# Patient Record
Sex: Female | Born: 1935 | Race: White | Hispanic: No | Marital: Married | Smoking: Never smoker
Health system: Southern US, Community
[De-identification: ages and names within clinical notes are randomized; demographics above are authoritative.]

## PROBLEM LIST (undated history)

## (undated) DIAGNOSIS — I10 Essential (primary) hypertension: Secondary | ICD-10-CM

## (undated) DIAGNOSIS — R200 Anesthesia of skin: Secondary | ICD-10-CM

## (undated) DIAGNOSIS — M2550 Pain in unspecified joint: Secondary | ICD-10-CM

## (undated) DIAGNOSIS — E785 Hyperlipidemia, unspecified: Secondary | ICD-10-CM

## (undated) DIAGNOSIS — K5790 Diverticulosis of intestine, part unspecified, without perforation or abscess without bleeding: Secondary | ICD-10-CM

## (undated) DIAGNOSIS — D649 Anemia, unspecified: Secondary | ICD-10-CM

## (undated) DIAGNOSIS — M8580 Other specified disorders of bone density and structure, unspecified site: Secondary | ICD-10-CM

## (undated) DIAGNOSIS — Z9889 Other specified postprocedural states: Secondary | ICD-10-CM

## (undated) DIAGNOSIS — H3530 Unspecified macular degeneration: Secondary | ICD-10-CM

## (undated) DIAGNOSIS — R0989 Other specified symptoms and signs involving the circulatory and respiratory systems: Secondary | ICD-10-CM

## (undated) DIAGNOSIS — K5900 Constipation, unspecified: Secondary | ICD-10-CM

## (undated) DIAGNOSIS — K219 Gastro-esophageal reflux disease without esophagitis: Secondary | ICD-10-CM

## (undated) DIAGNOSIS — K648 Other hemorrhoids: Secondary | ICD-10-CM

## (undated) DIAGNOSIS — Z8669 Personal history of other diseases of the nervous system and sense organs: Secondary | ICD-10-CM

## (undated) DIAGNOSIS — R351 Nocturia: Secondary | ICD-10-CM

## (undated) DIAGNOSIS — R3915 Urgency of urination: Secondary | ICD-10-CM

## (undated) DIAGNOSIS — M2540 Effusion, unspecified joint: Secondary | ICD-10-CM

## (undated) DIAGNOSIS — I809 Phlebitis and thrombophlebitis of unspecified site: Secondary | ICD-10-CM

## (undated) DIAGNOSIS — R350 Frequency of micturition: Secondary | ICD-10-CM

## (undated) DIAGNOSIS — E039 Hypothyroidism, unspecified: Secondary | ICD-10-CM

## (undated) DIAGNOSIS — M1990 Unspecified osteoarthritis, unspecified site: Secondary | ICD-10-CM

## (undated) DIAGNOSIS — M549 Dorsalgia, unspecified: Secondary | ICD-10-CM

## (undated) DIAGNOSIS — R112 Nausea with vomiting, unspecified: Secondary | ICD-10-CM

## (undated) HISTORY — PX: DILATION AND CURETTAGE OF UTERUS: SHX78

## (undated) HISTORY — PX: OOPHORECTOMY: SHX86

## (undated) HISTORY — PX: OTHER SURGICAL HISTORY: SHX169

## (undated) HISTORY — PX: COLONOSCOPY: SHX174

---
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
## 1994-05-21 HISTORY — PX: OTHER SURGICAL HISTORY: SHX169

## 1996-05-21 HISTORY — PX: OTHER SURGICAL HISTORY: SHX169

## 1997-12-19 HISTORY — PX: BREAST BIOPSY: SHX20

## 2004-05-08 ENCOUNTER — Ambulatory Visit: Payer: Self-pay | Admitting: Family Medicine

## 2004-05-30 ENCOUNTER — Ambulatory Visit: Payer: Self-pay | Admitting: Unknown Physician Specialty

## 2005-03-06 ENCOUNTER — Ambulatory Visit: Payer: Self-pay | Admitting: Family Medicine

## 2005-03-06 IMAGING — US US CAROTID DUPLEX BILAT
1 series · 17 of 24 positions shown · non-contrast
Comparison: none

REASON FOR EXAM: Bruit, follow-up carotid occlusion
COMMENTS:

PROCEDURE:     US  - US CAROTID DOPPLER BILATERAL  - [DATE] [DATE]
RESULT:   Doppler interrogation of the carotid arteries is performed in the
standard fashion.

[Series 1: us carotid duplex bilat · 17 of 92 slices shown]
[im 1/92]
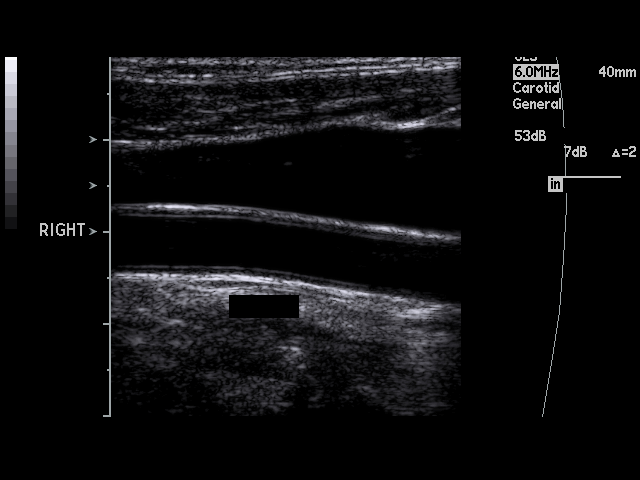
[im 8/92]
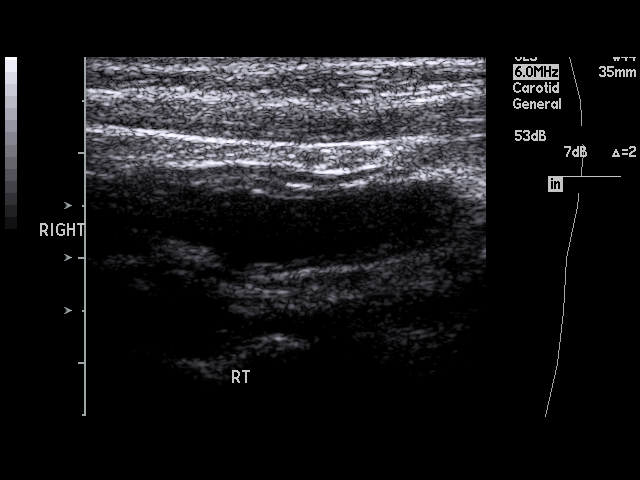
[im 12/92]
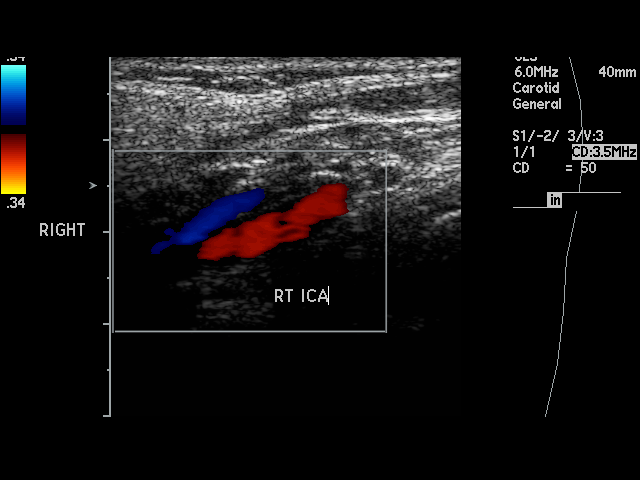
[im 16/92]
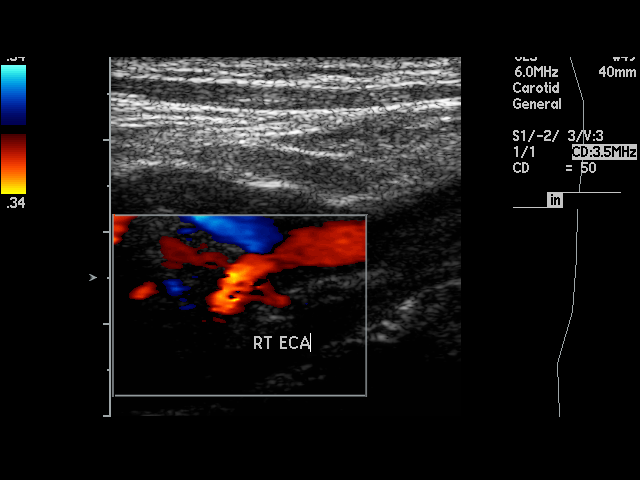
[im 24/92]
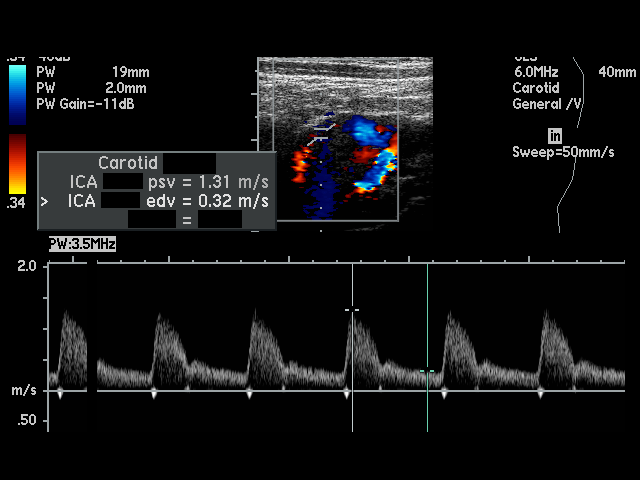
[im 28/92]
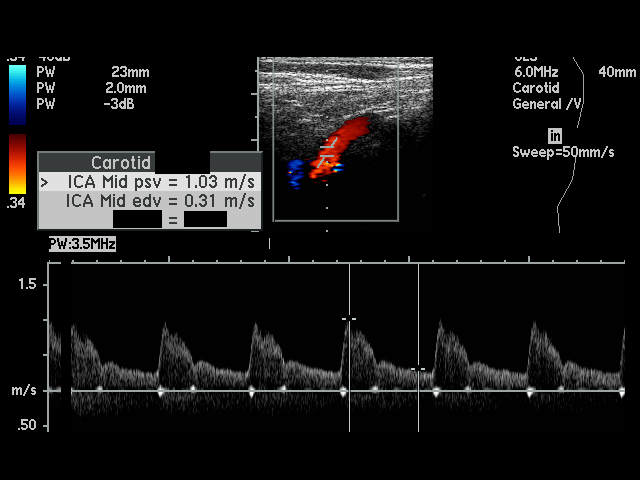
[im 36/92]
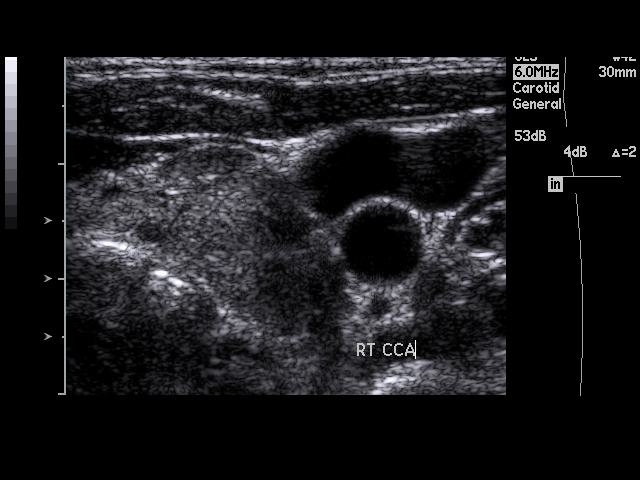
[im 40/92]
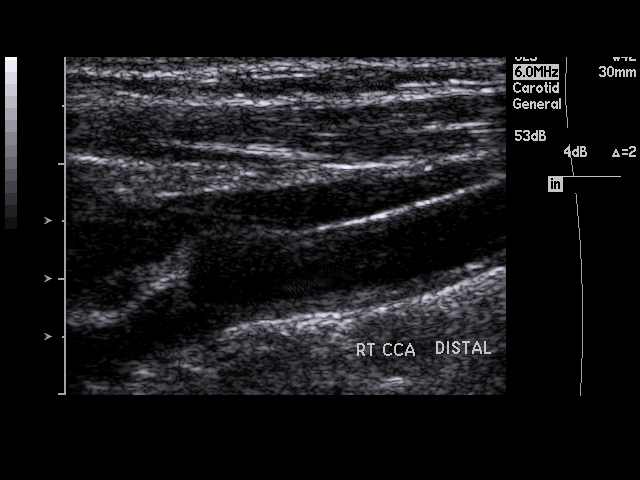
[im 48/92]
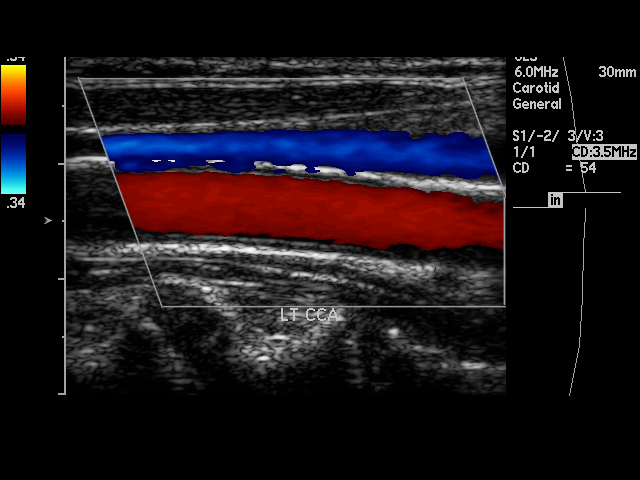
[im 52/92]
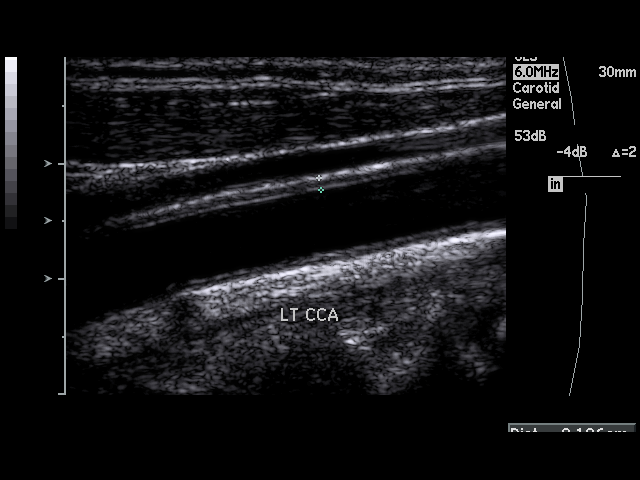
[im 56/92]
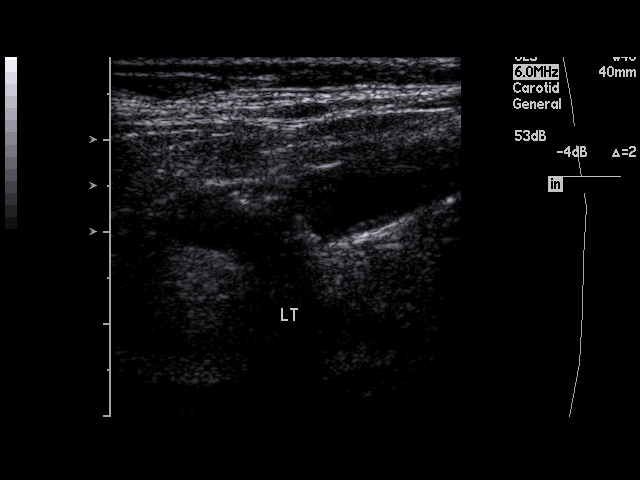
[im 64/92]
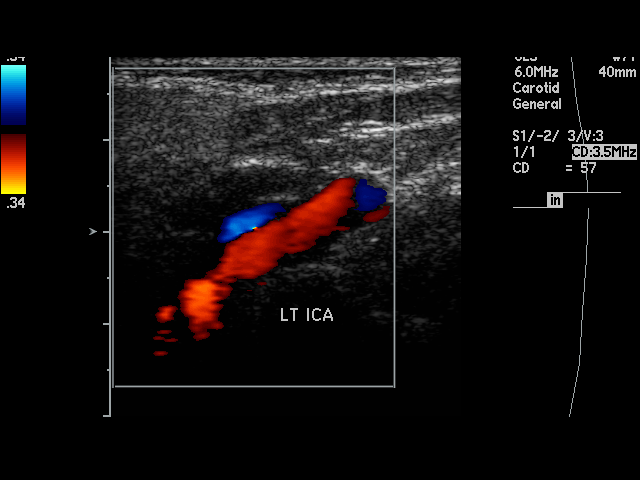
[im 68/92]
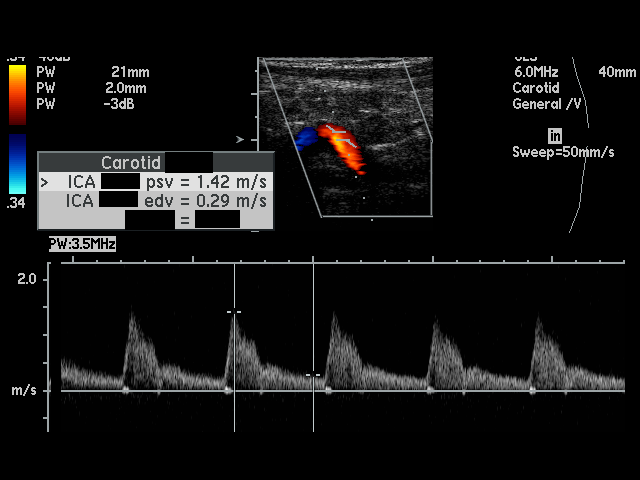
[im 76/92]
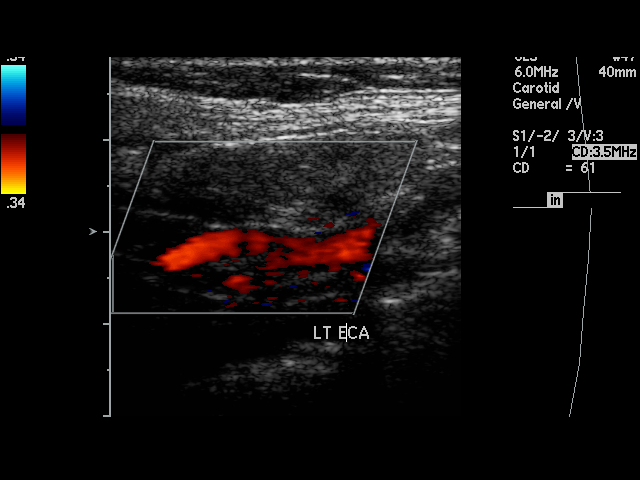
[im 80/92]
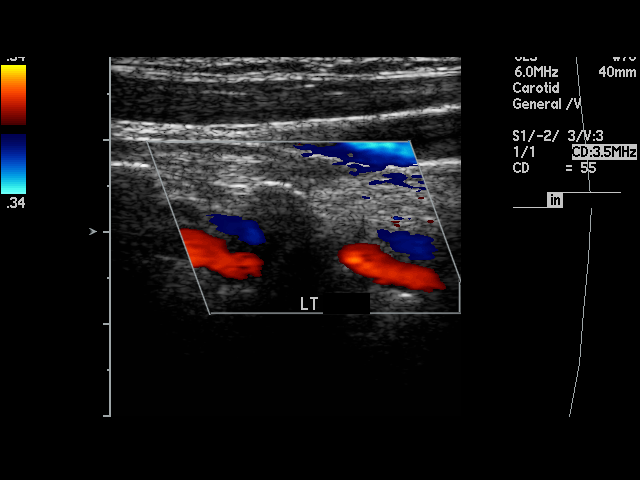
[im 84/92]
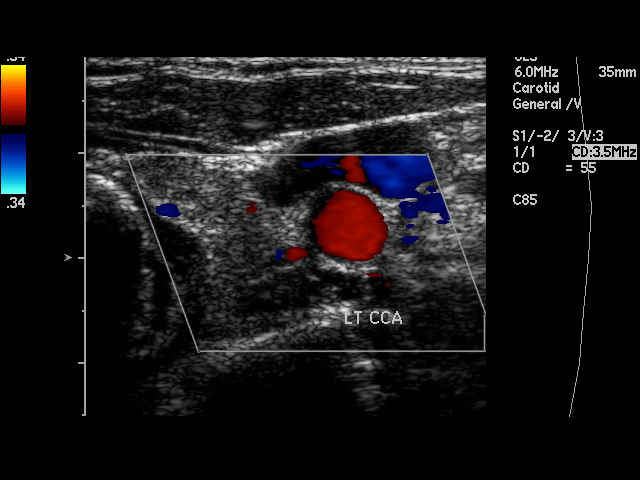
[im 92/92]
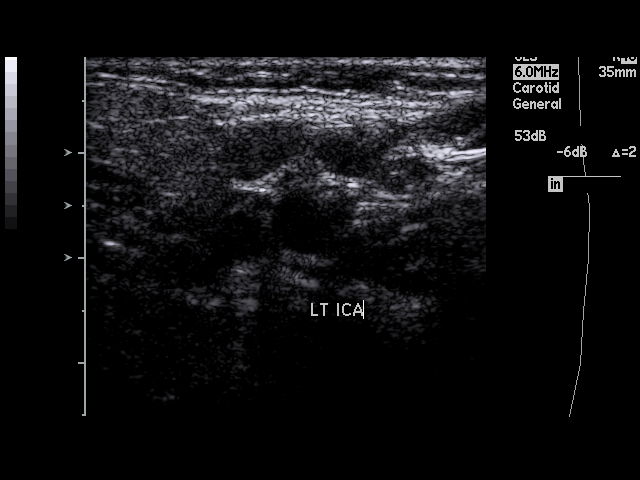

[17 of 24 positions shown; findings below may reference images not displayed]

FINDINGS: The images demonstrate areas of calcified plaque especially in the
LEFT carotid bifurcation and internal carotid artery. Some soft,
noncalcified plaque is seen in the RIGHT carotid bulb. Visually these
changes do not appear to cause significant stenosis. Antegrade flow is seen
in both vertebral arteries. The peak systolic velocity ratios are 1.7 on the
RIGHT and 1.02 on the LEFT.
IMPRESSION: Some atherosclerotic calcification, worse on the LEFT than
the RIGHT, and some soft plaque on the RIGHT without definite
hemodynamically significant stenosis.

## 2005-05-17 ENCOUNTER — Ambulatory Visit: Payer: Self-pay | Admitting: Family Medicine

## 2005-10-19 ENCOUNTER — Ambulatory Visit: Payer: Self-pay | Admitting: Family Medicine

## 2005-10-19 IMAGING — CR DG LUMBAR SPINE AP/LAT/OBLIQUES W/ FLEX AND EXT
1 series · 6 of 6 positions shown · non-contrast
Comparison: none

REASON FOR EXAM: Pain
COMMENTS:

[Series 1: view not recorded · 0.17mm/px · 6 of 6 slices shown]
[im 1/6]
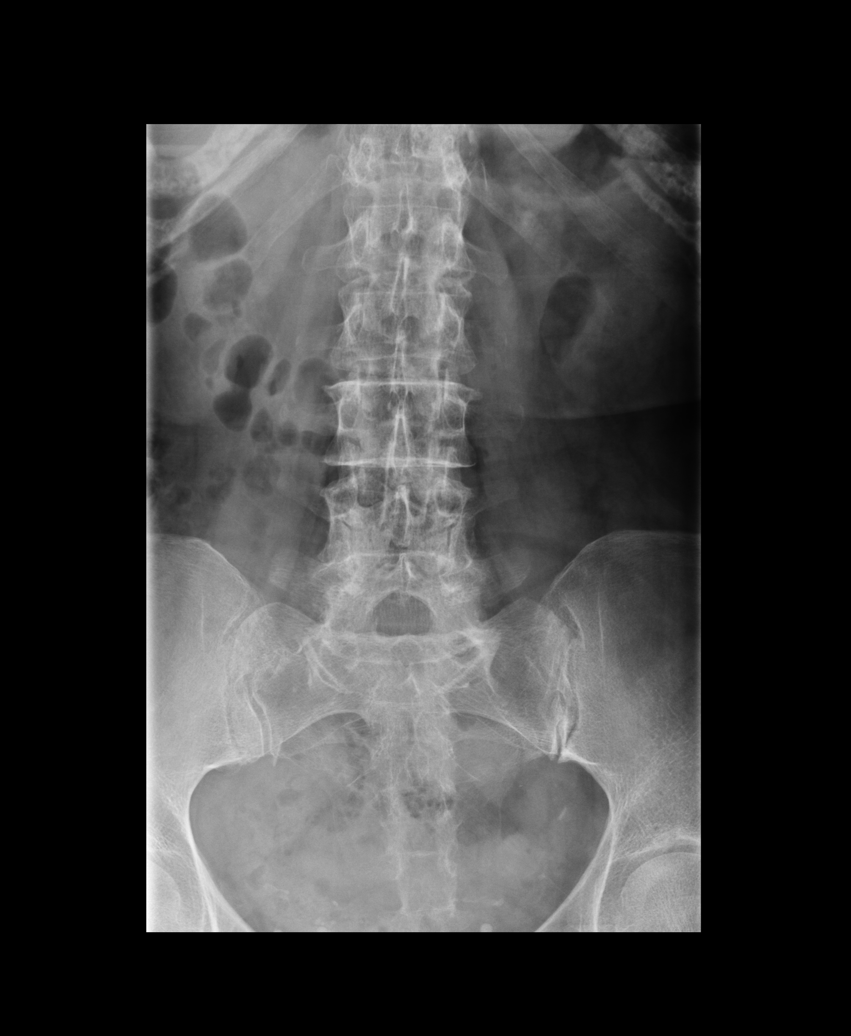
[im 2/6]
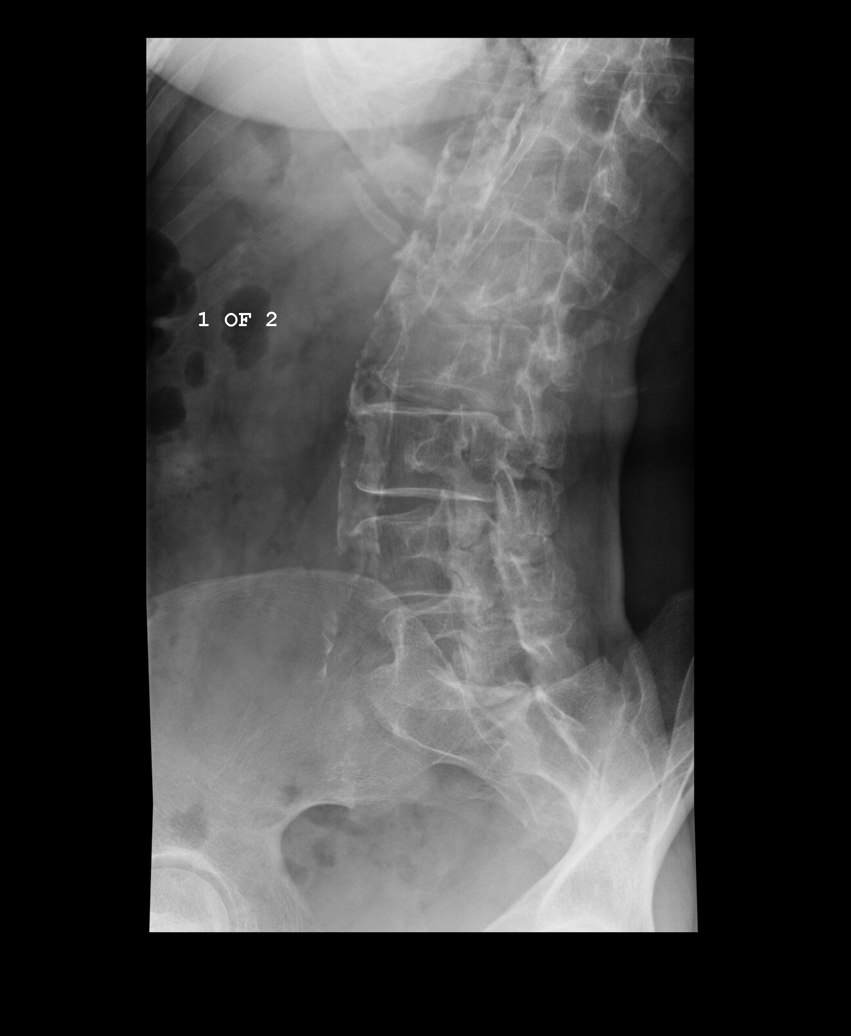
[im 3/6]
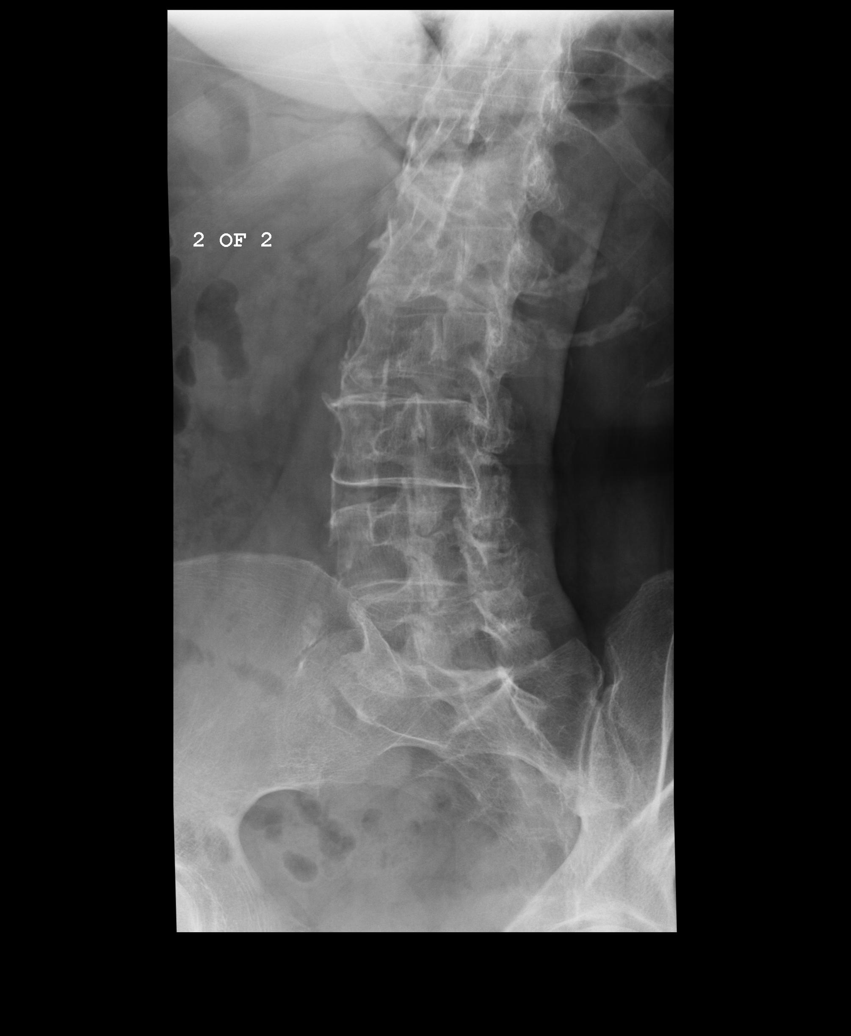
[im 4/6]
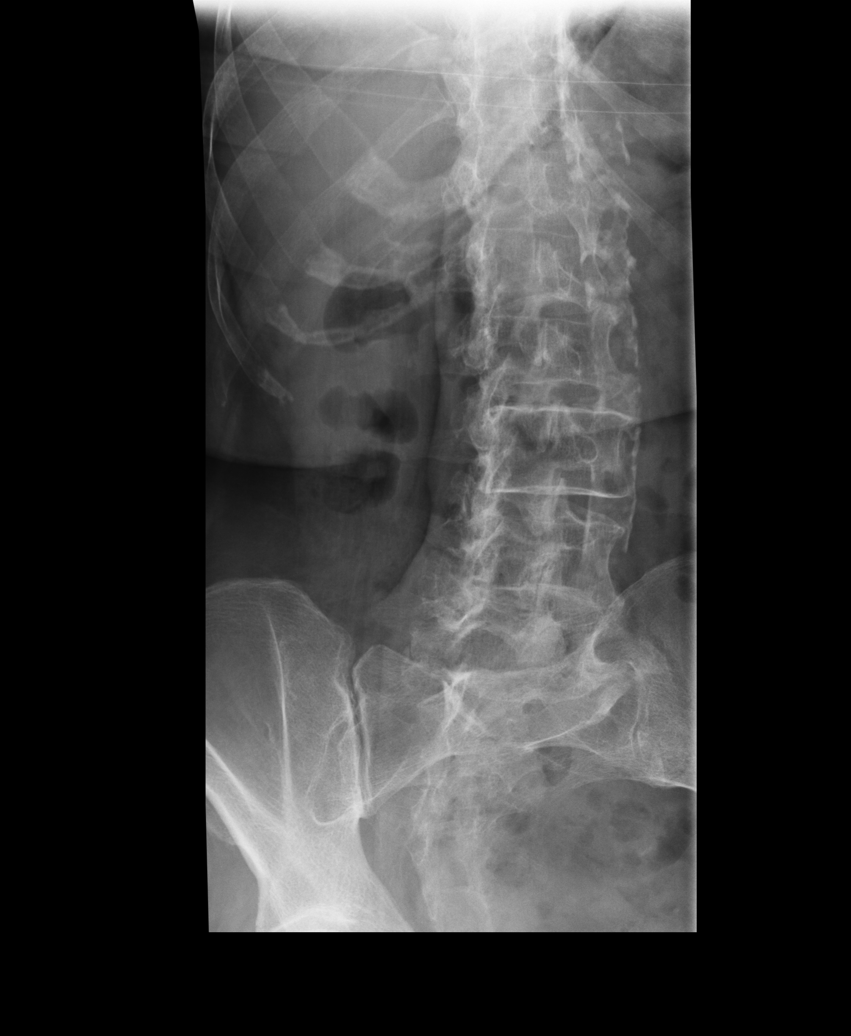
[im 5/6]
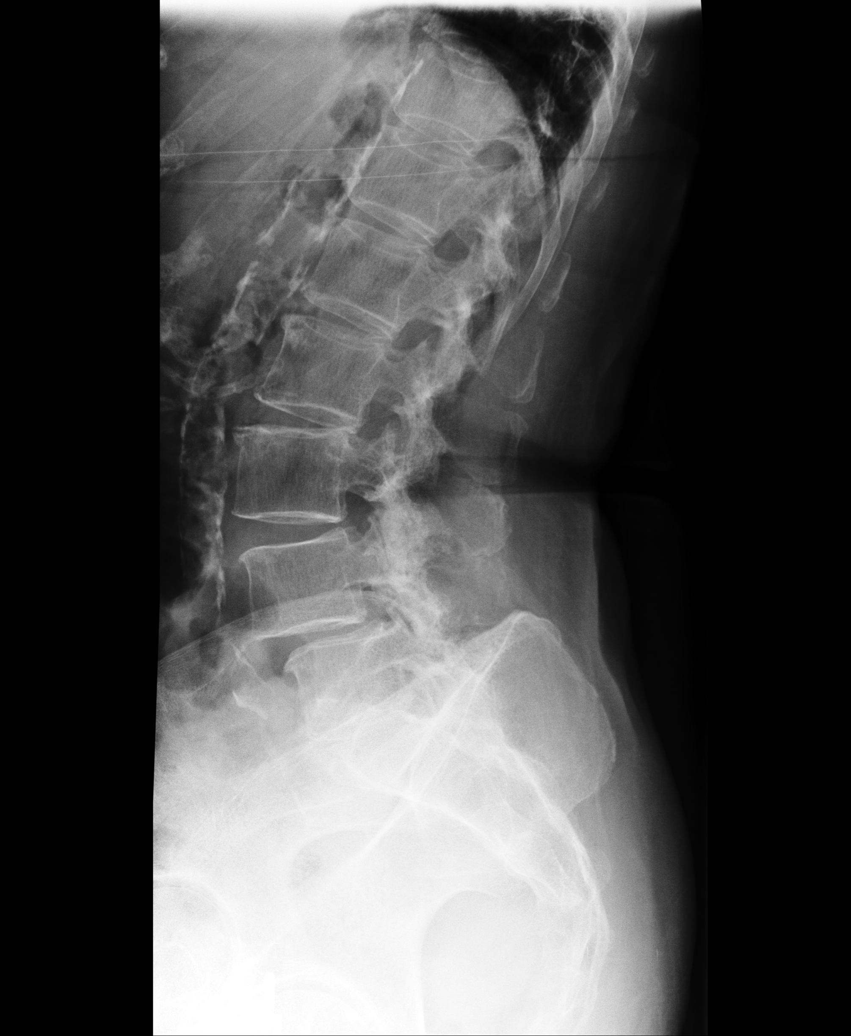
[im 6/6]
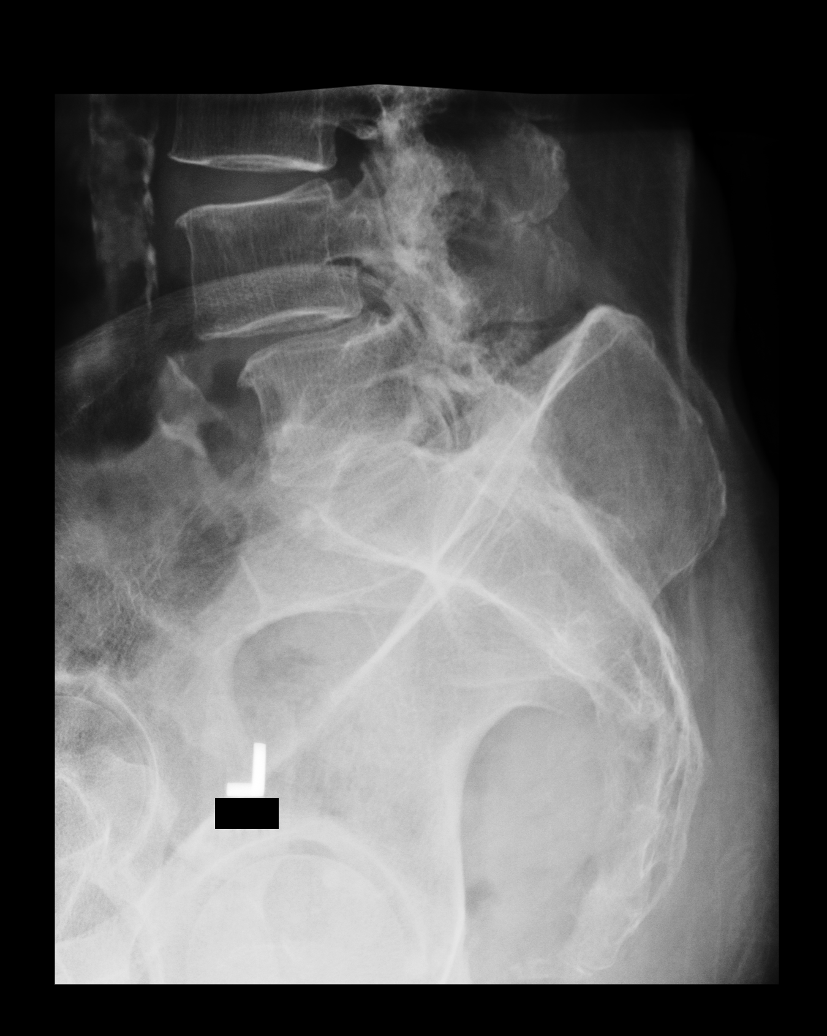

[6 of 6 positions shown; findings below may reference images not displayed]

PROCEDURE:     DXR - DXR LUMBAR SPINE WITH OBLIQUES  - [DATE]  [DATE]

RESULT:          AP, lateral and oblique views of the lumbar spine show the
vertebral body heights to be well maintained.  There is approximately 1.2 cm
anterolisthesis of L4 on L5.  No associated fracture is seen, which suggests
this finding is on a degenerative basis.  There is noted narrowing of the
L4-L5 disc space compatible with disc disease.  Also noted is narrowing of
the L5-S1 disc space consistent with disc disease.  There is sclerosis of
the articular facets at the L4-L5-S1 level compatible with arthritic change.
 The pedicles are bilaterally intact.
IMPRESSION: 1.     No fracture is seen.
2.     There is spondylolisthesis without spondylolysis at L4-L5 as noted
above.
3.     There is narrowing of the intervertebral disc spaces at L4-L5 and
L5-S1 consistent with disc disease.
4.     There is sclerosis of the articular facets at L4-L5 and L5-S1
consistent with arthritic change.
5.     Incidental note is made of atherosclerotic calcification in the
abdominal aorta.

## 2006-01-29 ENCOUNTER — Ambulatory Visit: Payer: Self-pay | Admitting: Family Medicine

## 2006-01-29 IMAGING — US ABDOMEN ULTRASOUND
1 series · 17 of 25 positions shown · non-contrast
Comparison: none

REASON FOR EXAM: palpable liver edge eval pancreas liver spleen
COMMENTS:

[Series 1: abdomen ultrasound · 17 of 62 slices shown]
[im 1/62]
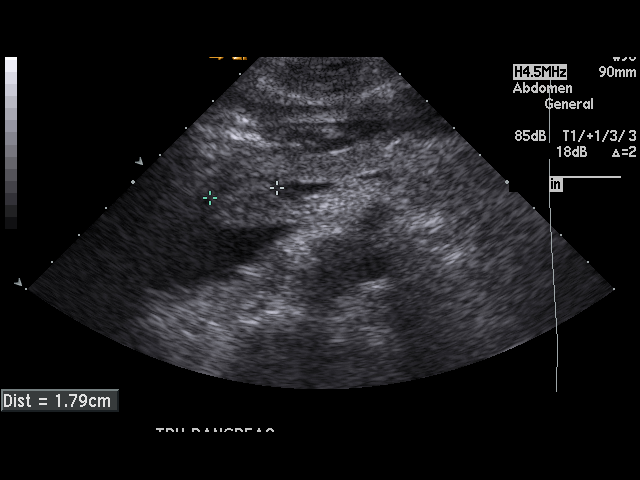
[im 6/62]
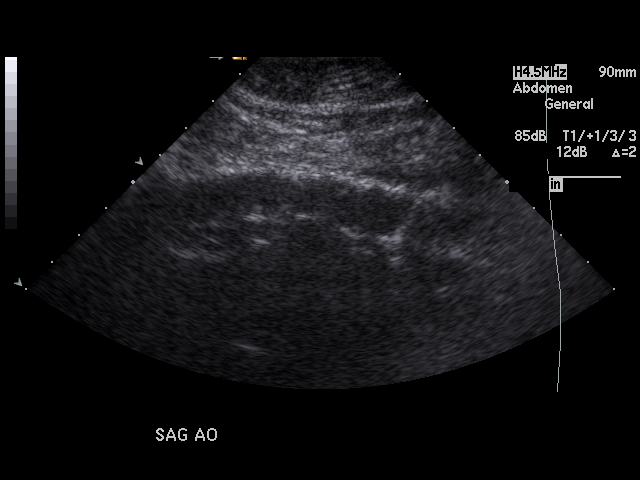
[im 8/62]
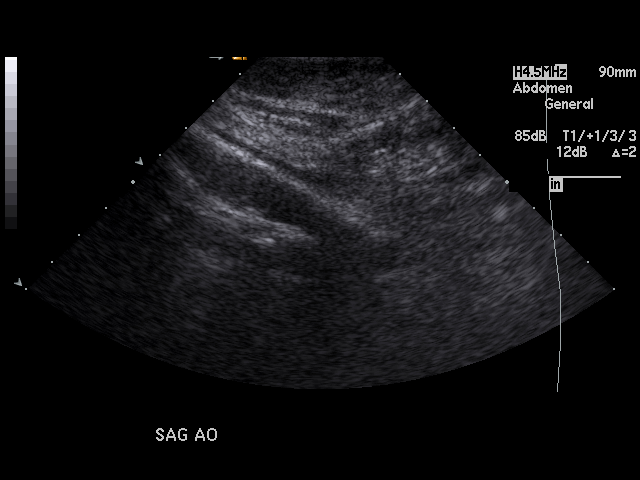
[im 13/62]
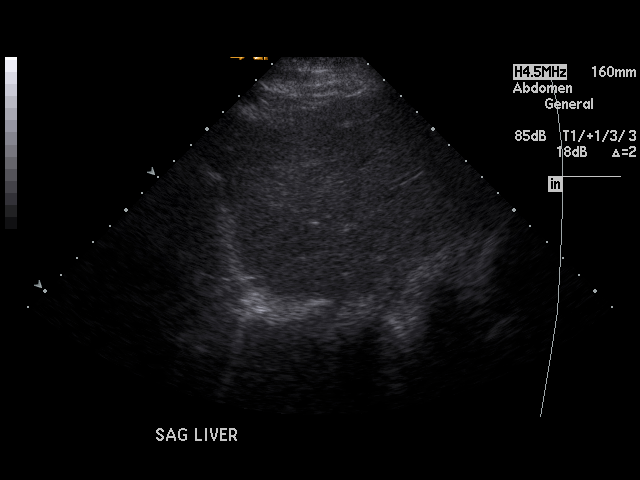
[im 16/62]
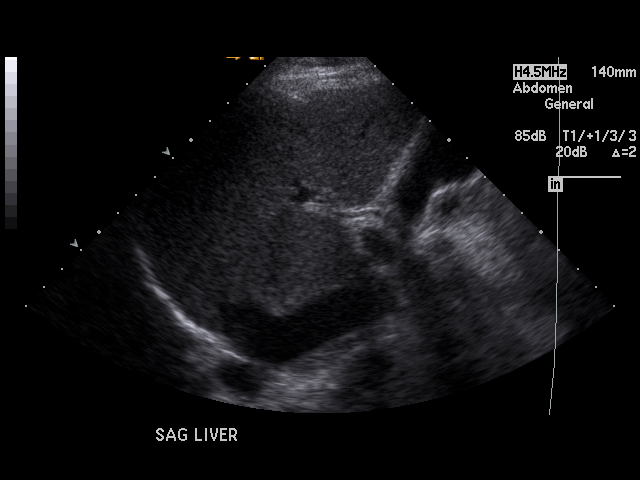
[im 21/62]
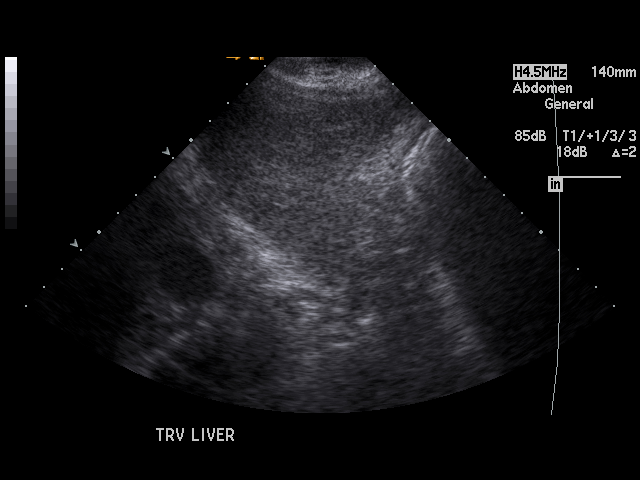
[im 23/62]
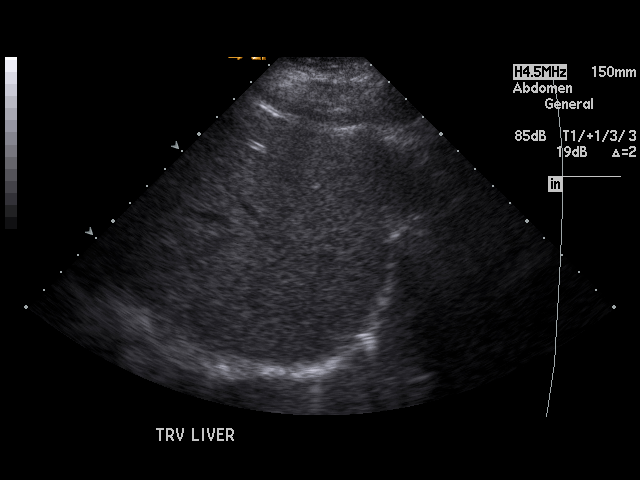
[im 28/62]
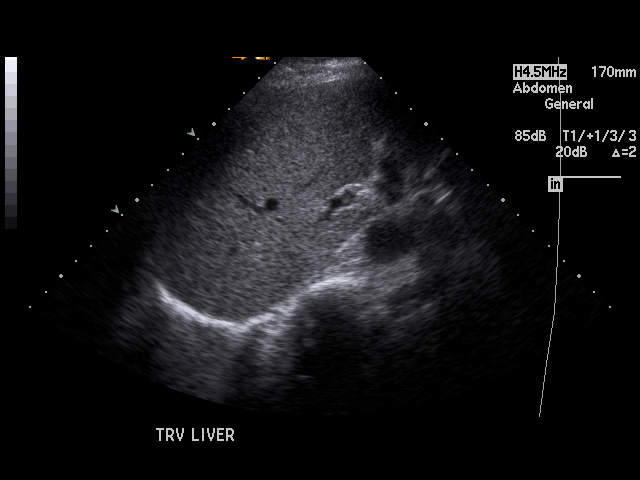
[im 31/62]
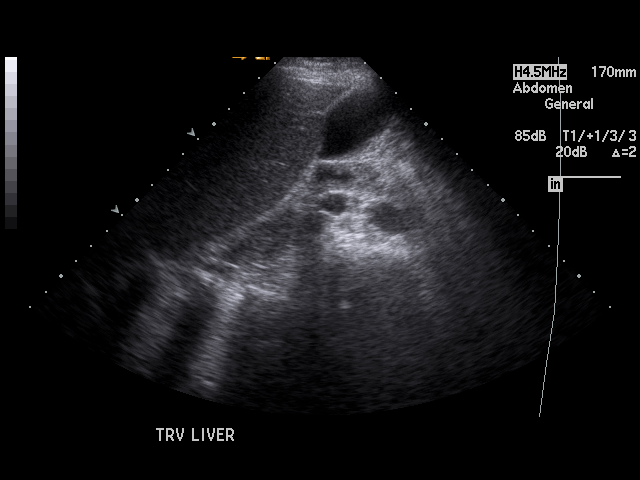
[im 34/62]
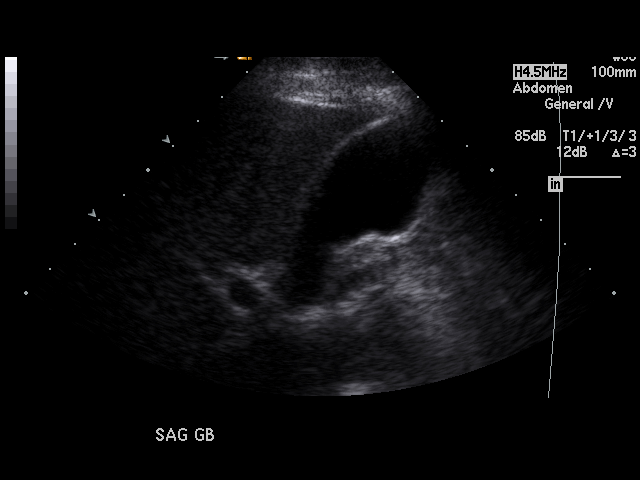
[im 39/62]
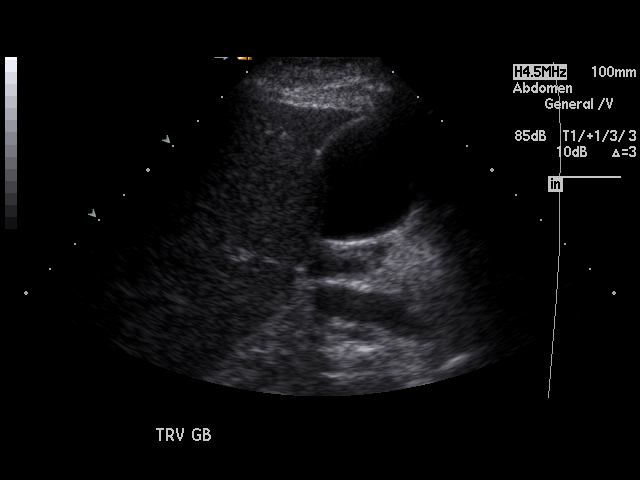
[im 41/62]
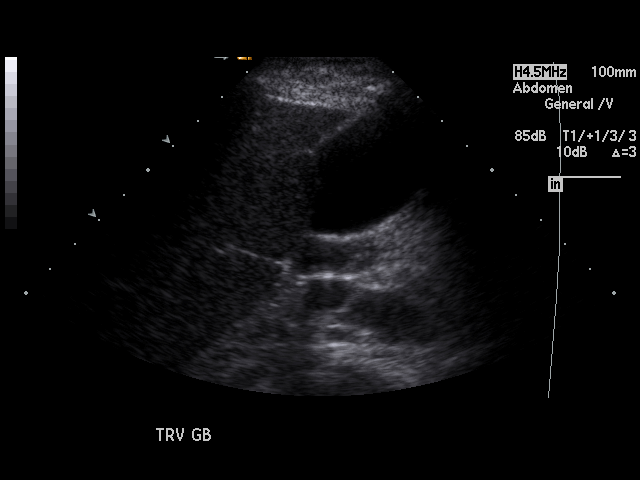
[im 46/62]
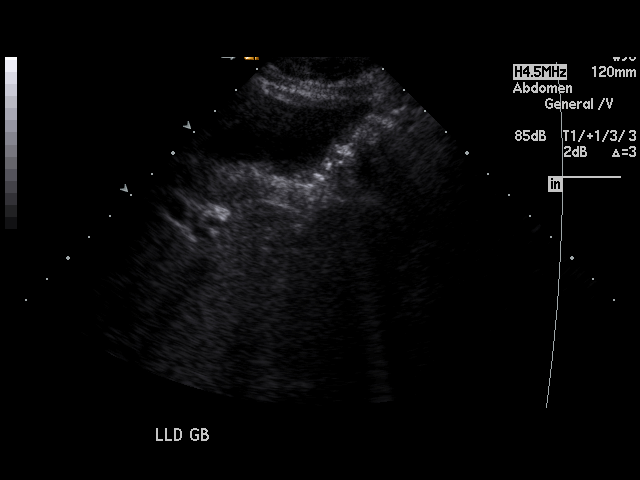
[im 49/62]
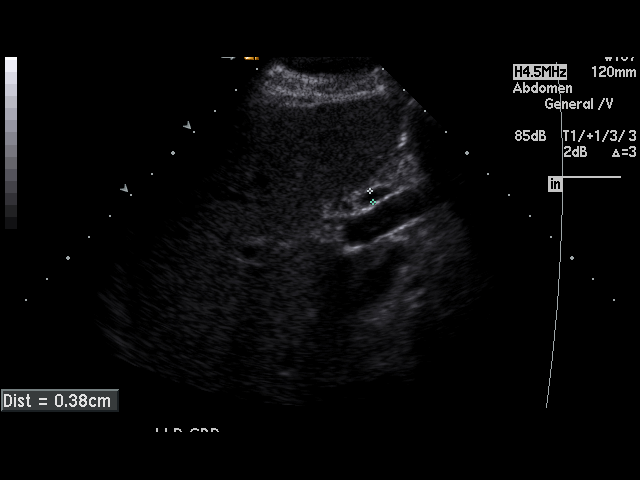
[im 54/62]
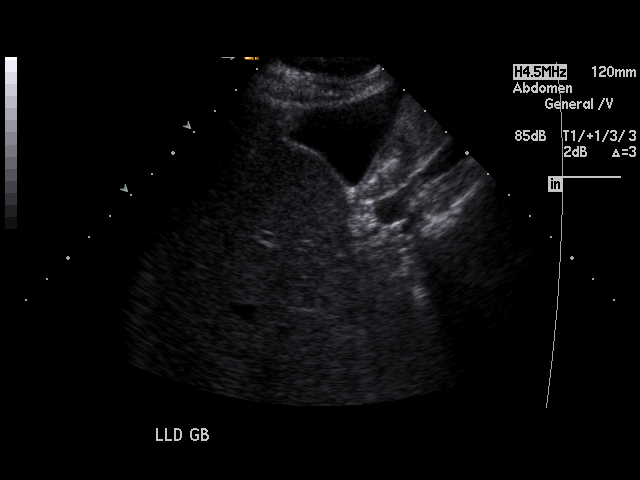
[im 56/62]
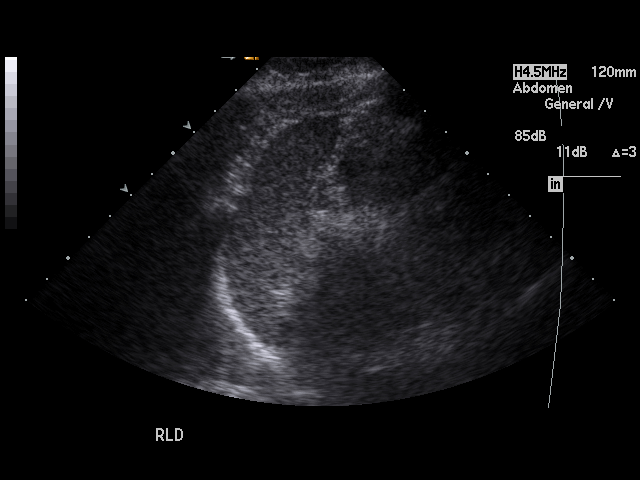
[im 62/62]
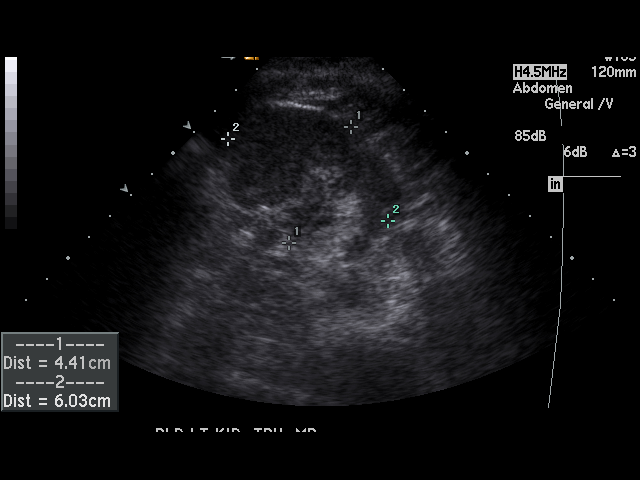

[17 of 25 positions shown; findings below may reference images not displayed]

PROCEDURE:     US  - US ABDOMEN GENERAL SURVEY  - [DATE]  [DATE]

RESULT:     The liver, spleen, and pancreas are normal in appearance. No
gallstones are seen. There is no thickening of the gallbladder wall. The
common bile duct measures 3.8 mm in diameter, which is within normal limits.
The kidneys show no hydronephrosis.  There is a 2.52 cm parapelvic cyst on
the LEFT.
IMPRESSION: 1)No significant sonographic abnormalities of the liver are identified.
Specifically, no hepatic mass is identified and the hepatic echo pattern is
homogenous.

2)No gallstones are seen.

3)There is no ascites.

4)Incidental note is made of a parapelvic cyst of the LEFT kidney.

## 2006-05-20 ENCOUNTER — Ambulatory Visit: Payer: Self-pay | Admitting: Family Medicine

## 2007-02-18 ENCOUNTER — Ambulatory Visit: Payer: Self-pay | Admitting: Family Medicine

## 2007-02-18 IMAGING — RF DG UGI W/ KUB
1 series · 15 of 24 positions shown · non-contrast
Comparison: none

REASON FOR EXAM: Dysphagia
COMMENTS:

[Series 1: run · 15 of 26 slices shown]
[im 1/26]
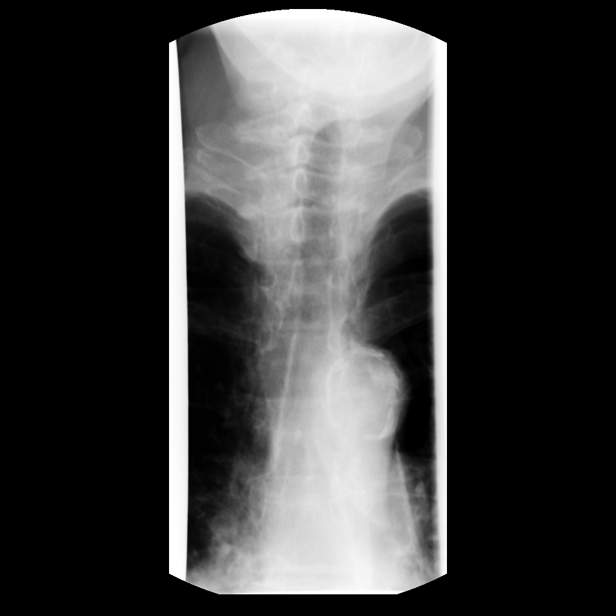
[im 3/26]
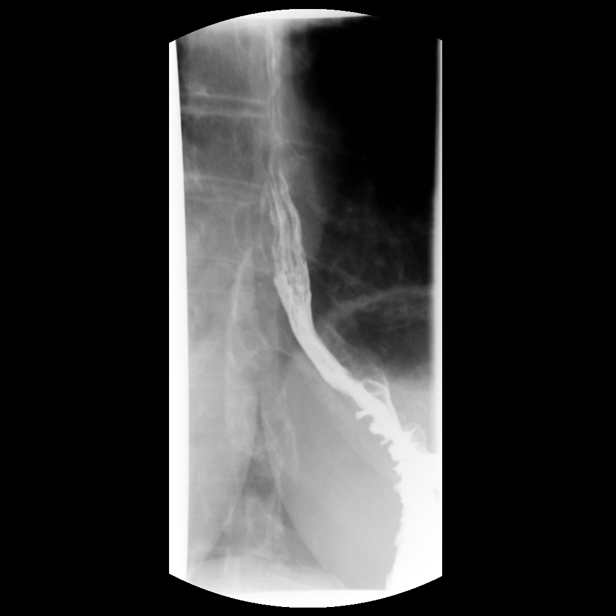
[im 5/26]
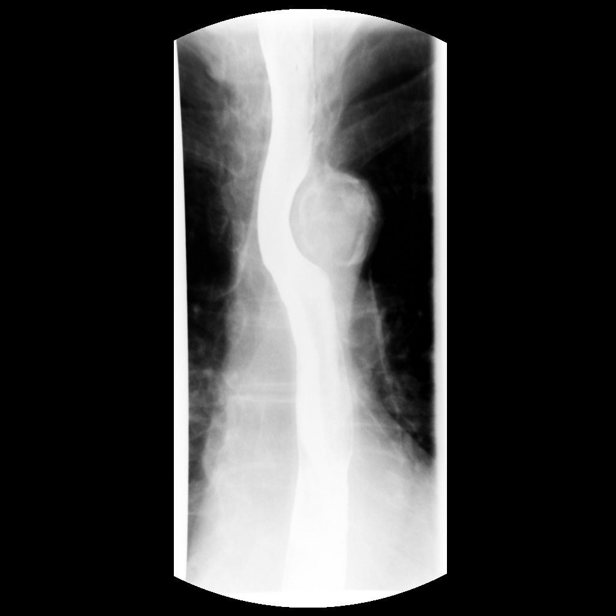
[im 6/26]
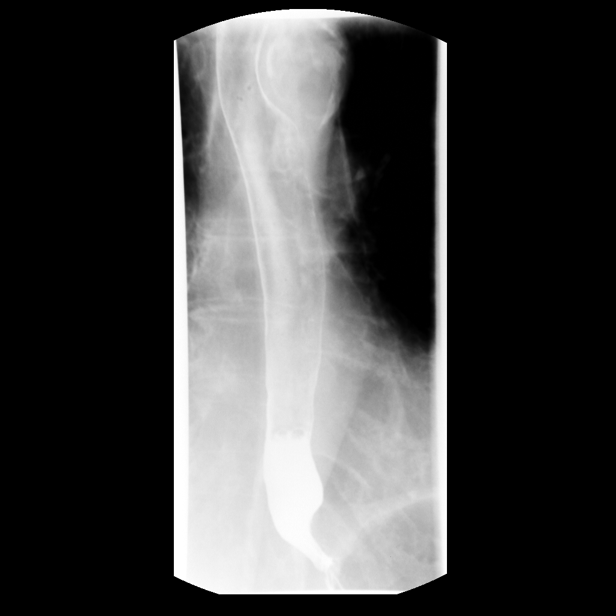
[im 8/26]
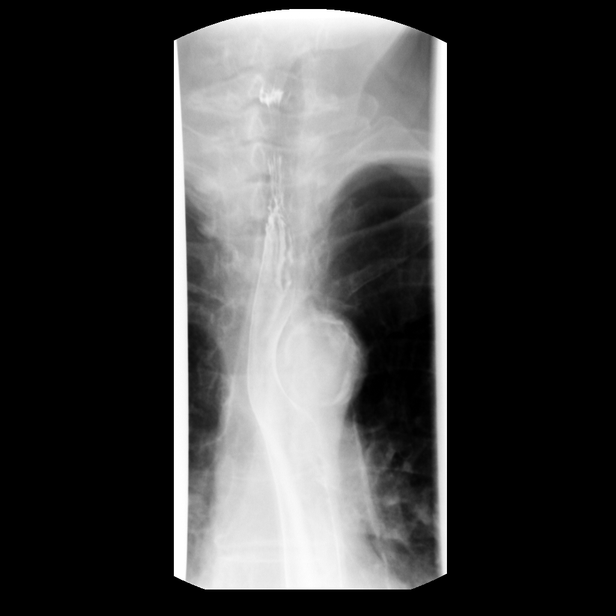
[im 9/26]
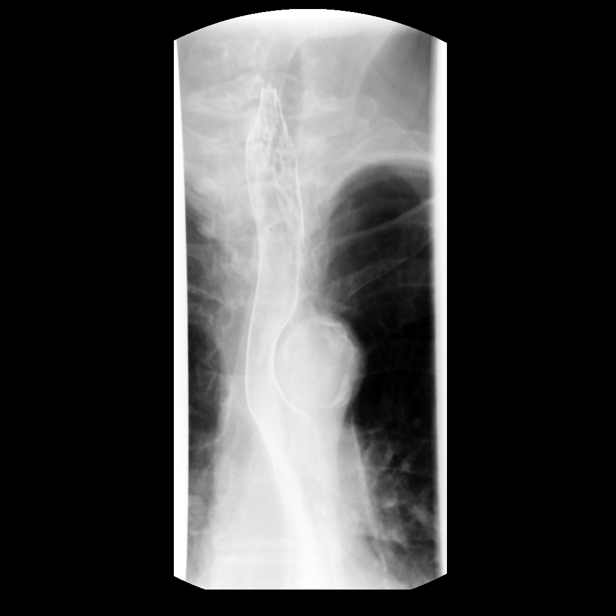
[im 11/26]
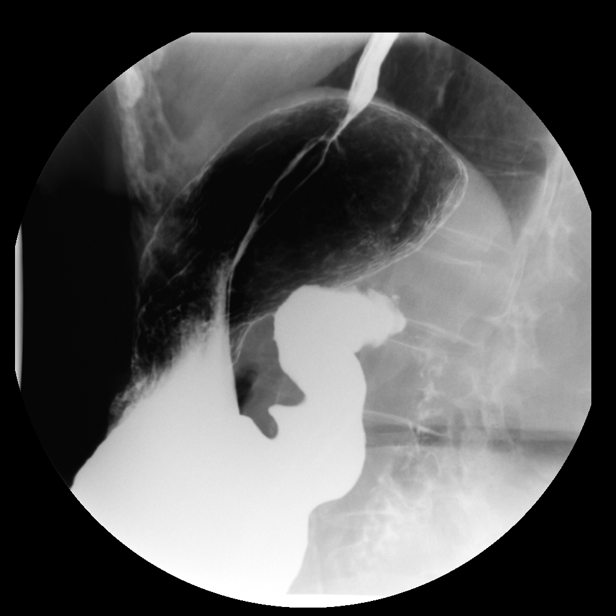
[im 14/26]
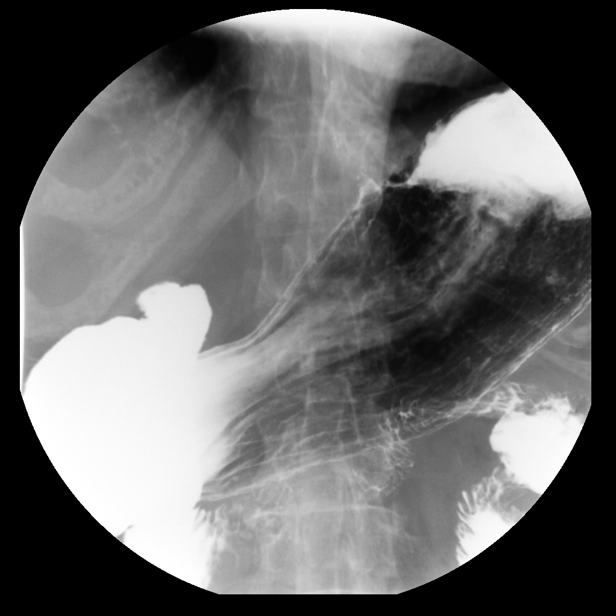
[im 15/26]
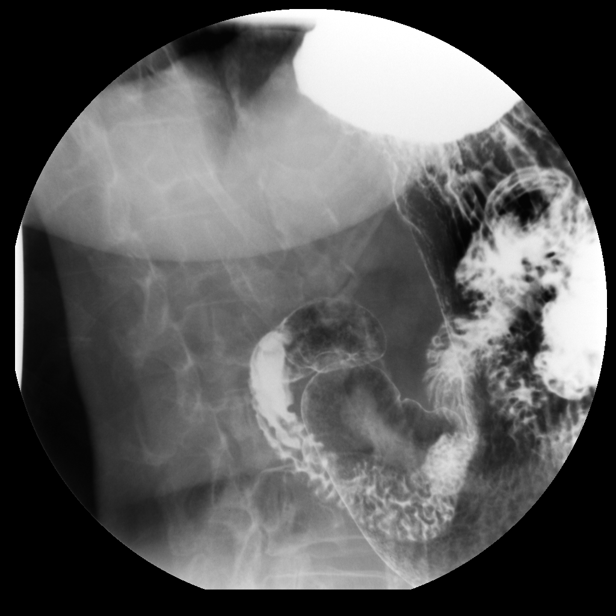
[im 17/26]
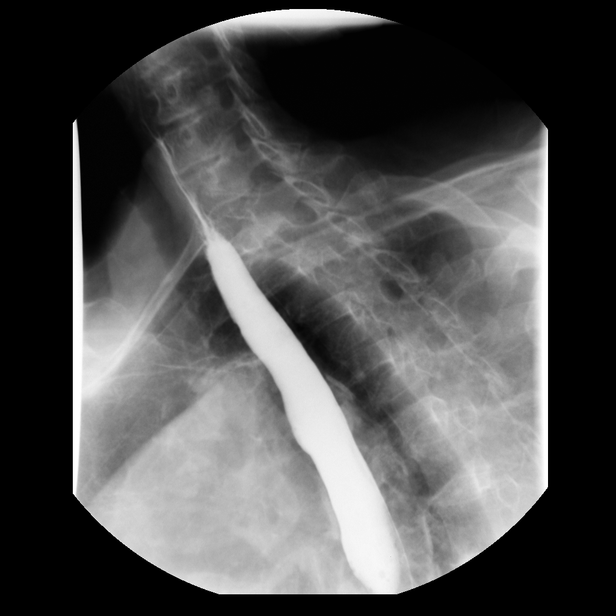
[im 18/26]
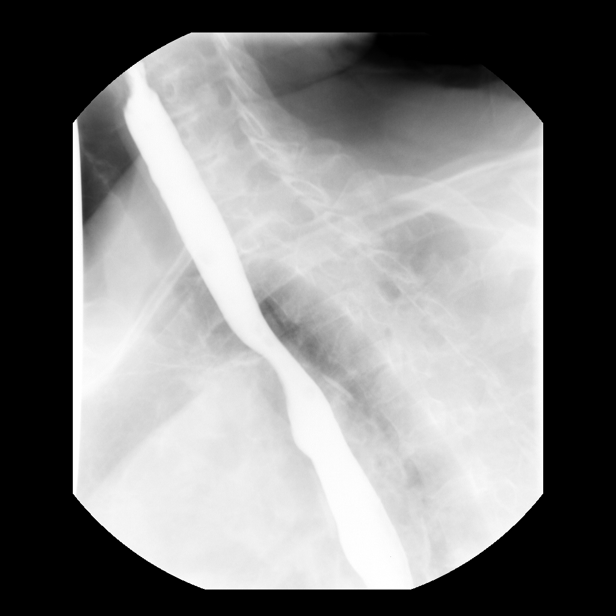
[im 20/26]
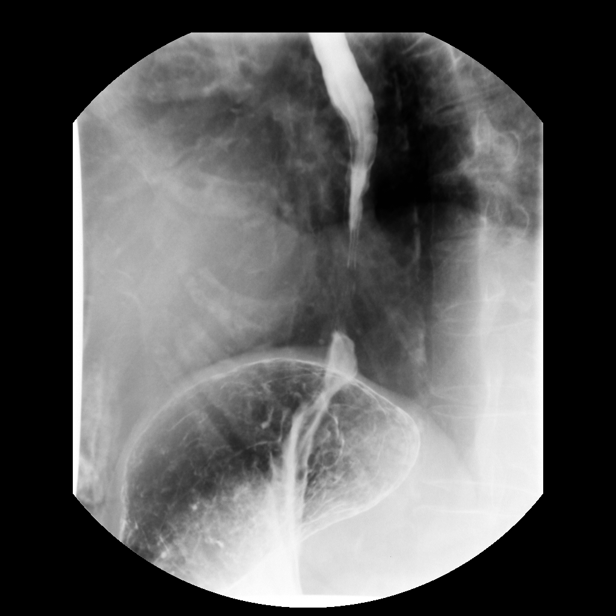
[im 22/26]
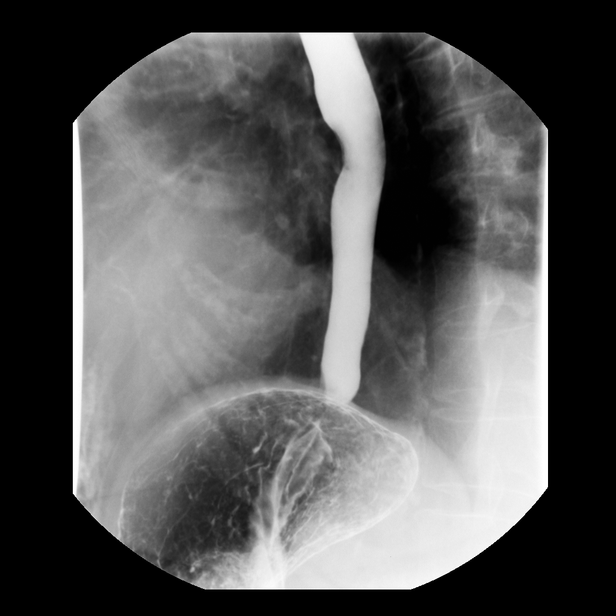
[im 23/26]
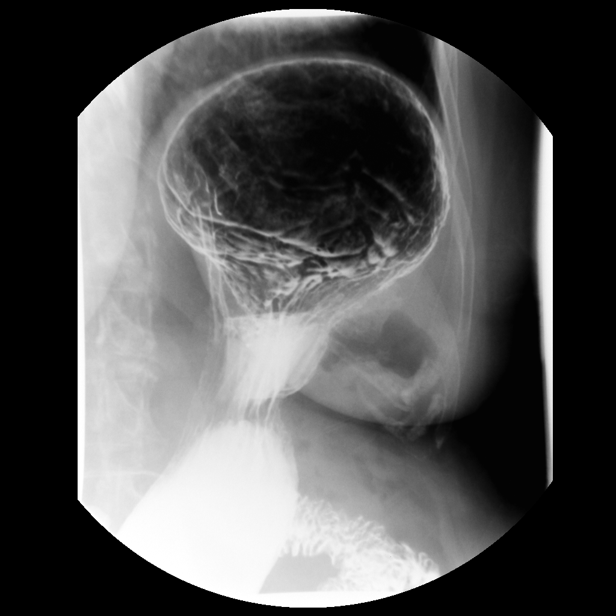
[im 26/26]
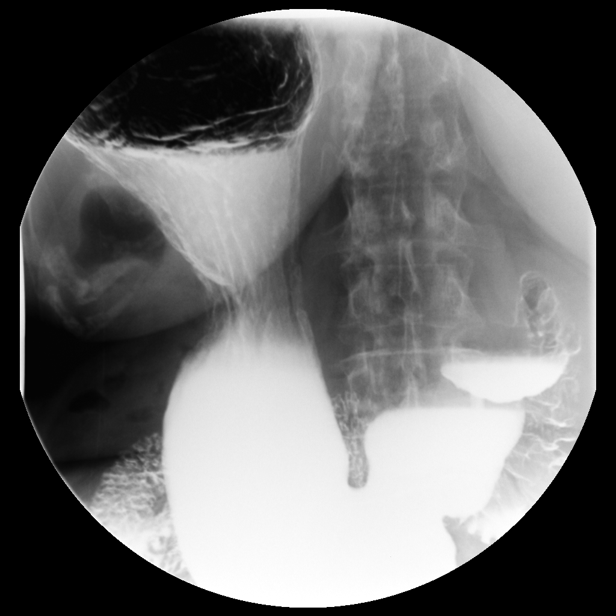

[15 of 24 positions shown; findings below may reference images not displayed]

PROCEDURE:     FL  - FL UPPER GI W/ BARIUM SWALLOW  - [DATE]  [DATE]

RESULT:     Upper GI with barium swallow was performed.

The patient easily ingested the liquid barium. The stomach distended fully.
The gastric and esophageal mucosa appears to be normal. There was no
evidence of a hiatal hernia or gastroesophageal reflux despite utilizing
water siphon test and reflux maneuvers. The 12.5 mm barium impregnated
tablet passed easily through the esophagus into the stomach.
IMPRESSION: Unremarkable barium swallow and upper GI examination. No
esophageal stenosis or mucosal abnormality. Unremarkable appearance of the
stomach and proximal small bowel. Atherosclerotic calcification is noted in
the aorta.

## 2007-02-18 IMAGING — CR DG UGI W/ KUB
1 series · 1 of 1 positions shown · non-contrast
Comparison: none

REASON FOR EXAM: Dysphagia
COMMENTS:

[view not recorded]
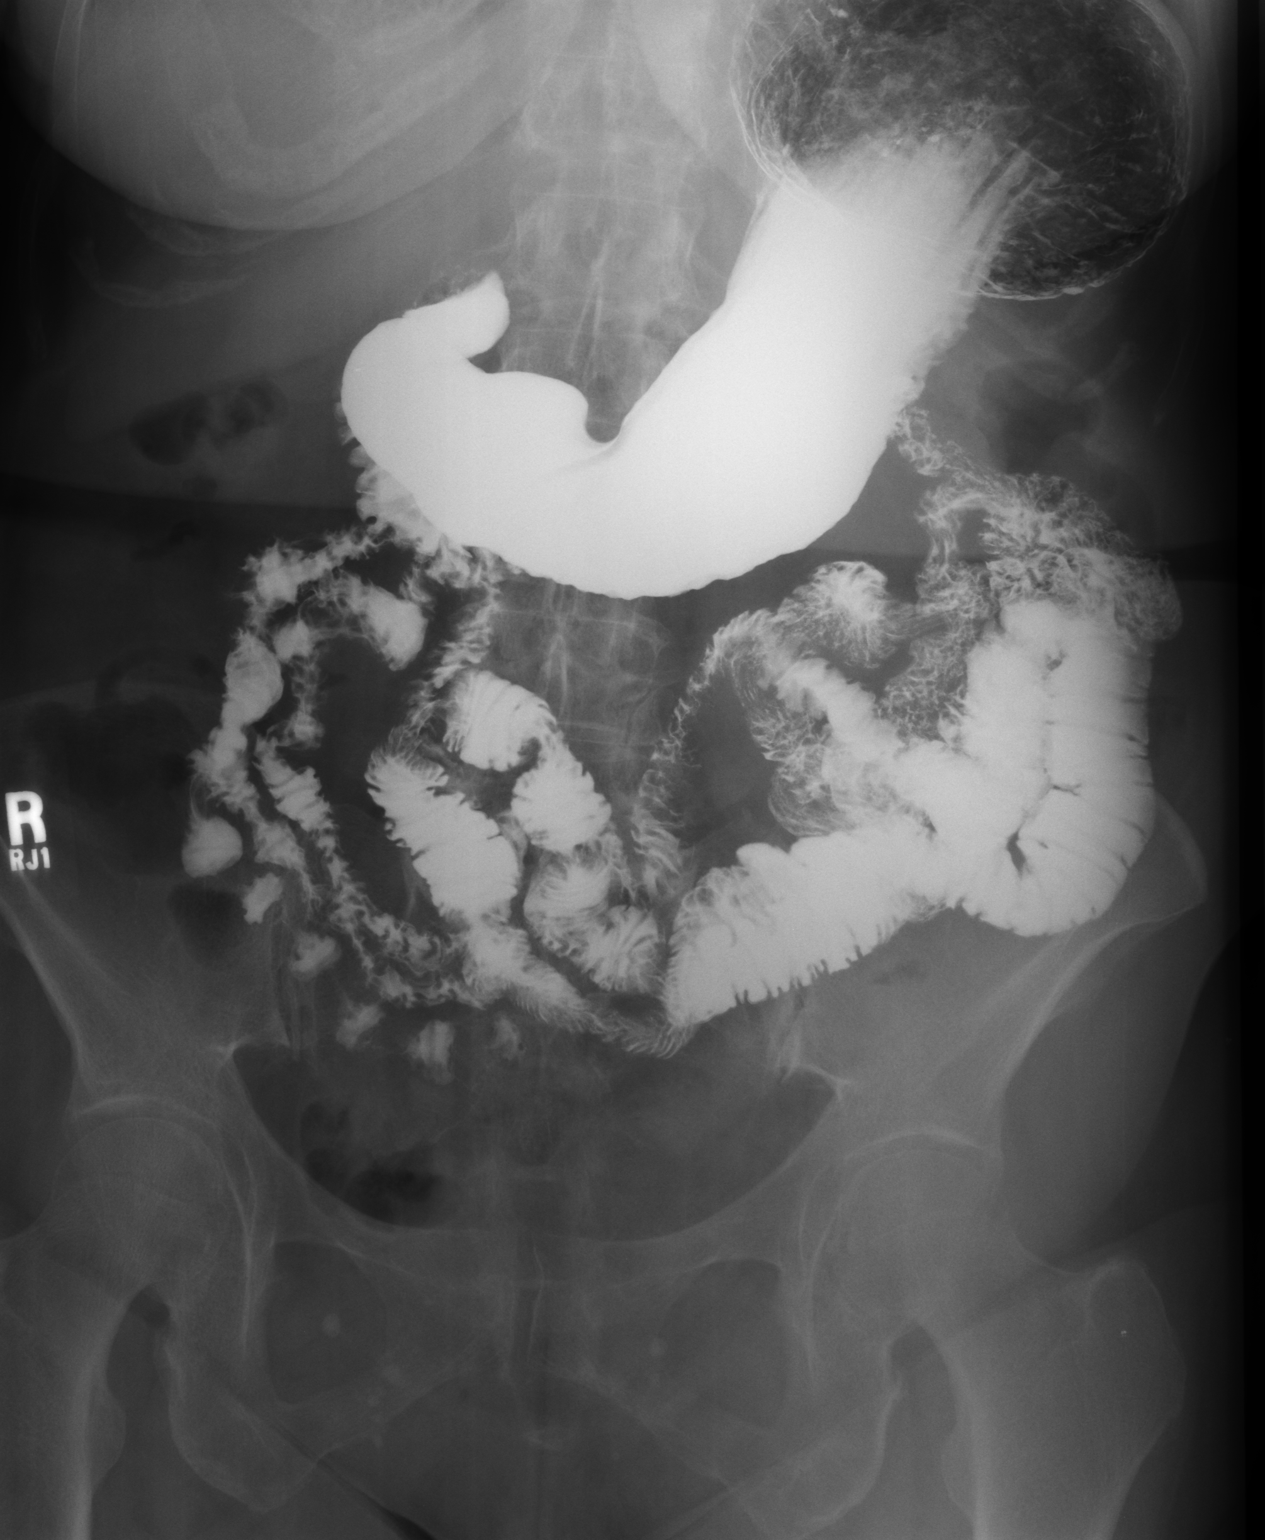

[1 of 1 positions shown; findings below may reference images not displayed]

PROCEDURE:     FL  - FL UPPER GI W/ BARIUM SWALLOW  - [DATE]  [DATE]

RESULT:     Upper GI with barium swallow was performed.

The patient easily ingested the liquid barium. The stomach distended fully.
The gastric and esophageal mucosa appears to be normal. There was no
evidence of a hiatal hernia or gastroesophageal reflux despite utilizing
water siphon test and reflux maneuvers. The 12.5 mm barium impregnated
tablet passed easily through the esophagus into the stomach.
IMPRESSION: Unremarkable barium swallow and upper GI examination. No
esophageal stenosis or mucosal abnormality. Unremarkable appearance of the
stomach and proximal small bowel. Atherosclerotic calcification is noted in
the aorta.

## 2007-07-03 ENCOUNTER — Ambulatory Visit: Payer: Self-pay | Admitting: Obstetrics and Gynecology

## 2008-06-29 ENCOUNTER — Ambulatory Visit: Payer: Self-pay | Admitting: Obstetrics and Gynecology

## 2008-06-29 IMAGING — US US PELV - US TRANSVAGINAL
1 series · 17 of 25 positions shown · non-contrast
Comparison: none

REASON FOR EXAM: HX ovarian CA     F/U cyst
COMMENTS:

[Series 1: us pelv - us transvaginal · 17 of 27 slices shown]
[im 1/27]
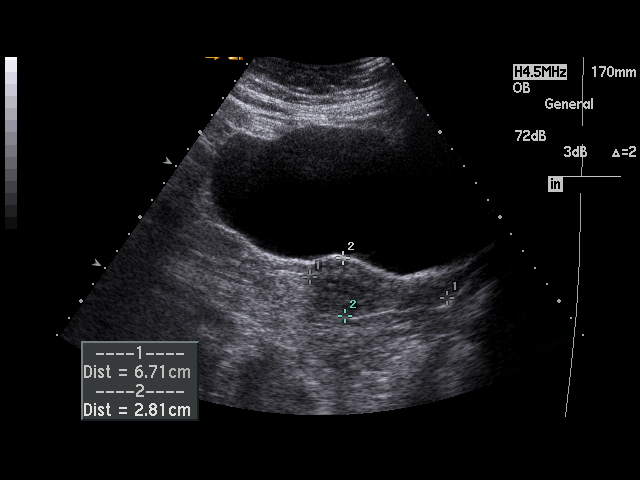
[im 3/27]
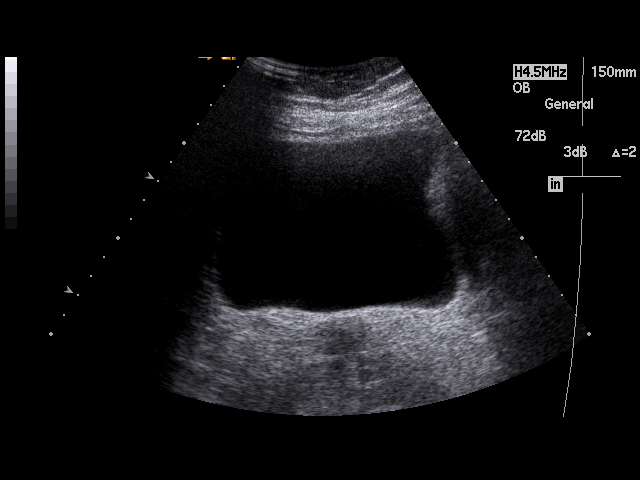
[im 4/27]
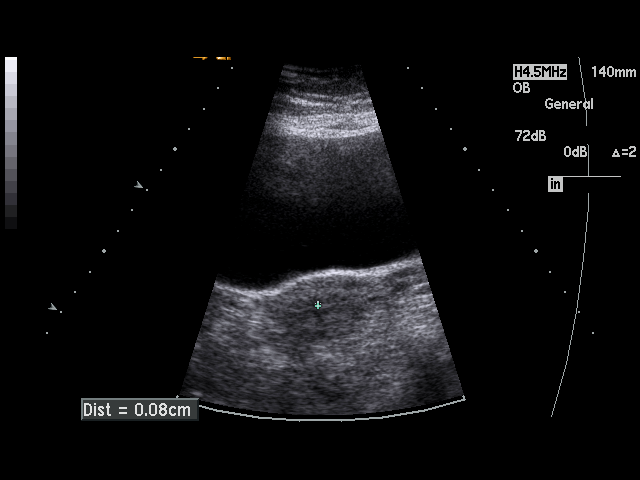
[im 6/27]
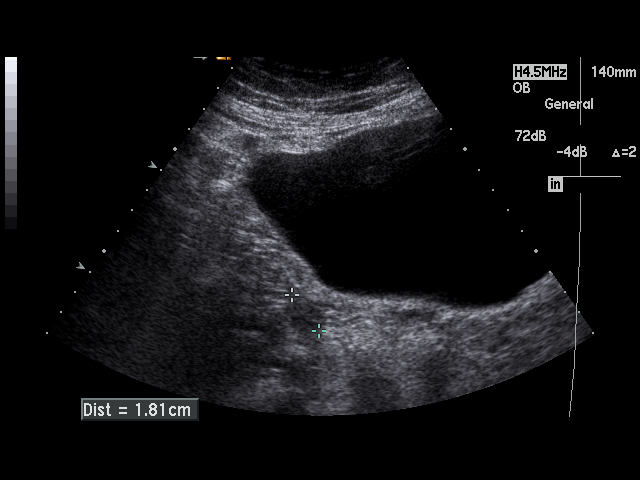
[im 7/27]
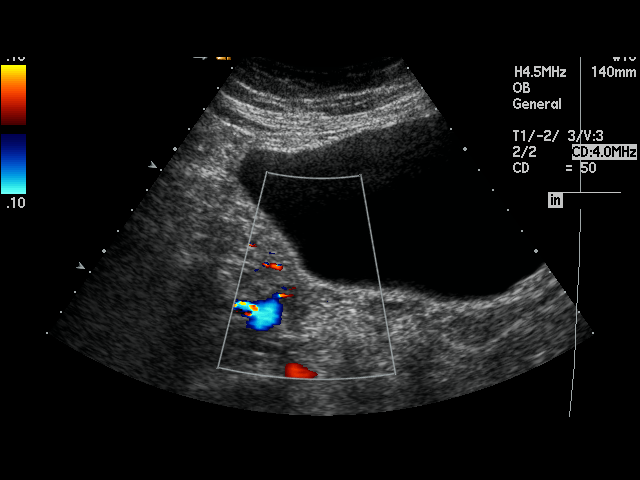
[im 9/27]
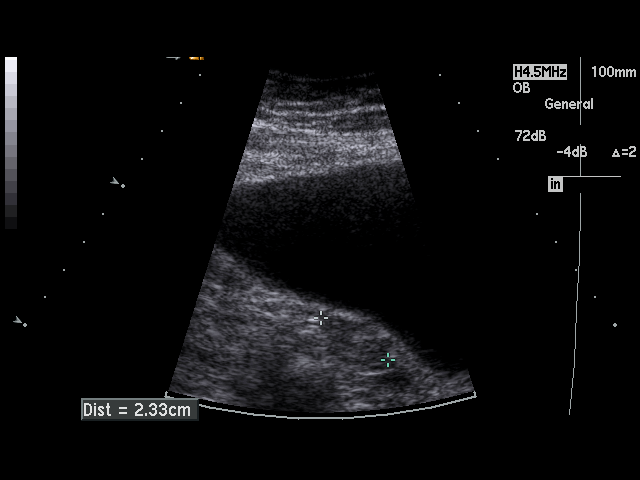
[im 10/27]
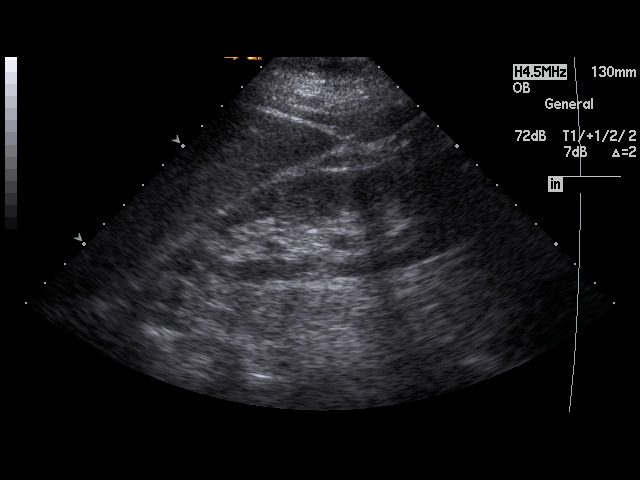
[im 12/27]
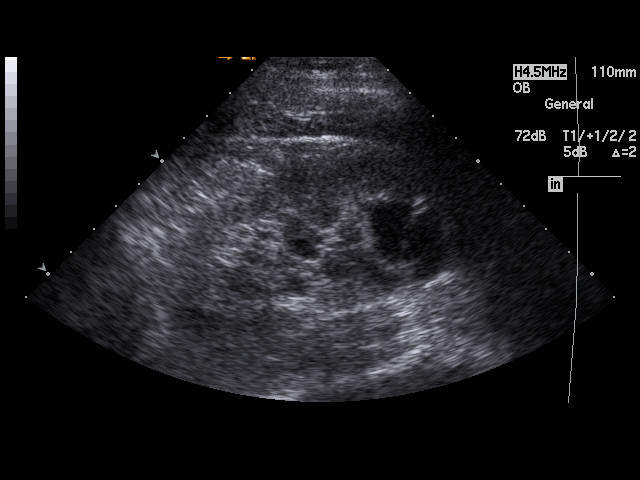
[im 14/27]
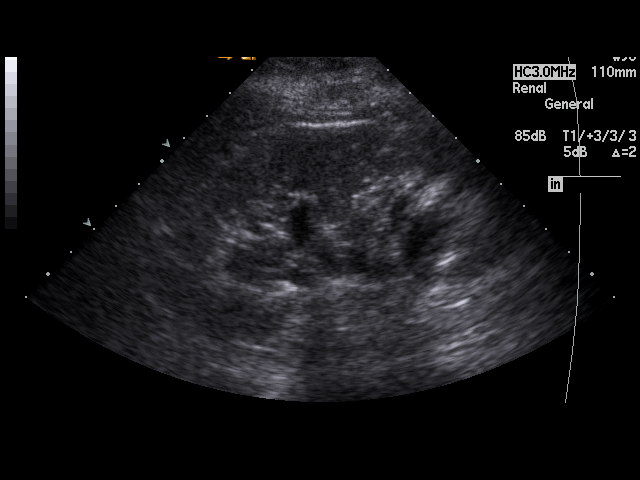
[im 15/27]
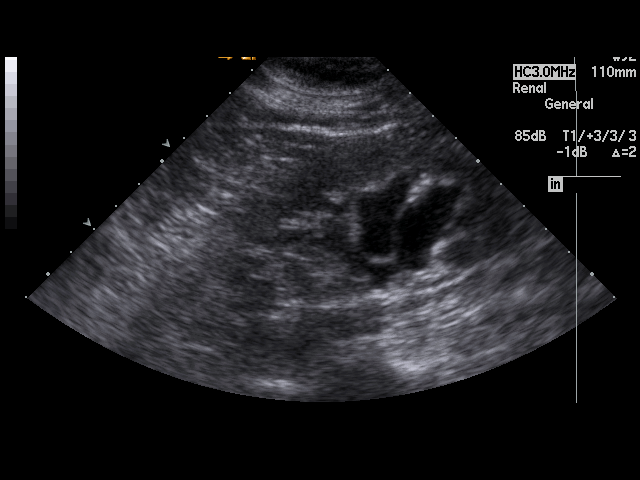
[im 17/27]
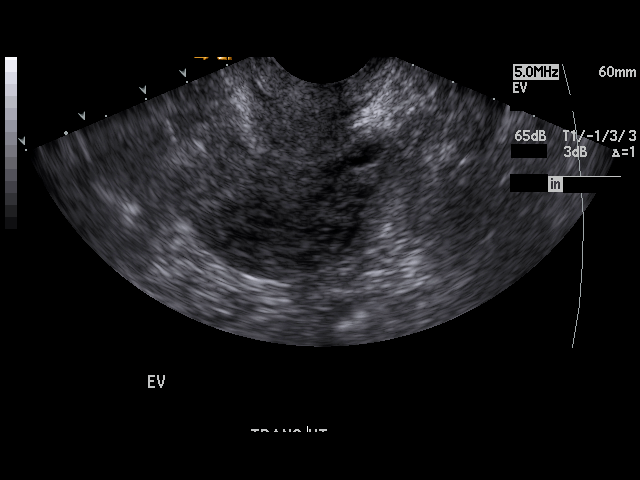
[im 18/27]
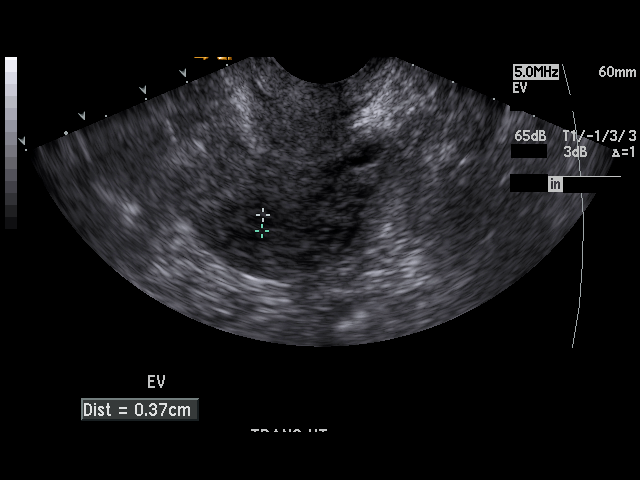
[im 20/27]
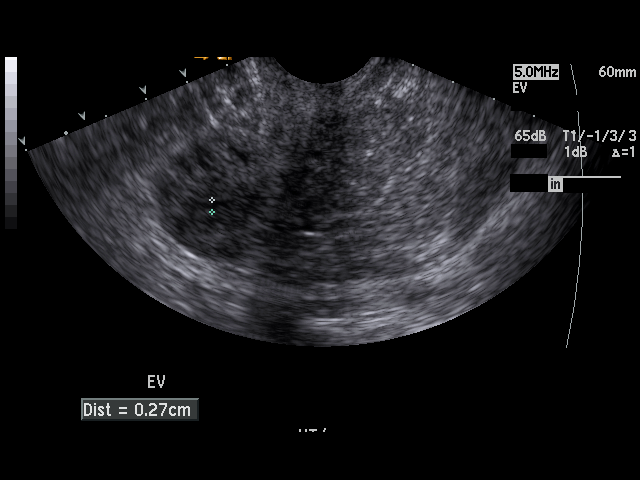
[im 21/27]
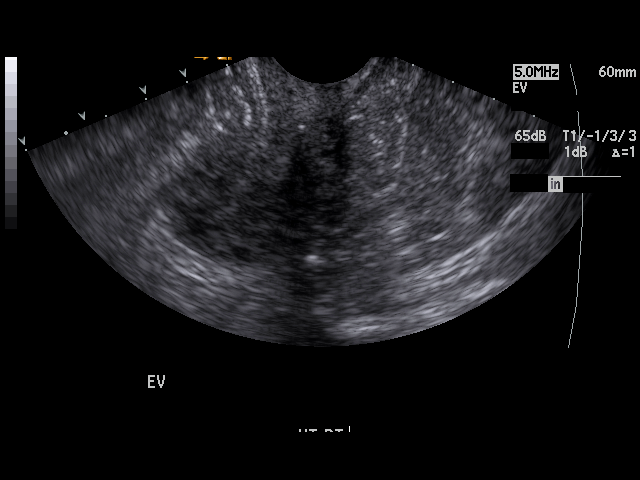
[im 23/27]
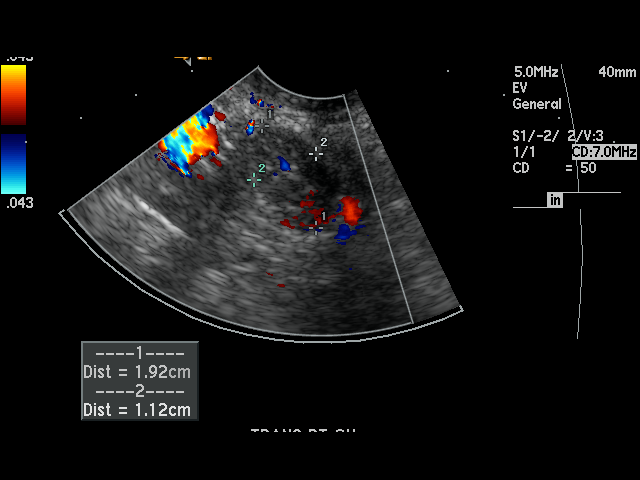
[im 24/27]
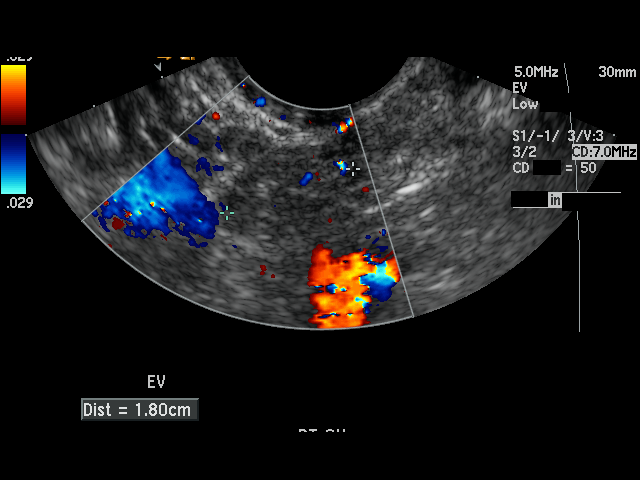
[im 27/27]
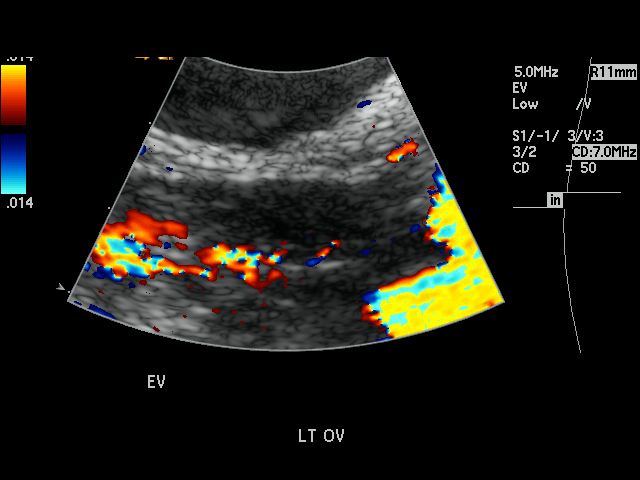

[17 of 25 positions shown; findings below may reference images not displayed]

PROCEDURE:     US  - US PELVIS MASS EXAM W/TRANSVAGI  - [DATE]  [DATE]

RESULT:     Ultrasound of the pelvis is performed utilizing transabdominal
and endovaginal scanning. The uterus measures 6.7 x 2.8 x 4.4 cm. The
endometrial stripe thickness is 3.7 mm. The LEFT ovary measures 1.5 x 0.9 x
1.1 cm. The RIGHT ovary measures 1.9 x 1.1 x 1.8 cm. There appears to be
minimal hydronephrosis of the LEFT kidney. The etiology is uncertain. The
RIGHT kidney appears to be grossly normal. There is urine in the bladder.
There appears to be some fluid in the endometrial canal. Gynecologic
followup is recommended.
IMPRESSION: 1. Fluid in the endometrial canal.
2. Findings suggestive of minimal to mild LEFT hydronephrosis. Further
investigation is recommended. No ovarian lesions are evident on today's exam.

## 2008-06-29 IMAGING — US US PELV - US TRANSVAGINAL
1 series · 17 of 25 positions shown · non-contrast
Comparison: none

REASON FOR EXAM: HX ovarian CA     F/U cyst
COMMENTS:

[Series 1: us pelv - us transvaginal · 17 of 27 slices shown]
[im 1/27]
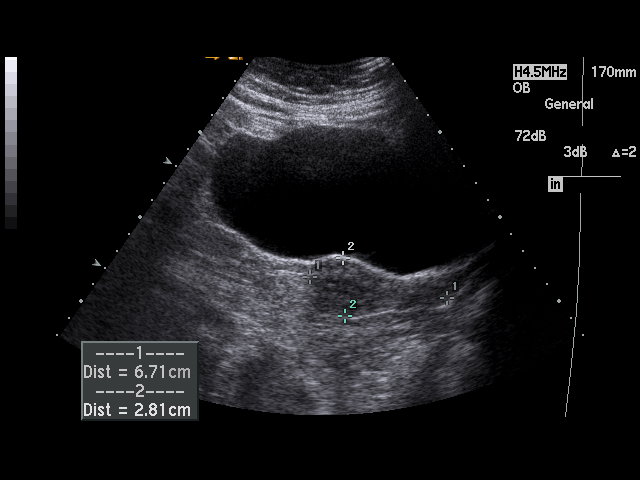
[im 3/27]
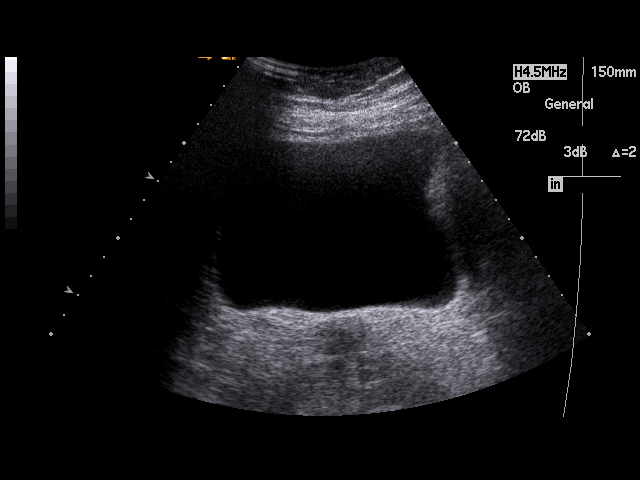
[im 4/27]
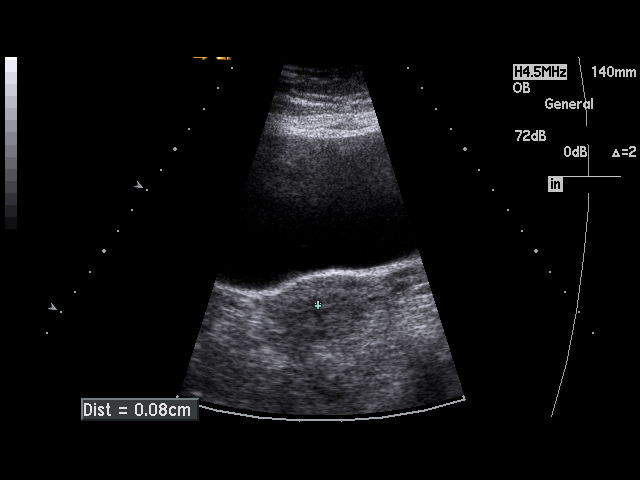
[im 6/27]
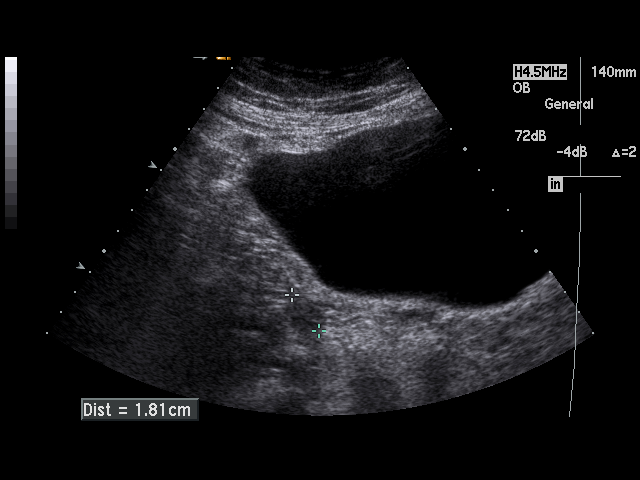
[im 7/27]
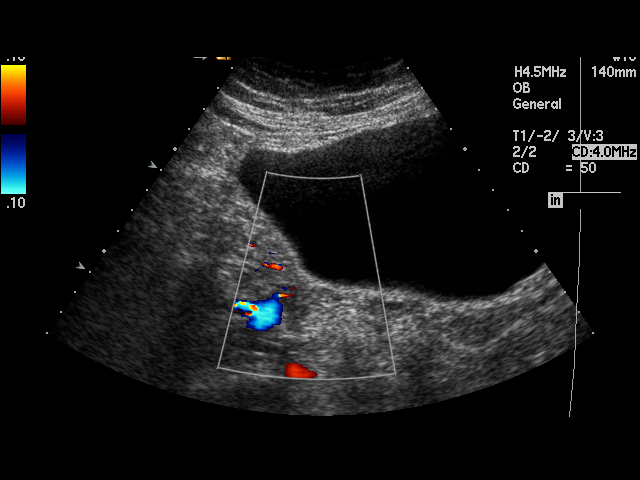
[im 9/27]
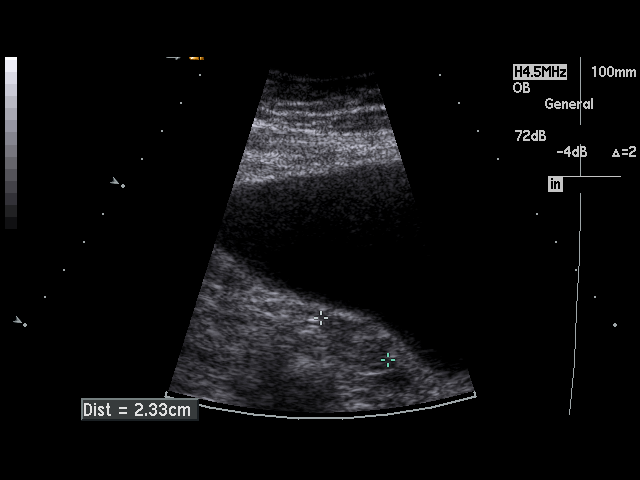
[im 10/27]
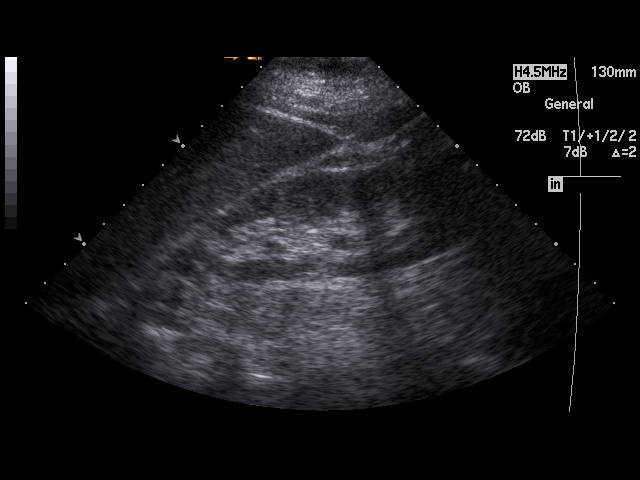
[im 12/27]
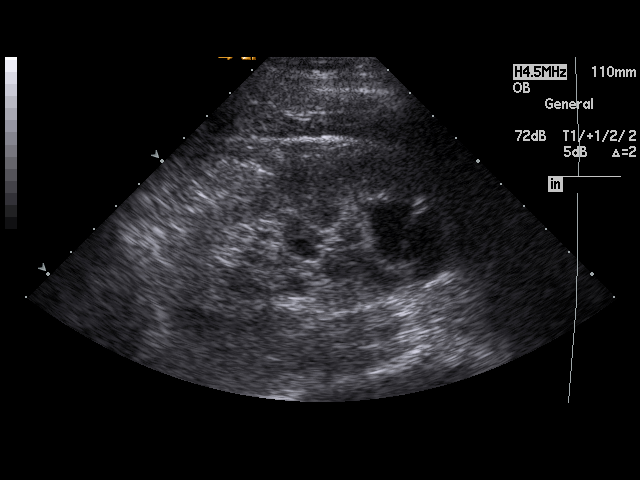
[im 14/27]
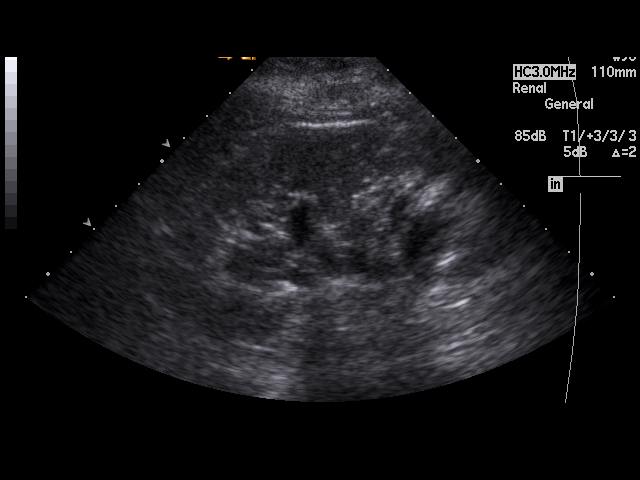
[im 15/27]
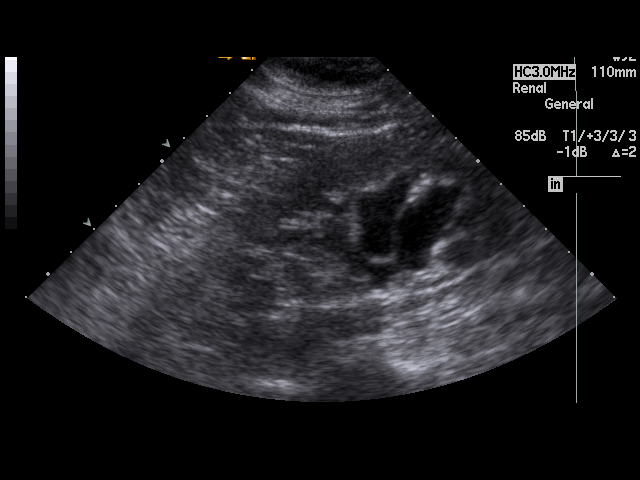
[im 17/27]
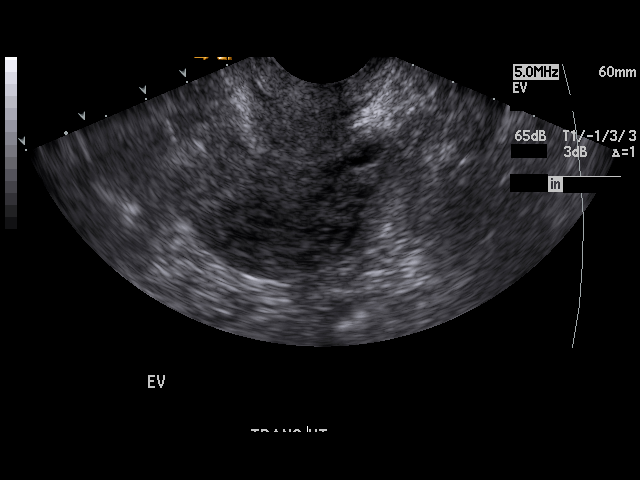
[im 18/27]
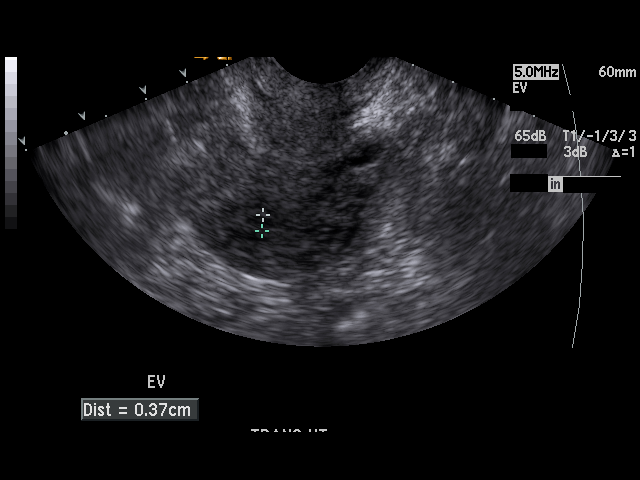
[im 20/27]
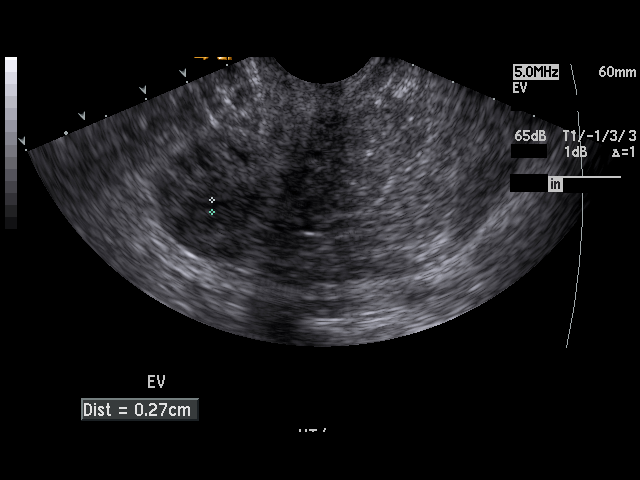
[im 21/27]
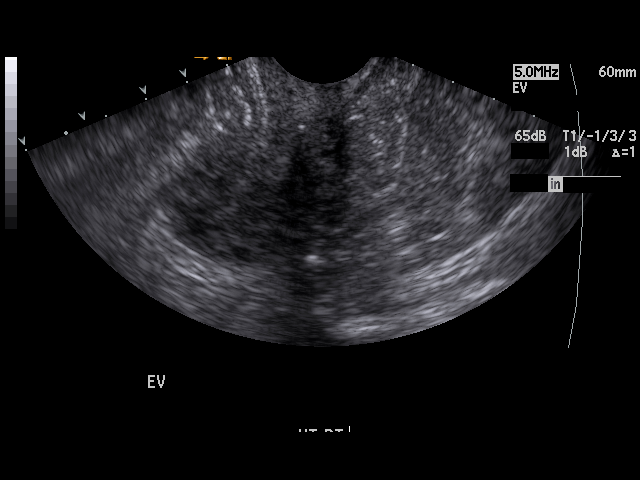
[im 23/27]
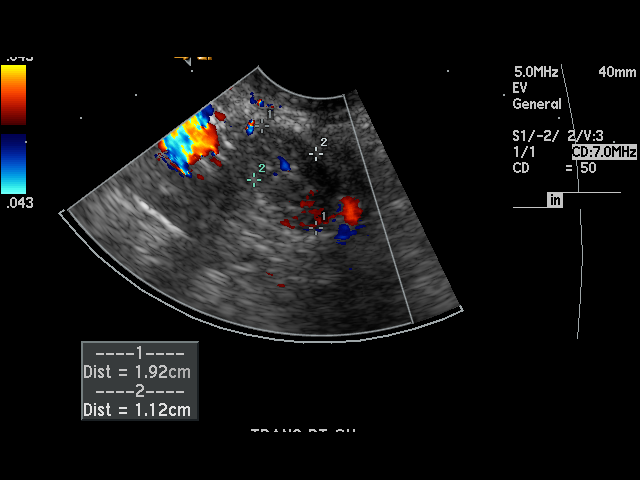
[im 24/27]
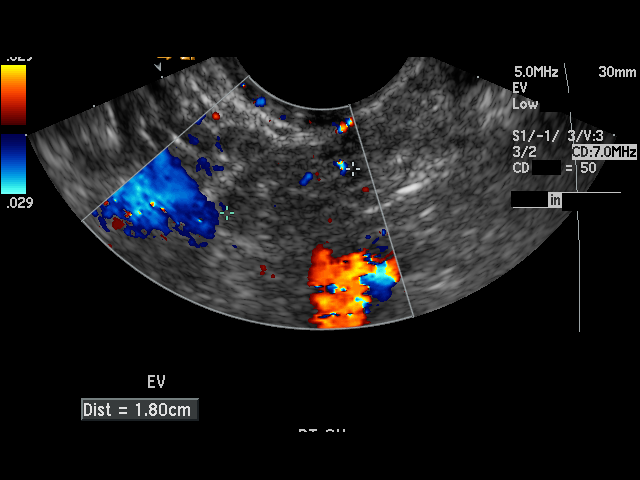
[im 27/27]
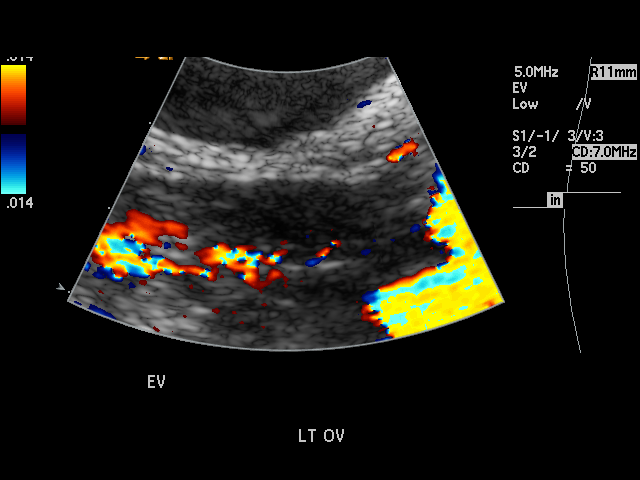

[17 of 25 positions shown; findings below may reference images not displayed]

PROCEDURE:     US  - US PELVIS MASS EXAM W/TRANSVAGI  - [DATE]  [DATE]

RESULT:     Ultrasound of the pelvis is performed utilizing transabdominal
and endovaginal scanning. The uterus measures 6.7 x 2.8 x 4.4 cm. The
endometrial stripe thickness is 3.7 mm. The LEFT ovary measures 1.5 x 0.9 x
1.1 cm. The RIGHT ovary measures 1.9 x 1.1 x 1.8 cm. There appears to be
minimal hydronephrosis of the LEFT kidney. The etiology is uncertain. The
RIGHT kidney appears to be grossly normal. There is urine in the bladder.
There appears to be some fluid in the endometrial canal. Gynecologic
followup is recommended.
IMPRESSION: 1. Fluid in the endometrial canal.
2. Findings suggestive of minimal to mild LEFT hydronephrosis. Further
investigation is recommended. No ovarian lesions are evident on today's exam.

## 2008-07-05 ENCOUNTER — Ambulatory Visit: Payer: Self-pay | Admitting: Obstetrics and Gynecology

## 2008-07-23 ENCOUNTER — Ambulatory Visit: Payer: Self-pay | Admitting: Urology

## 2008-07-23 IMAGING — NM NM RENOGRAM W/ LASIX
4 series · 24 of 24 positions shown · non-contrast
Comparison: none

REASON FOR EXAM: Left Hyrdonephrosis
COMMENTS:

[Series 1000: renal (results) · 7.79mm/px · 6 of 99 frames shown]
[frame 9/99]
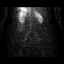
[frame 25/99]
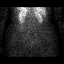
[frame 42/99]
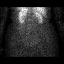
[frame 58/99]
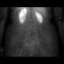
[frame 75/99]
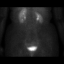
[frame 91/99]
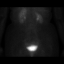

[Series 1000: lasix · 4.80mm/px · 6 of 40 frames shown]
[frame 4/40]
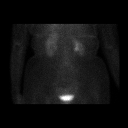
[frame 10/40]
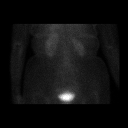
[frame 17/40]
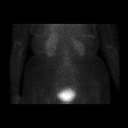
[frame 24/40]
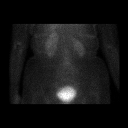
[frame 30/40]
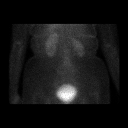
[frame 37/40]
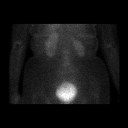

[Series 1000: renal · 7.79mm/px · 6 of 99 frames shown]
[frame 9/99]
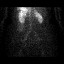
[frame 25/99]
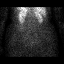
[frame 42/99]
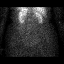
[frame 58/99]
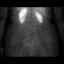
[frame 75/99]
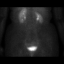
[frame 91/99]
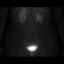

[Series 1000: lasix (results) · 4.80mm/px · 6 of 40 frames shown]
[frame 4/40]
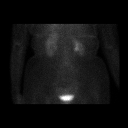
[frame 10/40]
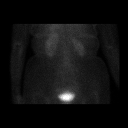
[frame 17/40]
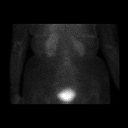
[frame 24/40]
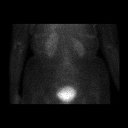
[frame 30/40]
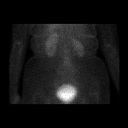
[frame 37/40]
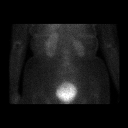

[24 of 24 positions shown; findings below may reference images not displayed]

PROCEDURE:     NM  - NM  RENAL LASIX  2 0F [DATE] [DATE]

RESULT:     Following intravenous administration a 14.7 mCi technetium 99m
DTPA, bilateral renal scan was performed. There is prompt visualization of
tracer activity in both kidneys. There is approximately 60.8% function on
the LEFT and 39.2% function on the RIGHT. Pre-Lasix scan shows somewhat slow
washout bilaterally but no abnormal accumulation of tracer on either side
suspicious for hydronephrosis is seen.

The post Lasix scan shows symmetrical tracer activity bilaterally with no
delay in washout seen on either side.
IMPRESSION: No findings suspicious for renal outflow obstruction are identified on
either side.

## 2008-08-19 ENCOUNTER — Ambulatory Visit: Payer: Self-pay | Admitting: Gynecologic Oncology

## 2008-09-03 ENCOUNTER — Ambulatory Visit: Payer: Self-pay | Admitting: Gynecologic Oncology

## 2008-09-18 ENCOUNTER — Ambulatory Visit: Payer: Self-pay | Admitting: Gynecologic Oncology

## 2008-09-29 ENCOUNTER — Ambulatory Visit: Payer: Self-pay | Admitting: Obstetrics and Gynecology

## 2008-10-08 ENCOUNTER — Ambulatory Visit: Payer: Self-pay | Admitting: Obstetrics and Gynecology

## 2008-10-19 ENCOUNTER — Ambulatory Visit: Payer: Self-pay | Admitting: Gynecologic Oncology

## 2009-02-26 ENCOUNTER — Emergency Department: Payer: Self-pay | Admitting: Emergency Medicine

## 2009-02-26 IMAGING — CT CT ABD-PELV W/ CM
1 of 2 series · 15 of 32 positions shown, 19 images · IV contrast (isovue)
Comparison: none

REASON FOR EXAM: (1) LLQ pain concern for diverticulitis; (2) LLQ pain
concern for diverticulitis
COMMENTS:

PROCEDURE:     CT  - CT ABDOMEN / PELVIS  W  - [DATE]  [DATE]
RESULT:     CT abdomen and pelvis dated [DATE]
TECHNIQUE: Helical 5 mm sections were obtained from the lung bases through
the pubic symphysis status post intravenous administration of 100 mL Isovue
370 and oral contrast.

[Series 2: abdomen · axial · 0.67mm/px · z∈[-252,+122]mm · 15 of 83 slices shown, 19 images]
[im 4/83  soft-tissue]
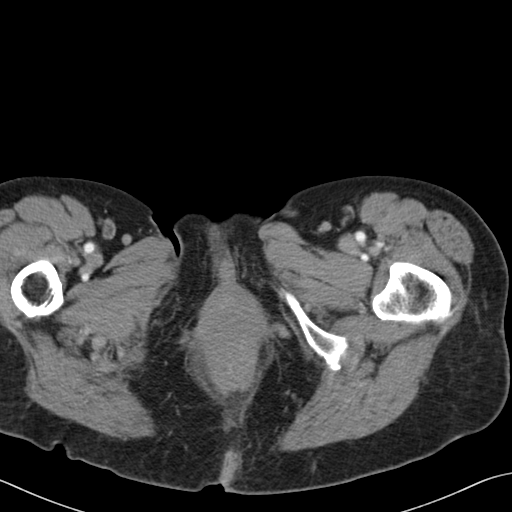
[im 4/83  bone]
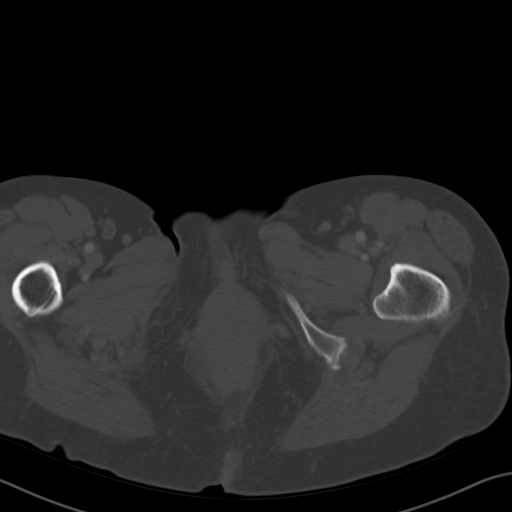
[im 11/83  soft-tissue]
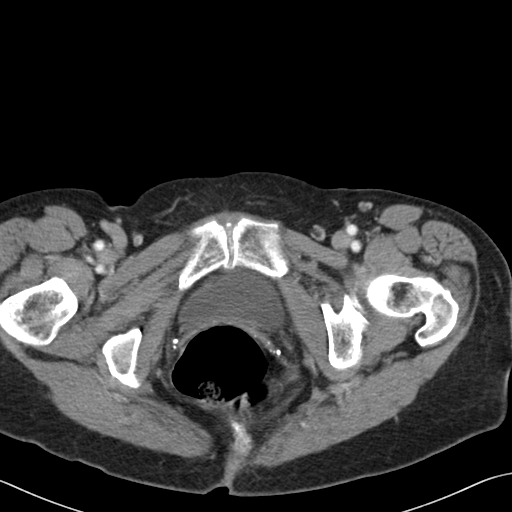
[im 18/83  soft-tissue]
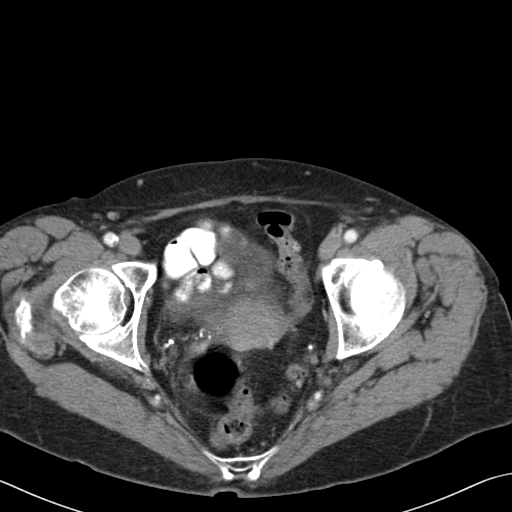
[im 24/83  soft-tissue]
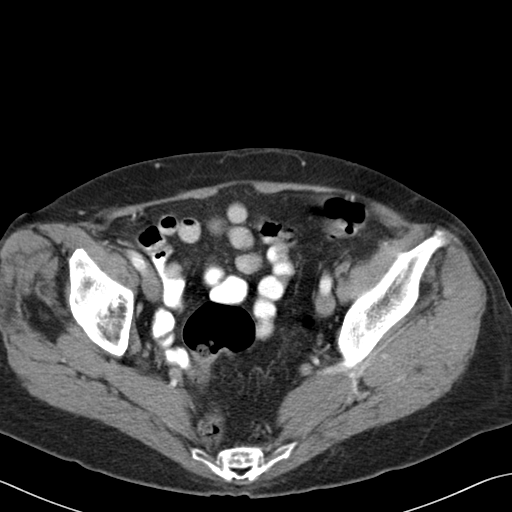
[im 28/83  soft-tissue]
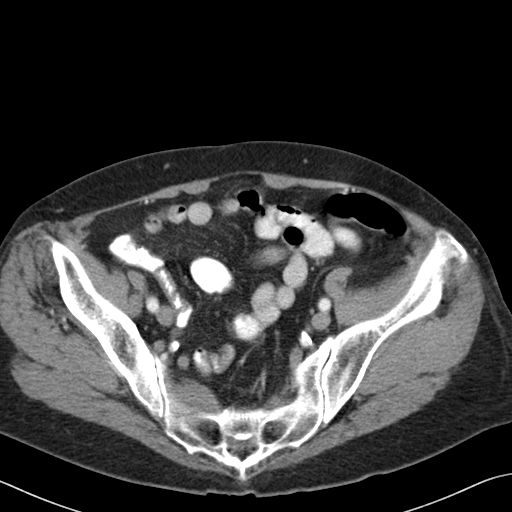
[im 35/83  soft-tissue]
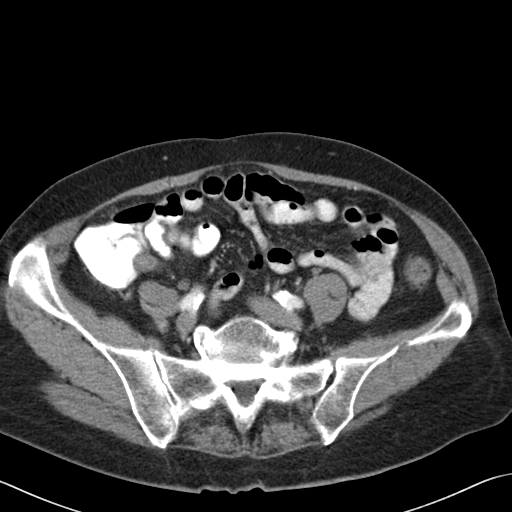
[im 42/83  soft-tissue]
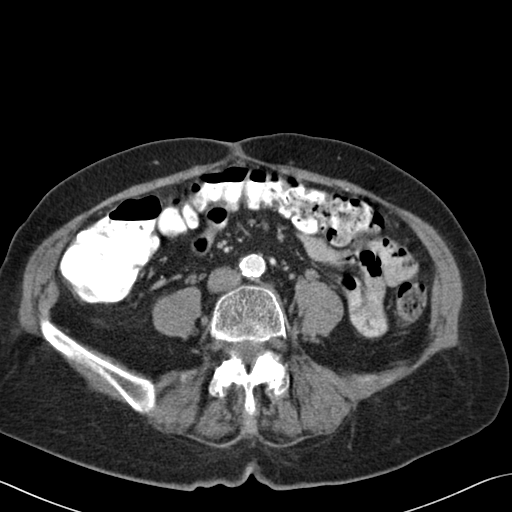
[im 48/83  soft-tissue]
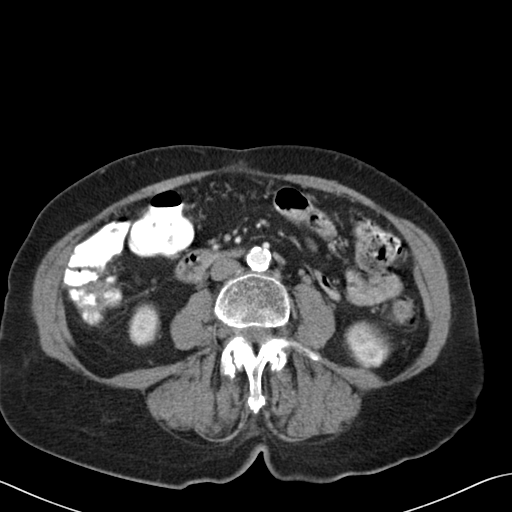
[im 55/83  soft-tissue]
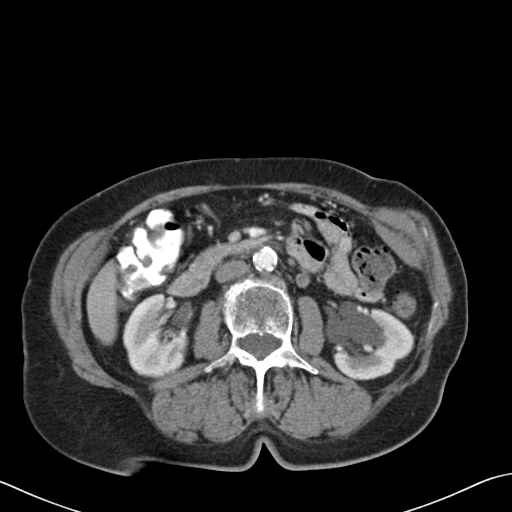
[im 55/83  bone]
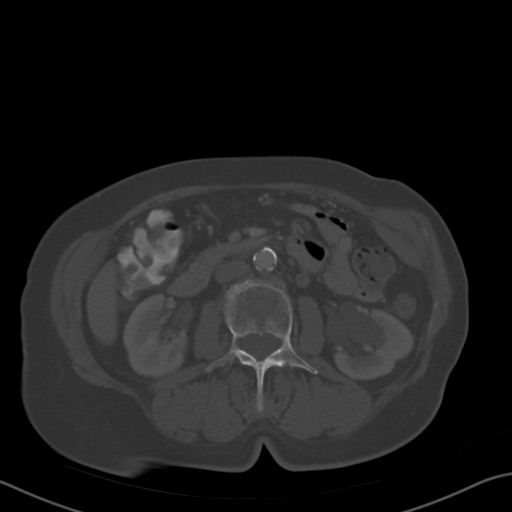
[im 59/83  soft-tissue]
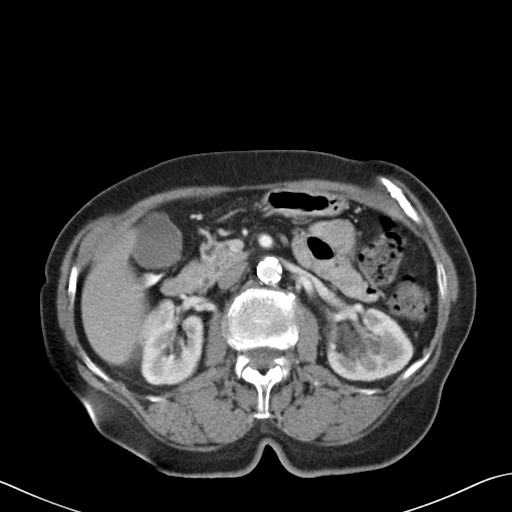
[im 65/83  soft-tissue]
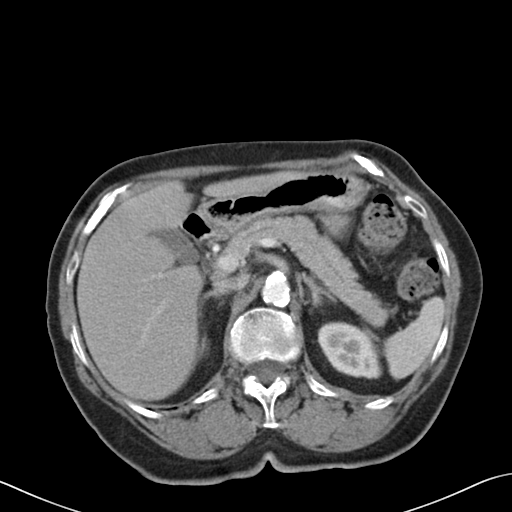
[im 69/83  lung]
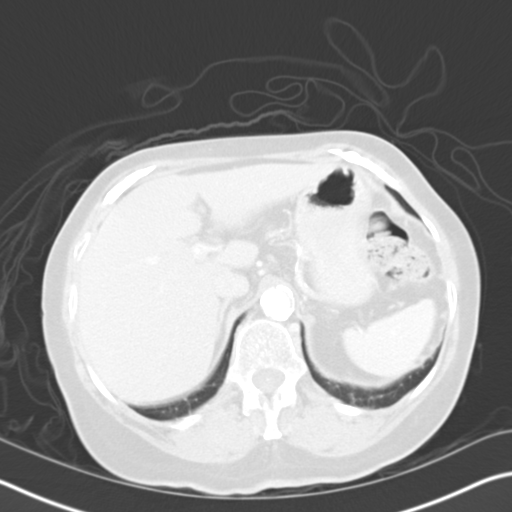
[im 72/83  soft-tissue]
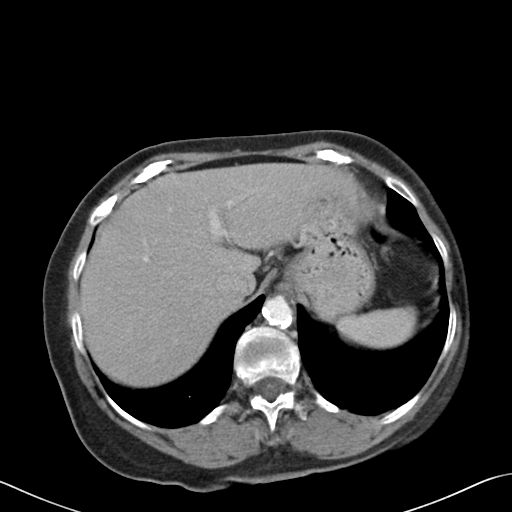
[im 72/83  lung]
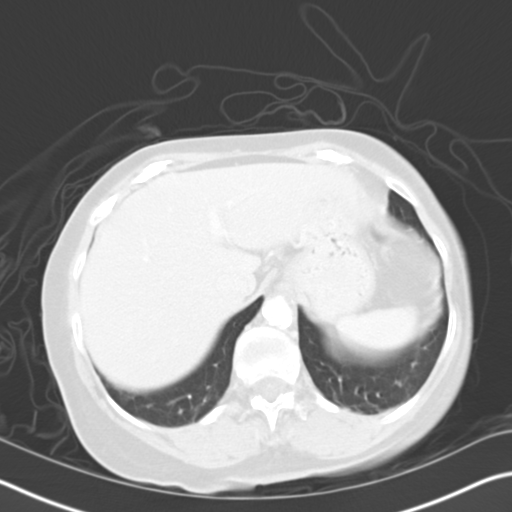
[im 76/83  lung]
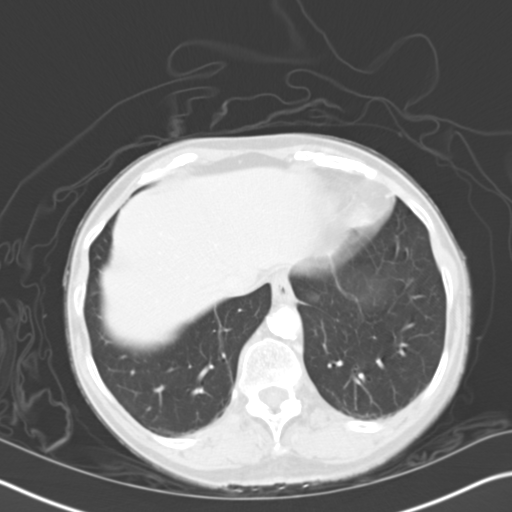
[im 79/83  soft-tissue]
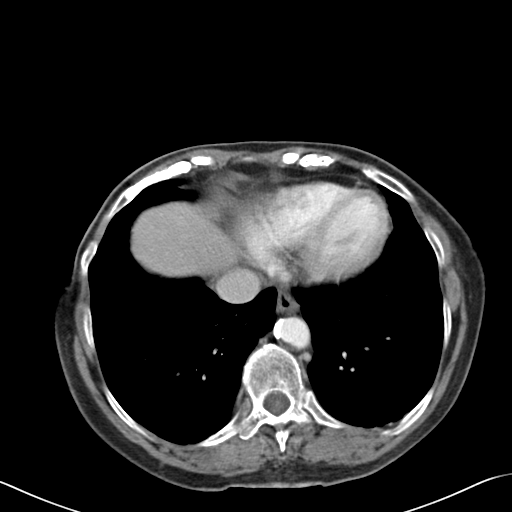
[im 79/83  lung]
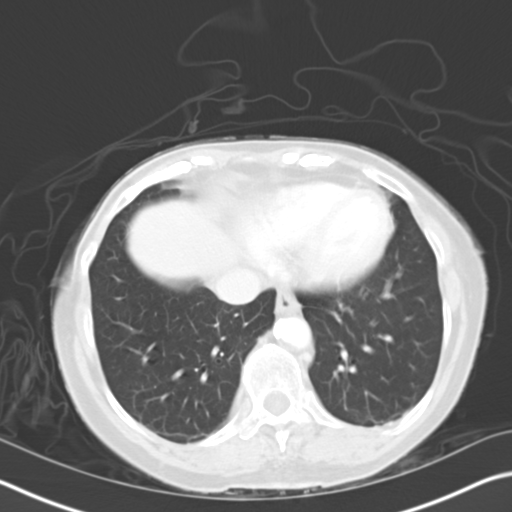

[15 of 32 positions shown; findings below may reference images not displayed]

FINDINGS: Evaluation of the lung bases demonstrates thickening of interstitial
markings. This is mild. No focal regions of consolidation of focal
infiltrates are appreciated. The liver, spleen,, adrenals, pancreas is
unremarkable. The right kidney demonstrates a small extrarenal pelvis and is
otherwise unremarkable. Evaluation of the left kidney demonstrates areas of
low-attenuation within the renal pelvis which likely represent peripelvic
cyst. Is no evidence of enhancement to suggest sequela of a noncystic mass.
Skimming further evaluated with triphasic CT in the future. No further
evidence of renal masses are identified. Note alternative the findings
within the renal pelvis there is a component of hydronephrosis. No CT
evidence of nephro or ureterolithiasis is appreciated though this study is
not protocol as a renal colic evaluation. There is no CT evidence of
abdominal aortic aneurysm. Is no evidence of bowel obstruction. No secondary
signs reflecting enteritis, colitis, diverticulitis, nor appendicitis
appreciated. Is no evidence of abdominal aortic aneurysm. No free fluid nor
drainable loculated fluid collections masses or adenopathy is appreciated.
IMPRESSION: No CT evidence of obstructive or inflammatory processes in
the abdomen or pelvis.
2. Mild to slightly moderate hydronephrosis versus peripelvic cyst left
kidney

## 2009-07-18 ENCOUNTER — Ambulatory Visit: Payer: Self-pay

## 2010-07-20 ENCOUNTER — Ambulatory Visit: Payer: Self-pay | Admitting: Internal Medicine

## 2011-07-24 ENCOUNTER — Ambulatory Visit: Payer: Self-pay | Admitting: Internal Medicine

## 2011-07-24 IMAGING — US US CAROTID DUPLEX BILAT
1 series · 17 of 24 positions shown · non-contrast
Comparison: none

REASON FOR EXAM: syncope  bilateral bruits
COMMENTS:

[Series 1: us carotid duplex bilat · 17 of 65 slices shown]
[im 1/65]
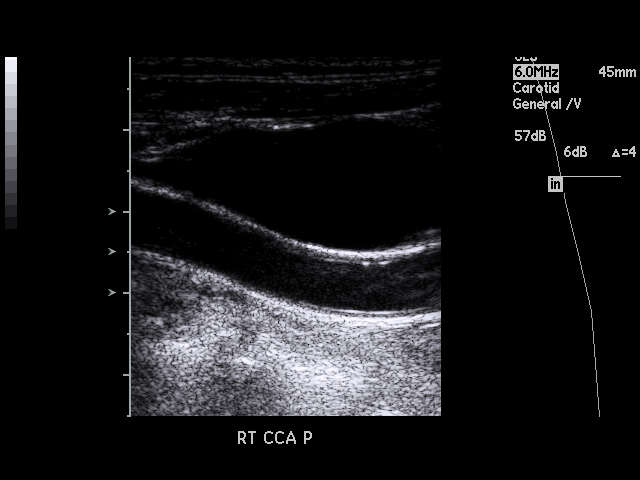
[im 6/65]
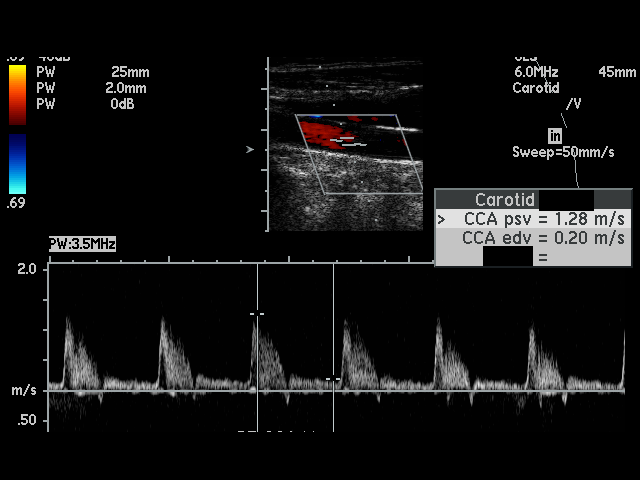
[im 9/65]
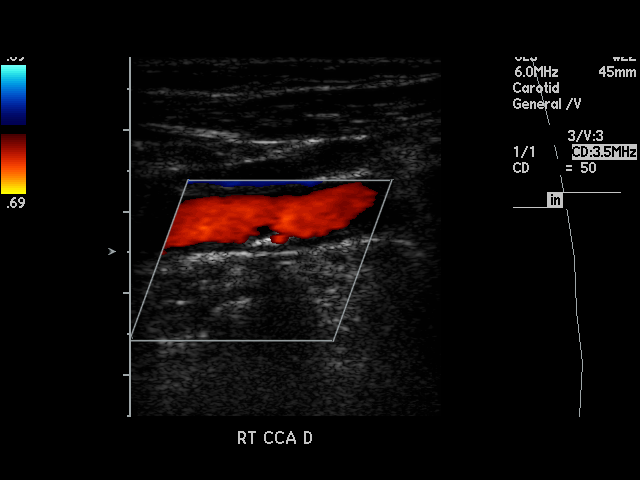
[im 12/65]
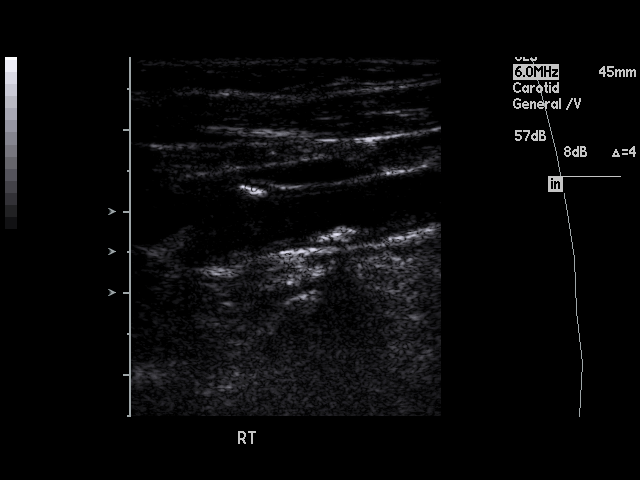
[im 17/65]
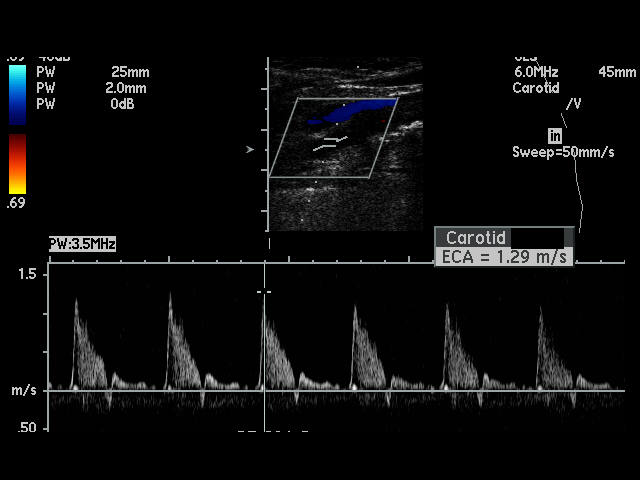
[im 20/65]
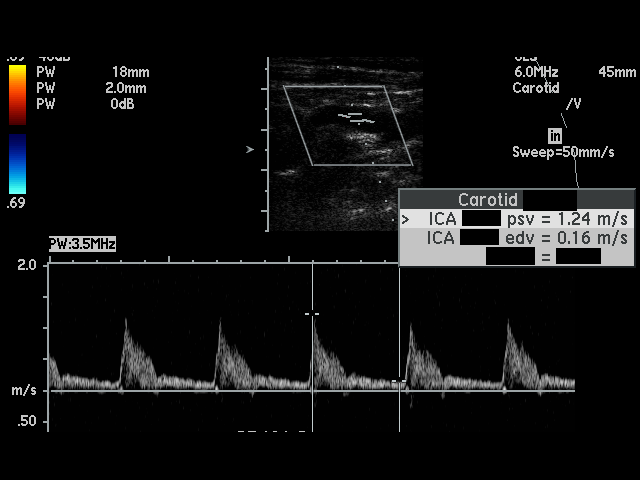
[im 26/65]
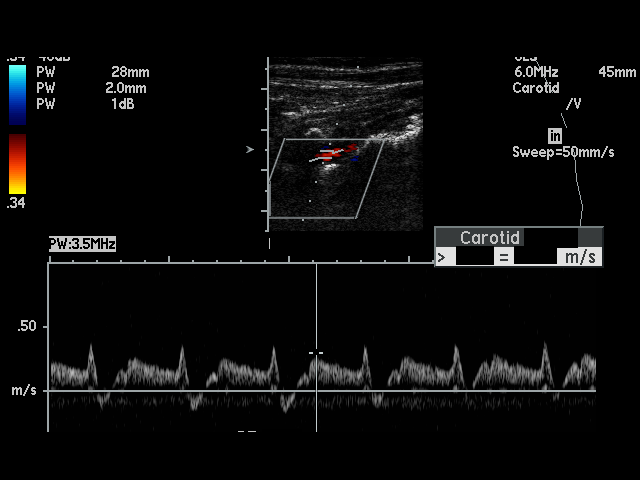
[im 28/65]
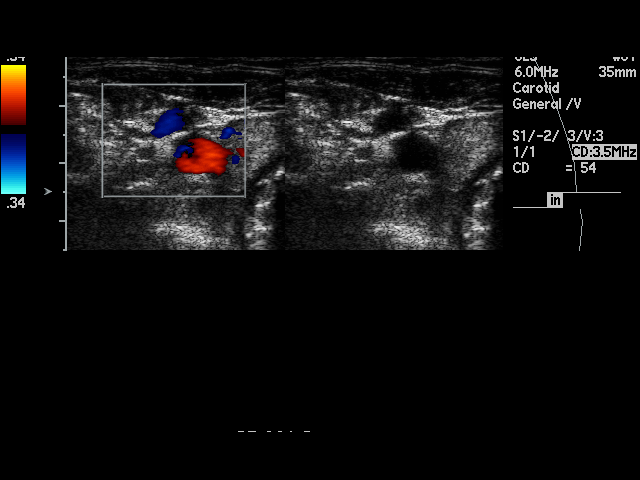
[im 34/65]
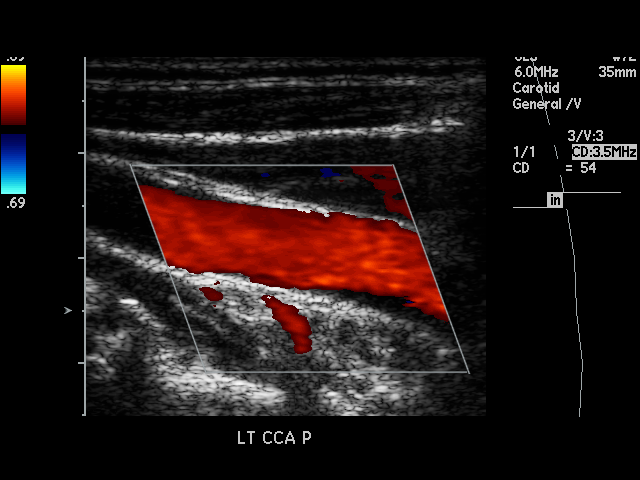
[im 37/65]
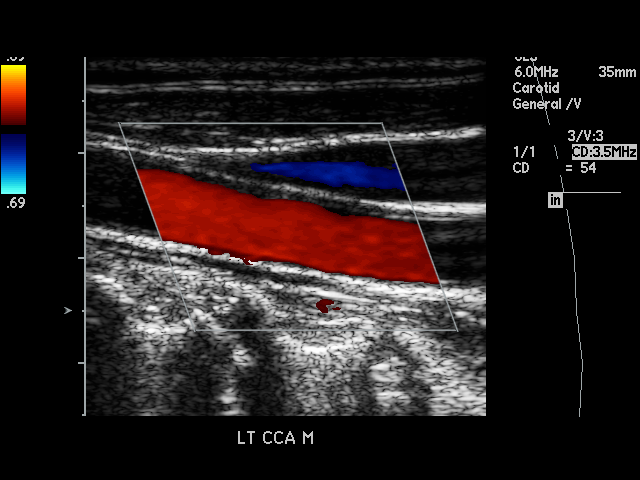
[im 39/65]
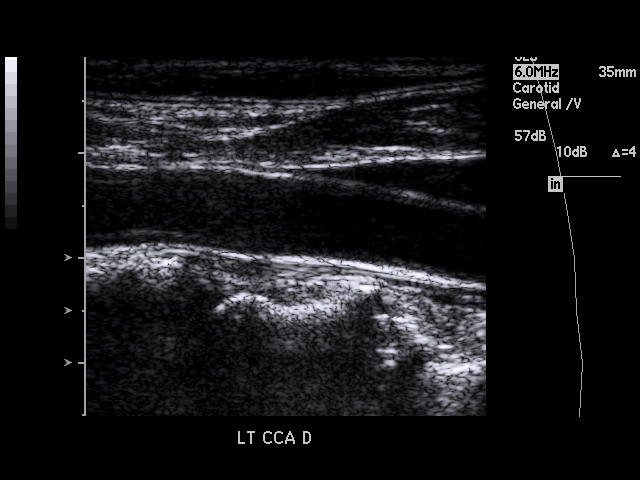
[im 45/65]
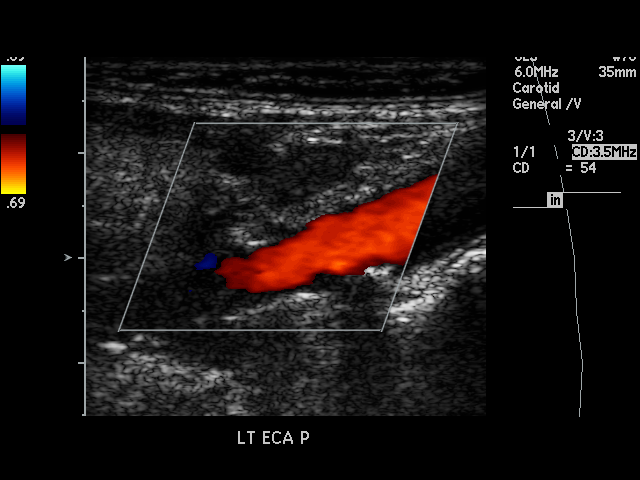
[im 48/65]
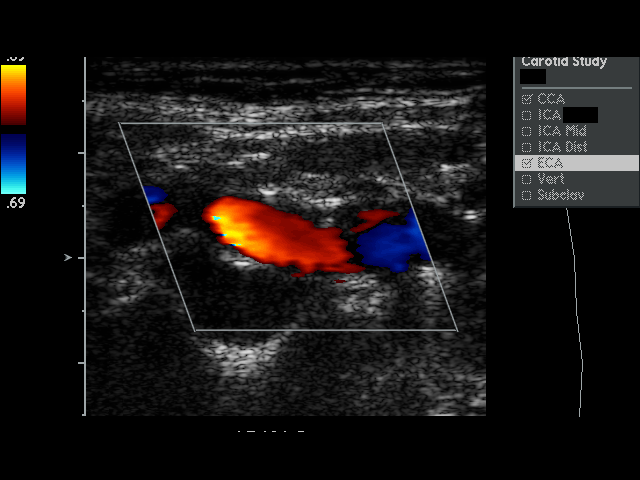
[im 53/65]
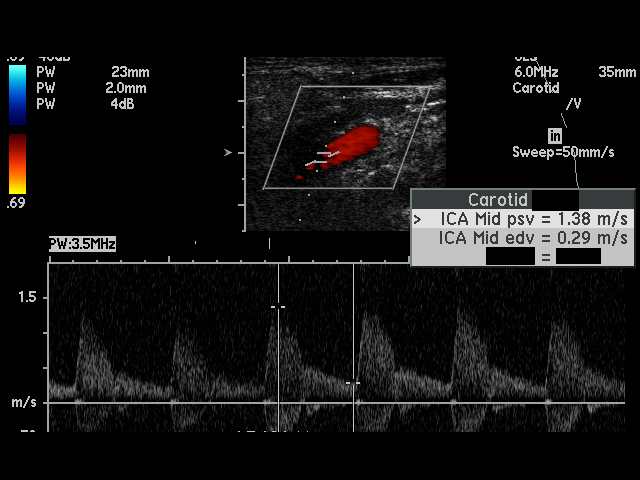
[im 56/65]
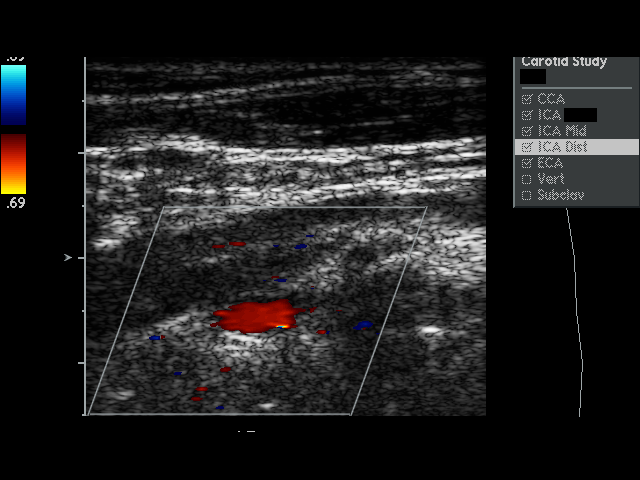
[im 59/65]
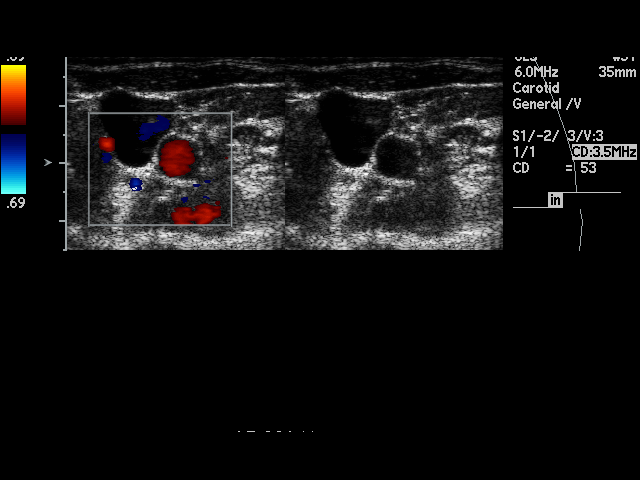
[im 65/65]
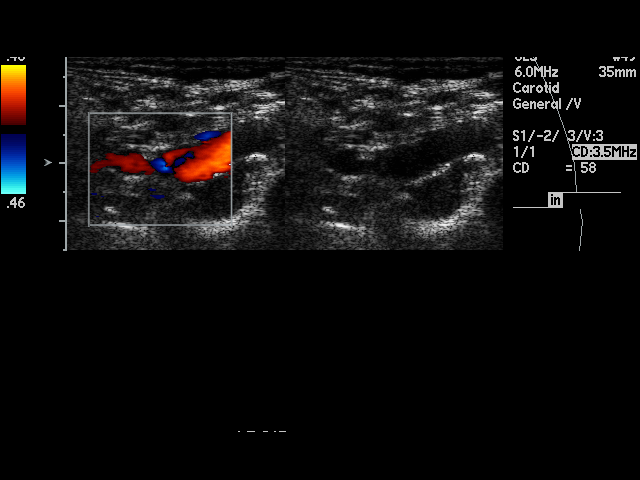

[17 of 24 positions shown; findings below may reference images not displayed]

PROCEDURE:     [place] - [place] CAROTID DOPPLER BILATERAL  - [DATE] [DATE]

RESULT:

Carotid Doppler interrogation demonstrates the presence of atherosclerotic
calcified plaque bilaterally without visual evidence of significant
narrowing. The color and spectral Doppler appearance is normal. Antegrade
flow is seen in the carotids and vertebral arteries bilaterally. The peak
systolic velocities are normal. The internal to common carotid peak systolic
velocity ratio on the right is 0.97. The ratio on the left is 1.00. The
maximal peak systolic velocity on the left in the common carotid artery is
1.53 and in the internal carotid is 1.53 in the distal portion. Tortuosity
is noted bilaterally.
IMPRESSION: Atherosclerotic disease without evidence of hemodynamically
significant stenosis.

## 2012-07-24 ENCOUNTER — Ambulatory Visit: Payer: Self-pay | Admitting: Internal Medicine

## 2012-12-01 ENCOUNTER — Encounter: Payer: Self-pay | Admitting: Nurse Practitioner

## 2012-12-02 ENCOUNTER — Ambulatory Visit: Payer: Self-pay | Admitting: Orthopedic Surgery

## 2012-12-19 ENCOUNTER — Encounter: Payer: Self-pay | Admitting: Nurse Practitioner

## 2013-01-15 ENCOUNTER — Other Ambulatory Visit: Payer: Self-pay | Admitting: Neurosurgery

## 2013-01-19 ENCOUNTER — Encounter: Payer: Self-pay | Admitting: Nurse Practitioner

## 2013-02-02 ENCOUNTER — Ambulatory Visit (HOSPITAL_COMMUNITY)
Admission: RE | Admit: 2013-02-02 | Discharge: 2013-02-02 | Disposition: A | Discharge status: Home / Self Care | Payer: Medicare Other | Source: Ambulatory Visit | Attending: Anesthesiology | Admitting: Anesthesiology

## 2013-02-02 ENCOUNTER — Encounter (HOSPITAL_COMMUNITY)
Admission: RE | Admit: 2013-02-02 | Discharge: 2013-02-02 | Disposition: A | Discharge status: Home / Self Care | Payer: Medicare Other | Source: Ambulatory Visit | Attending: Neurosurgery | Admitting: Neurosurgery

## 2013-02-02 ENCOUNTER — Encounter (HOSPITAL_COMMUNITY): Payer: Self-pay

## 2013-02-02 DIAGNOSIS — Z01818 Encounter for other preprocedural examination: Secondary | ICD-10-CM | POA: Insufficient documentation

## 2013-02-02 DIAGNOSIS — Z01812 Encounter for preprocedural laboratory examination: Secondary | ICD-10-CM | POA: Insufficient documentation

## 2013-02-02 DIAGNOSIS — I10 Essential (primary) hypertension: Secondary | ICD-10-CM | POA: Insufficient documentation

## 2013-02-02 DIAGNOSIS — K219 Gastro-esophageal reflux disease without esophagitis: Secondary | ICD-10-CM | POA: Insufficient documentation

## 2013-02-02 DIAGNOSIS — I6529 Occlusion and stenosis of unspecified carotid artery: Secondary | ICD-10-CM | POA: Insufficient documentation

## 2013-02-02 DIAGNOSIS — H3530 Unspecified macular degeneration: Secondary | ICD-10-CM | POA: Insufficient documentation

## 2013-02-02 DIAGNOSIS — E785 Hyperlipidemia, unspecified: Secondary | ICD-10-CM | POA: Insufficient documentation

## 2013-02-02 DIAGNOSIS — E039 Hypothyroidism, unspecified: Secondary | ICD-10-CM | POA: Insufficient documentation

## 2013-02-02 DIAGNOSIS — Z01810 Encounter for preprocedural cardiovascular examination: Secondary | ICD-10-CM | POA: Insufficient documentation

## 2013-02-02 DIAGNOSIS — G43909 Migraine, unspecified, not intractable, without status migrainosus: Secondary | ICD-10-CM | POA: Insufficient documentation

## 2013-02-02 DIAGNOSIS — I700 Atherosclerosis of aorta: Secondary | ICD-10-CM | POA: Insufficient documentation

## 2013-02-02 HISTORY — DX: Dorsalgia, unspecified: M54.9

## 2013-02-02 HISTORY — DX: Other specified disorders of bone density and structure, unspecified site: M85.80

## 2013-02-02 HISTORY — DX: Phlebitis and thrombophlebitis of unspecified site: I80.9

## 2013-02-02 HISTORY — DX: Essential (primary) hypertension: I10

## 2013-02-02 HISTORY — DX: Unspecified macular degeneration: H35.30

## 2013-02-02 HISTORY — DX: Personal history of other diseases of the nervous system and sense organs: Z86.69

## 2013-02-02 HISTORY — DX: Urgency of urination: R39.15

## 2013-02-02 HISTORY — DX: Constipation, unspecified: K59.00

## 2013-02-02 HISTORY — DX: Hypothyroidism, unspecified: E03.9

## 2013-02-02 HISTORY — DX: Other specified symptoms and signs involving the circulatory and respiratory systems: R09.89

## 2013-02-02 HISTORY — DX: Effusion, unspecified joint: M25.40

## 2013-02-02 HISTORY — DX: Nocturia: R35.1

## 2013-02-02 HISTORY — DX: Other specified postprocedural states: Z98.890

## 2013-02-02 HISTORY — DX: Diverticulosis of intestine, part unspecified, without perforation or abscess without bleeding: K57.90

## 2013-02-02 HISTORY — DX: Anesthesia of skin: R20.0

## 2013-02-02 HISTORY — DX: Nausea with vomiting, unspecified: R11.2

## 2013-02-02 HISTORY — DX: Pain in unspecified joint: M25.50

## 2013-02-02 HISTORY — DX: Gastro-esophageal reflux disease without esophagitis: K21.9

## 2013-02-02 HISTORY — DX: Frequency of micturition: R35.0

## 2013-02-02 HISTORY — DX: Unspecified osteoarthritis, unspecified site: M19.90

## 2013-02-02 HISTORY — DX: Hyperlipidemia, unspecified: E78.5

## 2013-02-02 HISTORY — DX: Anemia, unspecified: D64.9

## 2013-02-02 HISTORY — DX: Other hemorrhoids: K64.8

## 2013-02-02 LAB — NO BLOOD PRODUCTS

## 2013-02-02 LAB — BASIC METABOLIC PANEL
Calcium: 9.6 mg/dL (ref 8.4–10.5)
Creatinine, Ser: 0.9 mg/dL (ref 0.50–1.10)
GFR calc non Af Amer: 58 mL/min — ABNORMAL LOW (ref >90)
Glucose, Bld: 89 mg/dL (ref 70–99)
Sodium: 135 mEq/L (ref 135–145)

## 2013-02-02 LAB — CBC
MCH: 30.5 pg (ref 26.0–34.0)
MCV: 90.5 fL (ref 78.0–100.0)
Platelets: 225 10*3/uL (ref 150–400)
RBC: 3.9 MIL/uL (ref 3.87–5.11)
RDW: 12.9 % (ref 11.5–15.5)

## 2013-02-02 LAB — SURGICAL PCR SCREEN: Staphylococcus aureus: NEGATIVE

## 2013-02-02 IMAGING — CR DG CHEST 2V
2 series · 2 of 2 positions shown · non-contrast
Comparison: None

CLINICAL DATA: Preop lumbar surgery. Hypertension.

EXAM:
CHEST  2 VIEW

[w chest pa]
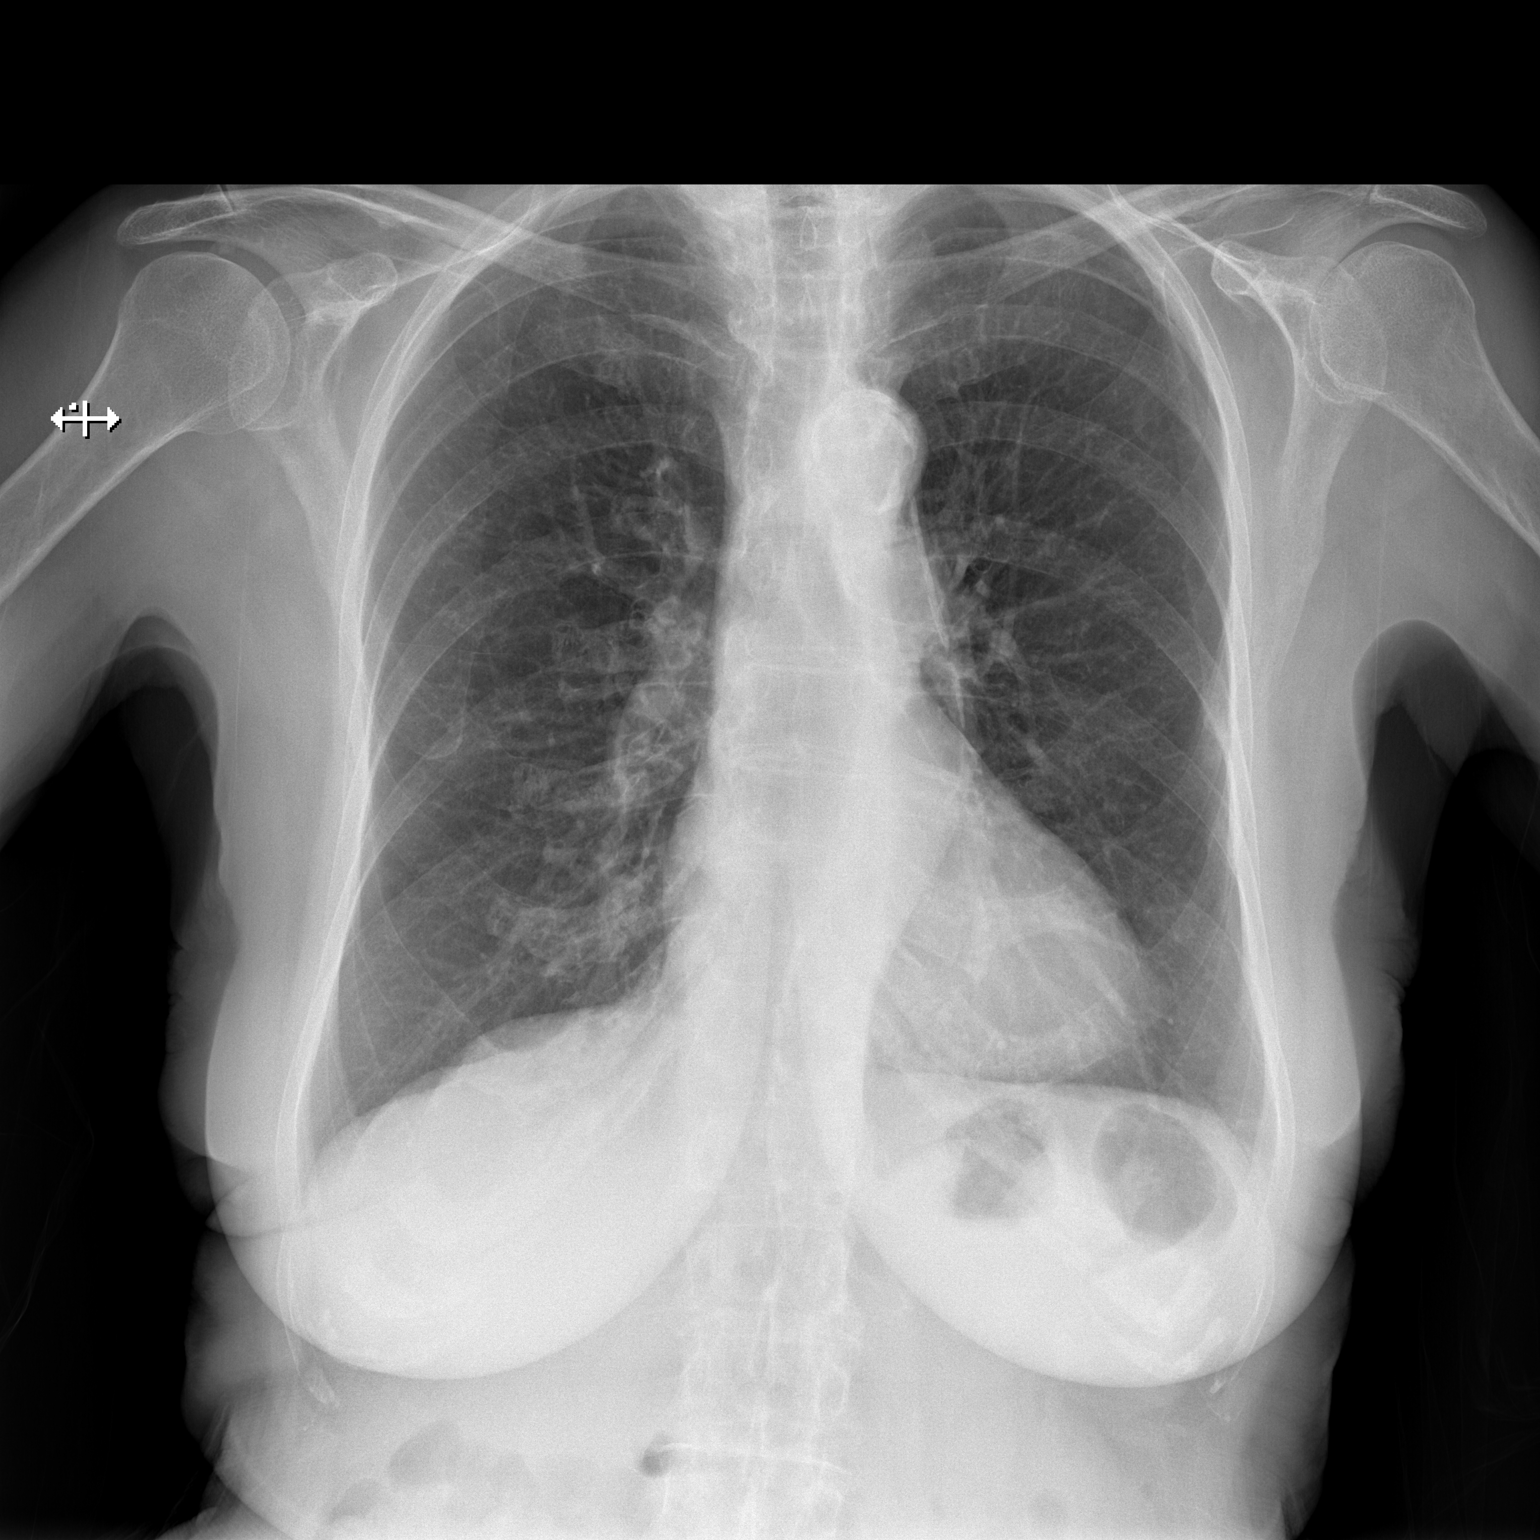

[w chest lat]
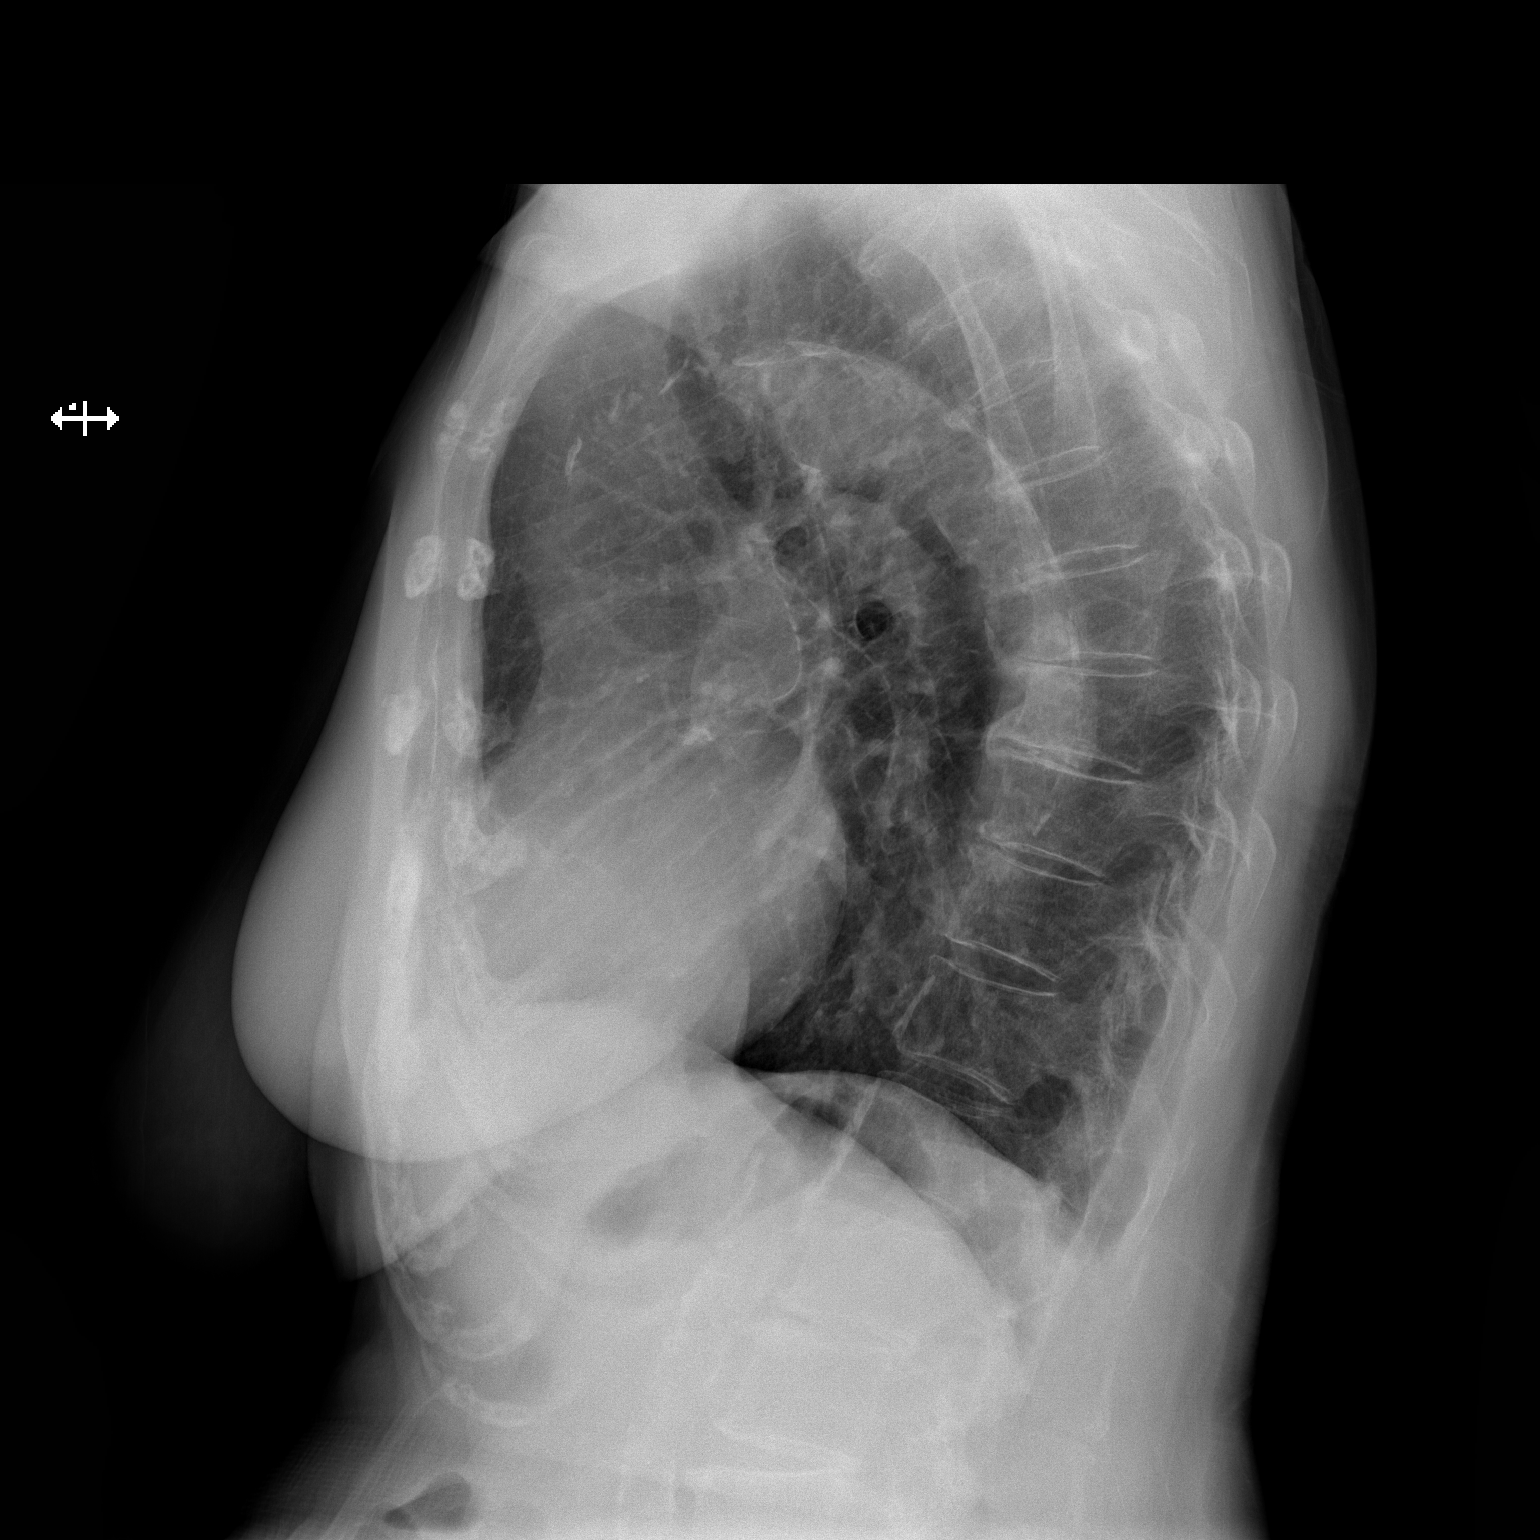

[2 of 2 positions shown; findings below may reference images not displayed]

FINDINGS: Mild hyperinflation of the lungs. Tortuosity and calcifications of
the thoracic aorta. Heart and mediastinal contours are within normal
limits. No focal opacities or effusions. No acute bony abnormality.
IMPRESSION: No active cardiopulmonary disease.

## 2013-02-02 NOTE — Progress Notes (Signed)
[facility]-echo/stress test to be requested  [facility] Dr.Paraschos-is cardiologist-to request last office visit  Denies ever having a heart cath  Medical Md is [facility] with [facility]  Denies EKG or CXR in past yr

## 2013-02-02 NOTE — Pre-Procedure Instructions (Signed)
Michele [name]  02/02/2013   Your procedure is scheduled on: Thurs, Sept 18 @ 7:30 AM  Report to [facility] at 5:30 AM.  Call this number if you have problems the morning of surgery: [phone]   Remember:   Do not eat food or drink liquids after midnight.   Take these medicines the morning of surgery with A SIP OF WATER: Synthroid(Levothyroxine),Detrol               No Goody's,BC's,Aspirin,Aleve,Ibuprofen,Fish Oil,or any Herbal Medications   Do not wear jewelry, make-up or nail polish.  Do not wear lotions, powders, or perfumes. You may wear deodorant.  Do not shave 48 hours prior to surgery.   Do not bring valuables to the hospital.  [facility] is not responsible                   for any belongings or valuables.  Contacts, dentures or bridgework may not be worn into surgery.  Leave suitcase in the car. After surgery it may be brought to your room.  For patients admitted to the hospital, checkout time is 11:00 AM the day of  discharge.       Special Instructions: Shower using CHG 2 nights before surgery and the night before surgery.  If you shower the day of surgery use CHG.  Use special wash - you have one bottle of CHG for all showers.  You should use approximately 1/3 of the bottle for each shower.   Please read over the following fact sheets that you were given: Pain Booklet, Coughing and Deep Breathing, Blood Transfusion Information, MRSA Information and Surgical Site Infection Prevention

## 2013-02-03 ENCOUNTER — Encounter (HOSPITAL_COMMUNITY): Payer: Self-pay

## 2013-02-03 NOTE — Progress Notes (Signed)
Anesthesia Chart Review:  Patient is a 79 year old female scheduled for L3-4, L4-5 laminectomies with PLIF on 02/05/13 by Dr. Lovell Sheehan.  History includes non-smoker, post-operative N/V, HTN, HLD, migraines, GERD, anemia, hypothyroidism, macular degeneration, post-operative N/V, carotid artery stenosis (reportedly not yet severe enough to warrant surgery).  She has been evaluated by cardiologist Dr. Darrold Junker at [facility] in the past for abnormal EKG.  Dr. Darrold Junker cleared patient for this procedure.  There is no stress or echo done within the past three years at [facility] or [facility]. PCP is Dr. Larwance Sachs with [facility].  EKG on 02/02/13 showed NSR.  CXR on 02/02/13 showed no active cardiopulmonary disease.  Preoperative labs noted. H/H 11.9/35.3.  She has refused all blood products.  Anticipate that she can proceed as planned.  Velna Ochs [facility]/Anesthesiology Phone [phone] 02/03/2013 1:36 PM

## 2013-02-04 MED ORDER — CEFAZOLIN SODIUM-DEXTROSE 2-3 GM-% IV SOLR
2.0000 g | INTRAVENOUS | Status: AC
  Administered 2013-02-05: 2 g via INTRAVENOUS
  Filled 2013-02-04: qty 50

## 2013-02-04 NOTE — Progress Notes (Signed)
Pt called to ask about getting medications prior to surgery due to she and her spouse being elderly and not being able to go back and forth to pharmacy to get medications.  I instructed her that she will not need mupirocin and she will be able to speak with her Dr. Prior to surgery to ask if he can call in prescriptions to pharmacy so they can pick them up on the way home when discharged.  She wants prescriptions called in to [facility] pharmacy [phone]. Note also left on chart for Dr. Lovell Sheehan.

## 2013-02-05 ENCOUNTER — Inpatient Hospital Stay (HOSPITAL_COMMUNITY)
Admission: RE | Admit: 2013-02-05 | Discharge: 2013-02-09 | DRG: 460 | Disposition: A | Discharge status: Home Health | Payer: Medicare Other | Source: Ambulatory Visit | Attending: Neurosurgery | Admitting: Neurosurgery

## 2013-02-05 ENCOUNTER — Inpatient Hospital Stay (HOSPITAL_COMMUNITY): Payer: Medicare Other

## 2013-02-05 ENCOUNTER — Encounter (HOSPITAL_COMMUNITY): Payer: Self-pay | Admitting: Vascular Surgery

## 2013-02-05 ENCOUNTER — Encounter (HOSPITAL_COMMUNITY): Payer: Self-pay | Admitting: *Deleted

## 2013-02-05 ENCOUNTER — Encounter (HOSPITAL_COMMUNITY)
Admission: RE | Disposition: A | Discharge status: Home Health | Payer: Self-pay | Source: Ambulatory Visit | Attending: Neurosurgery

## 2013-02-05 ENCOUNTER — Inpatient Hospital Stay (HOSPITAL_COMMUNITY): Payer: Medicare Other | Admitting: Vascular Surgery

## 2013-02-05 DIAGNOSIS — I10 Essential (primary) hypertension: Secondary | ICD-10-CM | POA: Diagnosis present

## 2013-02-05 DIAGNOSIS — K219 Gastro-esophageal reflux disease without esophagitis: Secondary | ICD-10-CM | POA: Diagnosis present

## 2013-02-05 DIAGNOSIS — M4316 Spondylolisthesis, lumbar region: Secondary | ICD-10-CM

## 2013-02-05 DIAGNOSIS — M129 Arthropathy, unspecified: Secondary | ICD-10-CM | POA: Diagnosis present

## 2013-02-05 DIAGNOSIS — M48062 Spinal stenosis, lumbar region with neurogenic claudication: Principal | ICD-10-CM | POA: Diagnosis present

## 2013-02-05 DIAGNOSIS — H3530 Unspecified macular degeneration: Secondary | ICD-10-CM | POA: Diagnosis present

## 2013-02-05 DIAGNOSIS — K5900 Constipation, unspecified: Secondary | ICD-10-CM | POA: Diagnosis present

## 2013-02-05 DIAGNOSIS — E039 Hypothyroidism, unspecified: Secondary | ICD-10-CM | POA: Diagnosis present

## 2013-02-05 DIAGNOSIS — E785 Hyperlipidemia, unspecified: Secondary | ICD-10-CM | POA: Diagnosis present

## 2013-02-05 LAB — CBC WITH DIFFERENTIAL/PLATELET
Hemoglobin: 8.7 g/dL — ABNORMAL LOW (ref 12.0–15.0)
Lymphs Abs: 1.1 10*3/uL (ref 0.7–4.0)
Monocytes Relative: 7 % (ref 3–12)
Neutro Abs: 9 10*3/uL — ABNORMAL HIGH (ref 1.7–7.7)
Neutrophils Relative %: 83 % — ABNORMAL HIGH (ref 43–77)
Platelets: 141 10*3/uL — ABNORMAL LOW (ref 150–400)
RBC: 2.86 MIL/uL — ABNORMAL LOW (ref 3.87–5.11)
WBC: 10.9 10*3/uL — ABNORMAL HIGH (ref 4.0–10.5)

## 2013-02-05 LAB — POCT I-STAT 4, (NA,K, GLUC, HGB,HCT)
Glucose, Bld: 102 mg/dL — ABNORMAL HIGH (ref 70–99)
Potassium: 3.9 mEq/L (ref 3.5–5.1)

## 2013-02-05 IMAGING — RF DG C-ARM 61-120 MIN
1 series · 2 of 2 positions shown · non-contrast
Comparison: Lumbar MRI dated [DATE]

CLINICAL DATA: Spondylolisthesis and spinal stenosis.

LUMBAR SPINE - 2-3 VIEW

[Series 1: run · 2 of 2 slices shown]
[im 1/2]
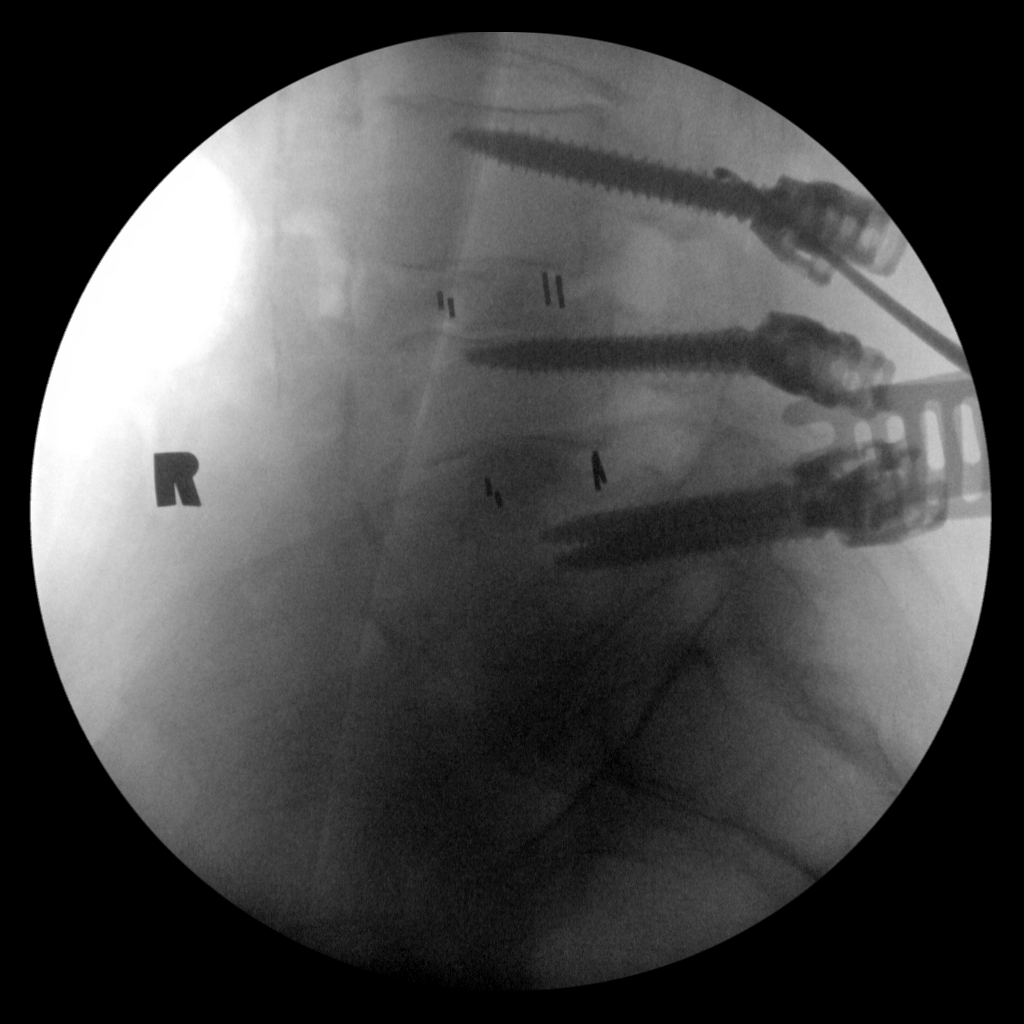
[im 2/2]
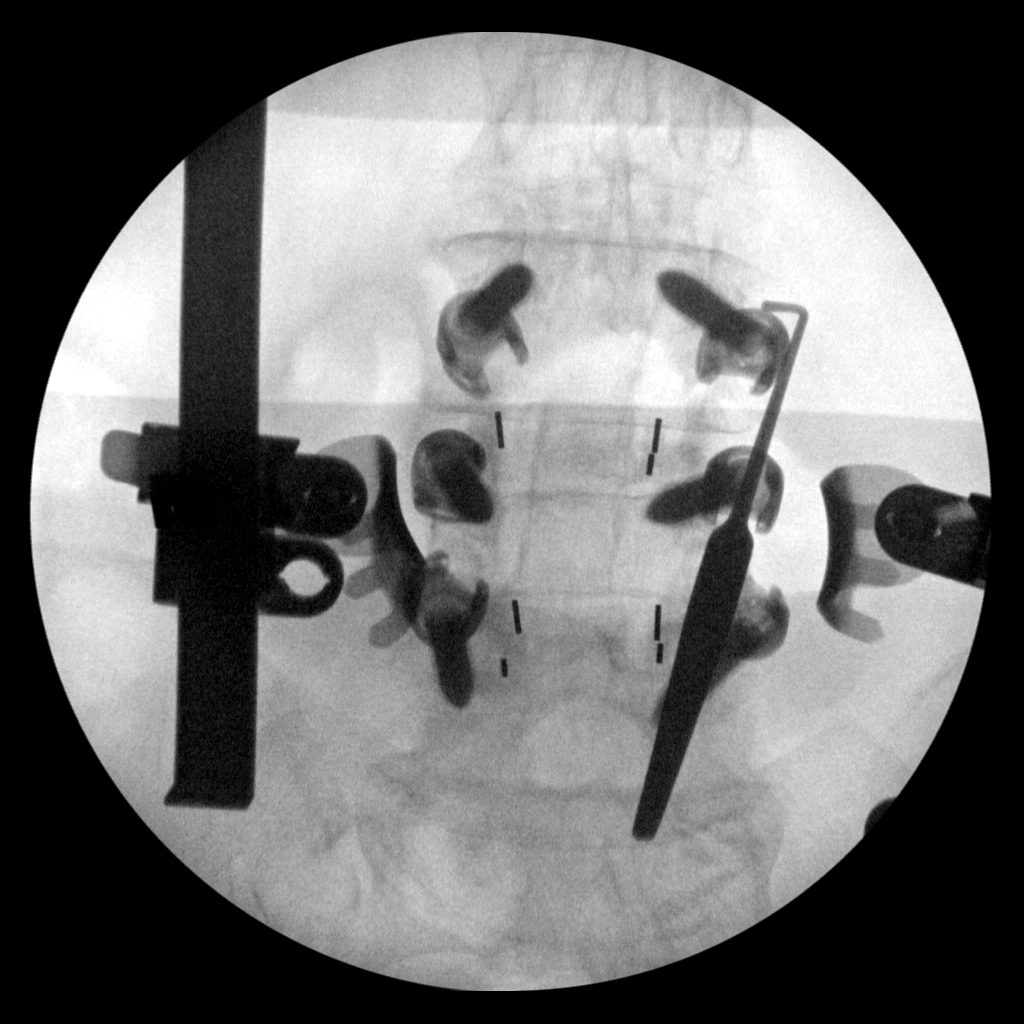

[2 of 2 positions shown; findings below may reference images not displayed]

FINDINGS: AP and lateral C-arm images demonstrate that the patient
is undergoing interbody and posterior fusion at L3-4 and L4-5.
Pedicle screws and interbody fusion devices appear in excellent
position.
IMPRESSION: Fusions performed at L3-4 and L4-5.

## 2013-02-05 IMAGING — CR DG LUMBAR SPINE 1V
1 series · 1 of 1 positions shown · non-contrast
Comparison: [DATE]

CLINICAL DATA: Posterior fusion at L3-4, L4-5.

EXAM:
LUMBAR SPINE - 1 VIEW

[lateral ext]
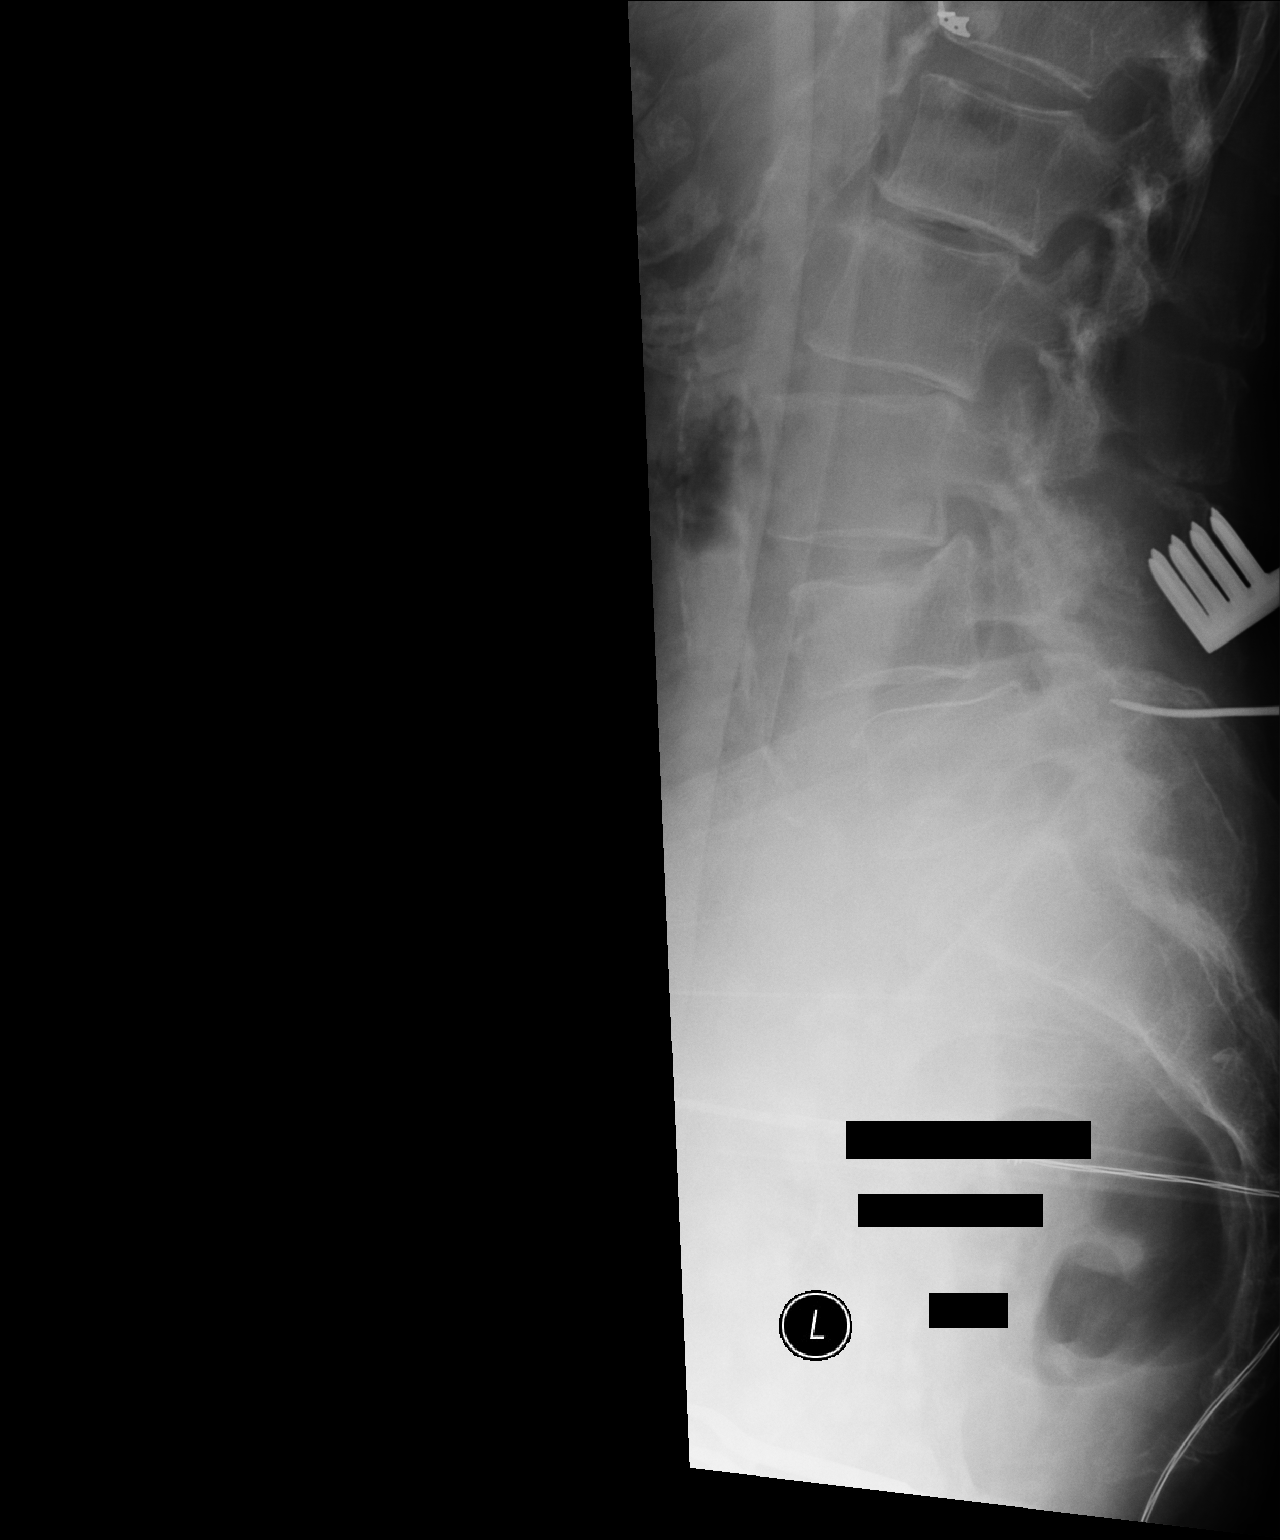

[1 of 1 positions shown; findings below may reference images not displayed]

FINDINGS: Single cross-table lateral view of the lumbar spine demonstrates a
posterior surgical instrument directed at the L4-5 level.
IMPRESSION: Intraoperative localization as above.

## 2013-02-05 SURGERY — POSTERIOR LUMBAR FUSION 2 LEVEL
Anesthesia: General | Site: Back | Wound class: Clean

## 2013-02-05 MED ORDER — PROPOFOL 10 MG/ML IV BOLUS
INTRAVENOUS | Status: DC | PRN
  Administered 2013-02-05: 150 mg via INTRAVENOUS

## 2013-02-05 MED ORDER — CEFAZOLIN SODIUM-DEXTROSE 2-3 GM-% IV SOLR
2.0000 g | Freq: Three times a day (TID) | INTRAVENOUS | Status: AC
  Administered 2013-02-05 – 2013-02-06 (×2): 2 g via INTRAVENOUS
  Filled 2013-02-05 (×3): qty 50

## 2013-02-05 MED ORDER — BUPIVACAINE LIPOSOME 1.3 % IJ SUSP: INTRAMUSCULAR | Status: DC | PRN

## 2013-02-05 MED ORDER — SODIUM CHLORIDE 0.9 % IV SOLN
INTRAVENOUS | Status: DC | PRN
  Administered 2013-02-05: 13:00:00 via INTRAVENOUS

## 2013-02-05 MED ORDER — FENTANYL CITRATE 0.05 MG/ML IJ SOLN
INTRAMUSCULAR | Status: AC
  Filled 2013-02-05: qty 2

## 2013-02-05 MED ORDER — GLYCOPYRROLATE 0.2 MG/ML IJ SOLN
INTRAMUSCULAR | Status: DC | PRN
  Administered 2013-02-05: 0.6 mg via INTRAVENOUS

## 2013-02-05 MED ORDER — FENTANYL CITRATE 0.05 MG/ML IJ SOLN
25.0000 ug | INTRAMUSCULAR | Status: DC | PRN
  Administered 2013-02-05: 25 ug via INTRAVENOUS

## 2013-02-05 MED ORDER — ONDANSETRON HCL 4 MG/2ML IJ SOLN
INTRAMUSCULAR | Status: DC | PRN
  Administered 2013-02-05: 4 mg via INTRAVENOUS

## 2013-02-05 MED ORDER — HYDROMORPHONE 0.3 MG/ML IV SOLN
INTRAVENOUS | Status: DC
  Administered 2013-02-05: 0.2 mg via INTRAVENOUS

## 2013-02-05 MED ORDER — LACTATED RINGERS IV SOLN
INTRAVENOUS | Status: DC
  Administered 2013-02-06: via INTRAVENOUS

## 2013-02-05 MED ORDER — LEVOTHYROXINE SODIUM 112 MCG PO TABS
112.0000 ug | ORAL_TABLET | Freq: Every day | ORAL | Status: DC
  Administered 2013-02-06 – 2013-02-08 (×4): 112 ug via ORAL
  Filled 2013-02-05 (×6): qty 1

## 2013-02-05 MED ORDER — FESOTERODINE FUMARATE ER 8 MG PO TB24
8.0000 mg | ORAL_TABLET | Freq: Every day | ORAL | Status: DC
  Administered 2013-02-05 – 2013-02-08 (×4): 8 mg via ORAL
  Filled 2013-02-05 (×5): qty 1

## 2013-02-05 MED ORDER — ACETAMINOPHEN 325 MG PO TABS
650.0000 mg | ORAL_TABLET | ORAL | Status: DC | PRN
  Administered 2013-02-06 – 2013-02-09 (×11): 650 mg via ORAL
  Filled 2013-02-05 (×11): qty 2

## 2013-02-05 MED ORDER — NALOXONE HCL 0.4 MG/ML IJ SOLN: 0.4000 mg | INTRAMUSCULAR | Status: DC | PRN

## 2013-02-05 MED ORDER — FENTANYL CITRATE 0.05 MG/ML IJ SOLN: 25.0000 ug | INTRAMUSCULAR | Status: DC | PRN

## 2013-02-05 MED ORDER — SURGIFOAM 100 EX MISC
CUTANEOUS | Status: DC | PRN
  Administered 2013-02-05 (×2): via TOPICAL

## 2013-02-05 MED ORDER — LACTATED RINGERS IV SOLN
INTRAVENOUS | Status: DC | PRN
  Administered 2013-02-05 (×4): via INTRAVENOUS

## 2013-02-05 MED ORDER — NEOSTIGMINE METHYLSULFATE 1 MG/ML IJ SOLN
INTRAMUSCULAR | Status: DC | PRN
  Administered 2013-02-05: 4 mg via INTRAVENOUS

## 2013-02-05 MED ORDER — SODIUM CHLORIDE 0.9 % IJ SOLN: 9.0000 mL | INTRAMUSCULAR | Status: DC | PRN

## 2013-02-05 MED ORDER — LISINOPRIL 5 MG PO TABS
5.0000 mg | ORAL_TABLET | Freq: Every day | ORAL | Status: DC
  Administered 2013-02-07: 5 mg via ORAL
  Filled 2013-02-05 (×5): qty 1

## 2013-02-05 MED ORDER — DIAZEPAM 5 MG PO TABS: 5.0000 mg | ORAL_TABLET | Freq: Four times a day (QID) | ORAL | Status: DC | PRN

## 2013-02-05 MED ORDER — DIPHENHYDRAMINE HCL 12.5 MG/5ML PO ELIX: 12.5000 mg | ORAL_SOLUTION | Freq: Four times a day (QID) | ORAL | Status: DC | PRN

## 2013-02-05 MED ORDER — ROCURONIUM BROMIDE 100 MG/10ML IV SOLN
INTRAVENOUS | Status: DC | PRN
  Administered 2013-02-05 (×3): 10 mg via INTRAVENOUS
  Administered 2013-02-05: 40 mg via INTRAVENOUS
  Administered 2013-02-05 (×3): 10 mg via INTRAVENOUS

## 2013-02-05 MED ORDER — LIDOCAINE HCL (CARDIAC) 20 MG/ML IV SOLN
INTRAVENOUS | Status: DC | PRN
  Administered 2013-02-05: 70 mg via INTRAVENOUS

## 2013-02-05 MED ORDER — 0.9 % SODIUM CHLORIDE (POUR BTL) OPTIME
TOPICAL | Status: DC | PRN
  Administered 2013-02-05: 1000 mL

## 2013-02-05 MED ORDER — DEXTROSE 5 % IV SOLN
INTRAVENOUS | Status: DC | PRN
  Administered 2013-02-05: 08:00:00 via INTRAVENOUS

## 2013-02-05 MED ORDER — ROSUVASTATIN CALCIUM 5 MG PO TABS
5.0000 mg | ORAL_TABLET | Freq: Every day | ORAL | Status: DC
  Administered 2013-02-05 – 2013-02-08 (×4): 5 mg via ORAL
  Filled 2013-02-05 (×5): qty 1

## 2013-02-05 MED ORDER — HYDROCODONE-ACETAMINOPHEN 5-325 MG PO TABS: 1.0000 | ORAL_TABLET | ORAL | Status: DC | PRN

## 2013-02-05 MED ORDER — ONDANSETRON HCL 4 MG/2ML IJ SOLN: 4.0000 mg | Freq: Four times a day (QID) | INTRAMUSCULAR | Status: DC | PRN

## 2013-02-05 MED ORDER — BUPIVACAINE LIPOSOME 1.3 % IJ SUSP
20.0000 mL | Freq: Once | INTRAMUSCULAR | Status: DC
  Filled 2013-02-05: qty 20

## 2013-02-05 MED ORDER — SODIUM CHLORIDE 0.9 % IV SOLN
10.0000 mg | INTRAVENOUS | Status: DC | PRN
  Administered 2013-02-05: 40 ug/min via INTRAVENOUS

## 2013-02-05 MED ORDER — PHENYLEPHRINE HCL 10 MG/ML IJ SOLN
INTRAMUSCULAR | Status: DC | PRN
  Administered 2013-02-05 (×6): 80 ug via INTRAVENOUS
  Administered 2013-02-05 (×2): 40 ug via INTRAVENOUS
  Administered 2013-02-05 (×2): 80 ug via INTRAVENOUS

## 2013-02-05 MED ORDER — FENTANYL CITRATE 0.05 MG/ML IJ SOLN
INTRAMUSCULAR | Status: DC | PRN
  Administered 2013-02-05 (×2): 50 ug via INTRAVENOUS
  Administered 2013-02-05: 25 ug via INTRAVENOUS
  Administered 2013-02-05: 75 ug via INTRAVENOUS
  Administered 2013-02-05 (×2): 25 ug via INTRAVENOUS

## 2013-02-05 MED ORDER — BUPIVACAINE-EPINEPHRINE PF 0.5-1:200000 % IJ SOLN
INTRAMUSCULAR | Status: DC | PRN
  Administered 2013-02-05: 10 mL

## 2013-02-05 MED ORDER — SODIUM CHLORIDE 0.9 % IR SOLN
Status: DC | PRN
  Administered 2013-02-05: 09:00:00

## 2013-02-05 MED ORDER — ONDANSETRON HCL 4 MG/2ML IJ SOLN
4.0000 mg | INTRAMUSCULAR | Status: DC | PRN
  Administered 2013-02-06 – 2013-02-08 (×3): 4 mg via INTRAVENOUS
  Filled 2013-02-05 (×3): qty 2

## 2013-02-05 MED ORDER — ALUM & MAG HYDROXIDE-SIMETH 200-200-20 MG/5ML PO SUSP: 30.0000 mL | Freq: Four times a day (QID) | ORAL | Status: DC | PRN

## 2013-02-05 MED ORDER — OXYCODONE-ACETAMINOPHEN 5-325 MG PO TABS
1.0000 | ORAL_TABLET | ORAL | Status: DC | PRN
  Filled 2013-02-05: qty 1

## 2013-02-05 MED ORDER — ATORVASTATIN CALCIUM 10 MG PO TABS
10.0000 mg | ORAL_TABLET | Freq: Every day | ORAL | Status: DC
  Filled 2013-02-05 (×2): qty 1

## 2013-02-05 MED ORDER — DOCUSATE SODIUM 100 MG PO CAPS
100.0000 mg | ORAL_CAPSULE | Freq: Two times a day (BID) | ORAL | Status: DC
  Administered 2013-02-05 – 2013-02-08 (×7): 100 mg via ORAL
  Filled 2013-02-05 (×6): qty 1

## 2013-02-05 MED ORDER — ALBUMIN HUMAN 5 % IV SOLN
INTRAVENOUS | Status: DC | PRN
  Administered 2013-02-05 (×2): via INTRAVENOUS

## 2013-02-05 MED ORDER — HYDROMORPHONE 0.3 MG/ML IV SOLN
INTRAVENOUS | Status: AC
  Filled 2013-02-05: qty 25

## 2013-02-05 MED ORDER — PHENOL 1.4 % MT LIQD: 1.0000 | OROMUCOSAL | Status: DC | PRN

## 2013-02-05 MED ORDER — MENTHOL 3 MG MT LOZG: 1.0000 | LOZENGE | OROMUCOSAL | Status: DC | PRN

## 2013-02-05 MED ORDER — ACETAMINOPHEN 650 MG RE SUPP: 650.0000 mg | RECTAL | Status: DC | PRN

## 2013-02-05 MED ORDER — DIPHENHYDRAMINE HCL 50 MG/ML IJ SOLN: 12.5000 mg | Freq: Four times a day (QID) | INTRAMUSCULAR | Status: DC | PRN

## 2013-02-05 SURGICAL SUPPLY — 79 items
BAG DECANTER FOR FLEXI CONT (MISCELLANEOUS) ×2 IMPLANT
BENZOIN TINCTURE PRP APPL 2/3 (GAUZE/BANDAGES/DRESSINGS) ×2 IMPLANT
BLADE SURG ROTATE 9660 (MISCELLANEOUS) IMPLANT
BRUSH SCRUB EZ PLAIN DRY (MISCELLANEOUS) ×2 IMPLANT
BUR ACORN 6.0 (BURR) ×2 IMPLANT
BUR MATCHSTICK NEURO 3.0 LAGG (BURR) ×2 IMPLANT
CANISTER SUCTION 2500CC (MISCELLANEOUS) ×2 IMPLANT
CAP REVERE LOCKING (Cap) ×12 IMPLANT
CLOTH BEACON ORANGE TIMEOUT ST (SAFETY) ×2 IMPLANT
CONN CROSSLINK REV 6.35 48-60 (Connector) ×2 IMPLANT
CONNECTOR CRSLNK REV6.35 48-60 (Connector) ×1 IMPLANT
CONT SPEC 4OZ CLIKSEAL STRL BL (MISCELLANEOUS) ×4 IMPLANT
CORDS BIPOLAR (ELECTRODE) ×2 IMPLANT
COVER BACK TABLE 24X17X13 BIG (DRAPES) IMPLANT
COVER TABLE BACK 60X90 (DRAPES) ×2 IMPLANT
DRAPE C-ARM 42X72 X-RAY (DRAPES) ×4 IMPLANT
DRAPE LAPAROTOMY 100X72X124 (DRAPES) ×2 IMPLANT
DRAPE POUCH INSTRU U-SHP 10X18 (DRAPES) ×2 IMPLANT
DRAPE PROXIMA HALF (DRAPES) ×2 IMPLANT
DRAPE SURG 17X23 STRL (DRAPES) ×8 IMPLANT
DURASEAL APPLICATOR TIP (TIP) ×2 IMPLANT
DURASEAL SPINE SEALANT 3ML (MISCELLANEOUS) ×2 IMPLANT
ELECT BLADE 4.0 EZ CLEAN MEGAD (MISCELLANEOUS) ×2
ELECT REM PT RETURN 9FT ADLT (ELECTROSURGICAL) ×2
ELECTRODE BLDE 4.0 EZ CLN MEGD (MISCELLANEOUS) ×1 IMPLANT
ELECTRODE REM PT RTRN 9FT ADLT (ELECTROSURGICAL) ×1 IMPLANT
GAUZE SPONGE 4X4 16PLY XRAY LF (GAUZE/BANDAGES/DRESSINGS) ×2 IMPLANT
GLOVE BIO SURGEON STRL SZ8.5 (GLOVE) ×4 IMPLANT
GLOVE BIOGEL PI IND STRL 7.0 (GLOVE) ×4 IMPLANT
GLOVE BIOGEL PI IND STRL 7.5 (GLOVE) ×1 IMPLANT
GLOVE BIOGEL PI INDICATOR 7.0 (GLOVE) ×4
GLOVE BIOGEL PI INDICATOR 7.5 (GLOVE) ×1
GLOVE ECLIPSE 7.5 STRL STRAW (GLOVE) ×4 IMPLANT
GLOVE ECLIPSE 8.0 STRL XLNG CF (GLOVE) ×2 IMPLANT
GLOVE EXAM NITRILE LRG STRL (GLOVE) ×4 IMPLANT
GLOVE EXAM NITRILE MD LF STRL (GLOVE) IMPLANT
GLOVE EXAM NITRILE XL STR (GLOVE) IMPLANT
GLOVE EXAM NITRILE XS STR PU (GLOVE) IMPLANT
GLOVE SS BIOGEL STRL SZ 6.5 (GLOVE) ×4 IMPLANT
GLOVE SS BIOGEL STRL SZ 8 (GLOVE) ×2 IMPLANT
GLOVE SUPERSENSE BIOGEL SZ 6.5 (GLOVE) ×4
GLOVE SUPERSENSE BIOGEL SZ 8 (GLOVE) ×2
GOWN BRE IMP SLV AUR LG STRL (GOWN DISPOSABLE) ×8 IMPLANT
GOWN BRE IMP SLV AUR XL STRL (GOWN DISPOSABLE) ×6 IMPLANT
GOWN STRL REIN 2XL LVL4 (GOWN DISPOSABLE) IMPLANT
KIT BASIN OR (CUSTOM PROCEDURE TRAY) ×2 IMPLANT
KIT ROOM TURNOVER OR (KITS) ×2 IMPLANT
NEEDLE HYPO 21X1.5 SAFETY (NEEDLE) ×2 IMPLANT
NEEDLE HYPO 22GX1.5 SAFETY (NEEDLE) ×2 IMPLANT
NS IRRIG 1000ML POUR BTL (IV SOLUTION) ×2 IMPLANT
PACK FOAM VITOSS 10CC (Orthopedic Implant) ×2 IMPLANT
PACK LAMINECTOMY NEURO (CUSTOM PROCEDURE TRAY) ×2 IMPLANT
PAD ARMBOARD 7.5X6 YLW CONV (MISCELLANEOUS) ×6 IMPLANT
PATTIES SURGICAL .5 X.5 (GAUZE/BANDAGES/DRESSINGS) ×2 IMPLANT
PATTIES SURGICAL .5 X1 (DISPOSABLE) ×4 IMPLANT
PATTIES SURGICAL 1X1 (DISPOSABLE) ×2 IMPLANT
PUTTY 10ML ACTIFUSE ABX (Putty) ×2 IMPLANT
PUTTY 5ML ACTIFUSE ABX (Putty) ×2 IMPLANT
ROD REVERE 6.35 CURVED 70MM (Rod) ×4 IMPLANT
SCREW 7.5X45MM (Screw) ×4 IMPLANT
SCREW 7.5X50MM (Screw) ×4 IMPLANT
SCREW REVERE 6.35 75X55MM (Screw) ×4 IMPLANT
SPACER SUSTAIN O SM1 10X22 10M (Spacer) ×8 IMPLANT
SPONGE GAUZE 4X4 12PLY (GAUZE/BANDAGES/DRESSINGS) ×2 IMPLANT
SPONGE LAP 4X18 X RAY DECT (DISPOSABLE) IMPLANT
SPONGE NEURO XRAY DETECT 1X3 (DISPOSABLE) IMPLANT
SPONGE SURGIFOAM ABS GEL 100 (HEMOSTASIS) ×2 IMPLANT
STRIP CLOSURE SKIN 1/2X4 (GAUZE/BANDAGES/DRESSINGS) ×2 IMPLANT
SUT PROLENE 6 0 BV (SUTURE) ×8 IMPLANT
SUT VIC AB 1 CT1 18XBRD ANBCTR (SUTURE) ×2 IMPLANT
SUT VIC AB 1 CT1 8-18 (SUTURE) ×2
SUT VIC AB 2-0 CP2 18 (SUTURE) ×4 IMPLANT
SYR 20CC LL (SYRINGE) ×2 IMPLANT
SYR 20ML ECCENTRIC (SYRINGE) ×2 IMPLANT
TAPE CLOTH SURG 4X10 WHT LF (GAUZE/BANDAGES/DRESSINGS) ×2 IMPLANT
TOWEL OR 17X24 6PK STRL BLUE (TOWEL DISPOSABLE) ×2 IMPLANT
TOWEL OR 17X26 10 PK STRL BLUE (TOWEL DISPOSABLE) ×2 IMPLANT
TRAY FOLEY CATH 14FRSI W/METER (CATHETERS) ×2 IMPLANT
WATER STERILE IRR 1000ML POUR (IV SOLUTION) ×2 IMPLANT

## 2013-02-05 NOTE — Anesthesia Postprocedure Evaluation (Signed)
  Anesthesia Post-op Note  Patient: Michele [name]  Procedure(s) Performed: Procedure(s) with comments: Lumbar Three-Four, Lumbar Four-Five laminectomies with posterior lateral arthrodesis and posterior segmental instrumentation and possible posterior lumbar interbody fusion (N/A) - POSTERIOR LUMBAR FUSION 2 LEVEL  Patient Location: PACU  Anesthesia Type:General  Level of Consciousness: awake  Airway and Oxygen Therapy: Patient Spontanous Breathing  Post-op Pain: mild  Post-op Assessment: Post-op Vital signs reviewed  Post-op Vital Signs: Reviewed  Complications: No apparent anesthesia complications

## 2013-02-05 NOTE — Progress Notes (Signed)
Orthopedic Tech Progress Note Patient Details:  Eather Chaires 09-03-1927 [id number]  Patient ID: Penni Homans, female   DOB: 07-19-1927, 79 y.o.   MRN: [id number] Brace order completed by Storm Frisk, Kevonta Phariss 02/05/2013, 5:17 PM

## 2013-02-05 NOTE — Plan of Care (Signed)
Problem: Consults Goal: Diagnosis - Spinal Surgery Thoraco/Lumbar Spine Fusion     

## 2013-02-05 NOTE — Anesthesia Procedure Notes (Signed)
Procedure Name: Intubation Date/Time: 02/05/2013 7:46 AM Performed by: Darcey Nora B Pre-anesthesia Checklist: Patient being monitored, Patient identified, Emergency Drugs available and Suction available Patient Re-evaluated:Patient Re-evaluated prior to inductionOxygen Delivery Method: Circle system utilized Preoxygenation: Pre-oxygenation with 100% oxygen Intubation Type: IV induction Ventilation: Mask ventilation without difficulty Laryngoscope Size: Mac and 3 Grade View: Grade I Tube type: Oral Tube size: 7.5 mm Number of attempts: 1 Airway Equipment and Method: Stylet Placement Confirmation: ETT inserted through vocal cords under direct vision,  breath sounds checked- equal and bilateral and positive ETCO2 Secured at: 21 (cm at teeth) cm Tube secured with: Tape Dental Injury: Teeth and Oropharynx as per pre-operative assessment

## 2013-02-05 NOTE — Op Note (Signed)
Brief history: The patient is a 79 year old white female who is complaining of back, hip and leg pain consistent with neurogenic claudication. She has failed medical management and was worked up with a lumbar MRI. This demonstrated a spondylolisthesis and spinal stenosis at L3-4 and L4-5. I discussed the various treatment options with the patient including surgery. She has weighed the risks, benefits, and alternatives surgery and decided proceed with an L3-4 and L4-5 decompression, instrumentation, and fusion.  Preoperative diagnosis: L3-4 and L4-5 spondylolisthesis, Degenerative disc disease, spinal stenosis compressing both the bilateral L3, L4 and L5 nerve roots; lumbago; lumbar radiculopathy, neurogenic claudication  Postoperative diagnosis: The same  Procedure: L3 and L4 laminectomy to decompress the bilateral L3, L4 and L5 nerve roots(the work required to do this was in addition to the work required to do the posterior lumbar interbody fusion because of the patient's spinal stenosis, facet arthropathy. Etc. requiring a wide decompression of the nerve roots.); L3-4 and L4-5 posterior lumbar interbody fusion with local morselized autograft bone and Actifusebone graft extender; insertion of interbody prosthesis at L3-4 and L4-5 (globus peek interbody prosthesis); posterior segmental instrumentation from L3 to L5 with globus titanium pedicle screws and rods; posterior lateral arthrodesis at L3-4 and L4-5 with local morselized autograft bone and Vitoss bone graft extender.  Surgeon: Dr. Delma Officer  Asst.: Dr. Aliene Beams  Anesthesia: Gen. endotracheal  Estimated blood loss: 400 cc  Drains: None  Complications: None  Description of procedure: The patient was brought to the operating room by the anesthesia team. General endotracheal anesthesia was induced. The patient was turned to the prone position on the Wilson frame. The patient's lumbosacral region was then prepared with Betadine scrub  and Betadine solution. Sterile drapes were applied.  I then injected the area to be incised with Marcaine with epinephrine solution. I then used the scalpel to make a linear midline incision over the L3-4 and L4-5 interspace. I then used electrocautery to perform a bilateral subperiosteal dissection exposing the spinous process and lamina of L2, L3, L4 and L5. We then obtained intraoperative radiograph to confirm our location. We then inserted the Verstrac retractor to provide exposure.  I then incised the interspinous ligament at L2-3, L3-4 and L4-5 with a scalpel. I removed the spinous process of L3 and L4 with a Leksell rongeur.  I began the decompression by using the high speed drill to perform laminotomies at L3 and L4 bilaterally. We then used the Kerrison punches to widen the laminotomy and removed the ligamentum flavum at L2-3, L3-4 and L4-5. We used the Kerrison punches to remove the medial facets at L3-4 and L4-5 bilaterally. We performed wide foraminotomies about the bilateral L3, L4 and L5 nerve roots completing the decompression.  We now turned our attention to the posterior lumbar interbody fusion. I used a scalpel to incise the intervertebral disc at L3-4 and L4-5. I then performed a partial intervertebral discectomy at L3-4 and L4-5 using the pituitary forceps. We prepared the vertebral endplates at L3-4 and L4-5 for the fusion by removing the soft tissues with the curettes. We then used the trial spacers to pick the appropriate sized interbody prosthesis. We prefilled his prosthesis with a combination of local morselized autograft bone that we obtained during the decompression as well as Actifuse bone graft extender. We inserted the prefilled prosthesis into the interspace at L3-4 and L4-5. There was a good snug fit of the prosthesis in the interspace. We then filled and the remainder of the intervertebral disc space  with local morselized autograft bone and Actifuse. This completed the  posterior lumbar interbody arthrodesis.  We now turned attention to the instrumentation. Under fluoroscopic guidance we cannulated the bilateral L3, L4 and L5 pedicles with the bone probe. We then removed the bone probe. We then tapped the pedicle with a 6.5 millimeter tap. We then removed the tap. We probed inside the tapped pedicle with a ball probe to rule out cortical breaches. We then inserted a 7.5 x 45, 50 and 55 millimeter pedicle screw into the L3, L4 and L5 pedicles bilaterally under fluoroscopic guidance. We then palpated along the medial aspect of the pedicles to rule out cortical breaches. There were none. The nerve roots were not injured. We then connected the unilateral pedicle screws with a lordotic rod. We compressed the construct and secured the rod in place with the caps. We then tightened the caps appropriately. We placed a cross connector between the rods. This completed the instrumentation from L3-L5.  We now turned our attention to the posterior lateral arthrodesis at L3-4 and L4-5. We used the high-speed drill to decorticate the remainder of the facets, pars, transverse process at L3-4 and L4-5. We then applied a combination of local morselized autograft bone and Vitoss bone graft extender over these decorticated posterior lateral structures. This completed the posterior lateral arthrodesis.  We then obtained hemostasis using bipolar electrocautery. We irrigated the wound out with bacitracin solution. We inspected the thecal sac and nerve roots and noted they were well decompressed. We then removed the retractor. We reapproximated patient's thoracolumbar fascia with interrupted #1 Vicryl suture. We reapproximated patient's subcutaneous tissue with interrupted 2-0 Vicryl suture. The reapproximated patient's skin with Steri-Strips and benzoin. The wound was then coated with bacitracin ointment. A sterile dressing was applied. The drapes were removed. The patient was subsequently returned  to the supine position where they were extubated by the anesthesia team. He was then transported to the post anesthesia care unit in stable condition. All sponge instrument and needle counts were reportedly correct at the end of this case.

## 2013-02-05 NOTE — Progress Notes (Signed)
Patient ID: Michele [name], female   DOB: 1928/01/08, 79 y.o.   MRN: [id number] Subjective:  The patient is somnolent but arousable.  Objective: Vital signs in last 24 hours: Temp:  [97.2 F (36.2 C)-98.8 F (37.1 C)] 98.8 F (37.1 C) (09/18 1351) Pulse Rate:  [72] 72 (09/18 0618) Resp:  [16] 16 (09/18 0618) BP: (132)/(64) 132/64 mmHg (09/18 0618) SpO2:  [100 %] 100 % (09/18 0618)  Intake/Output from previous day:   Intake/Output this shift: Total I/O In: 3775 [I.V.:3150; Blood:125; IV Piggyback:500] Out: 920 [Urine:520; Blood:400]  Physical exam the patient is moving all 4 extremities well.  Lab Results:  Recent Labs  02/05/13 1247  HGB 7.8*  HCT 23.0*   BMET  Recent Labs  02/05/13 1247  NA 141  K 3.9  GLUCOSE 102*    Studies/Results: Dg Lumbar Spine 1 View  02/05/2013   CLINICAL DATA:  Posterior fusion at L3-4, L4-5.  EXAM: LUMBAR SPINE - 1 VIEW  COMPARISON:  12/30/2012  FINDINGS: Single cross-table lateral view of the lumbar spine demonstrates a posterior surgical instrument directed at the L4-5 level.  IMPRESSION: Intraoperative localization as above.   Electronically Signed   By: Charlett Nose M.D.   On: 02/05/2013 14:16    Assessment/Plan: The patient is doing well.  LOS: 0 days     Michele [name] D 02/05/2013, 2:26 PM

## 2013-02-05 NOTE — Transfer of Care (Signed)
Immediate Anesthesia Transfer of Care Note  Patient: Michele [name]  Procedure(s) Performed: Procedure(s) with comments: Lumbar Three-Four, Lumbar Four-Five laminectomies with posterior lateral arthrodesis and posterior segmental instrumentation and possible posterior lumbar interbody fusion (N/A) - POSTERIOR LUMBAR FUSION 2 LEVEL  Patient Location: PACU  Anesthesia Type:General  Level of Consciousness: awake, alert , oriented and patient cooperative  Airway & Oxygen Therapy: Patient Spontanous Breathing and Patient connected to face mask oxygen  Post-op Assessment: Report given to PACU RN and Post -op Vital signs reviewed and stable  Post vital signs: Reviewed and stable  Complications: No apparent anesthesia complications

## 2013-02-05 NOTE — Anesthesia Preprocedure Evaluation (Addendum)
Anesthesia Evaluation  Patient identified by MRN, date of birth, ID band Patient awake    Reviewed: Allergy & Precautions, H&P , NPO status , Patient's Chart, lab work & pertinent test results, reviewed documented beta blocker date and time   History of Anesthesia Complications (+) PONV  Airway Mallampati: I TM Distance: >3 FB Neck ROM: Full    Dental  (+) Teeth Intact, Caps and Dental Advisory Given   Pulmonary neg pulmonary ROS,  breath sounds clear to auscultation        Cardiovascular hypertension, Pt. on medications Rhythm:Regular Rate:Normal  Hx phlebitis approx 5 yrs ago; on plavis.  LD Plavix 1 wk ago   Neuro/Psych    GI/Hepatic Neg liver ROS, GERD-  ,  Endo/Other  Hypothyroidism   Renal/GU negative Renal ROS     Musculoskeletal   Abdominal   Peds  Hematology   Anesthesia Other Findings   Reproductive/Obstetrics                          Anesthesia Physical Anesthesia Plan  ASA: III  Anesthesia Plan: General   Post-op Pain Management:    Induction: Intravenous  Airway Management Planned: Oral ETT  Additional Equipment:   Intra-op Plan:   Post-operative Plan: Extubation in OR  Informed Consent: I have reviewed the patients History and Physical, chart, labs and discussed the procedure including the risks, benefits and alternatives for the proposed anesthesia with the patient or authorized representative who has indicated his/her understanding and acceptance.   Dental advisory given  Plan Discussed with: CRNA, Anesthesiologist and Surgeon  Anesthesia Plan Comments:         Anesthesia Quick Evaluation

## 2013-02-05 NOTE — Preoperative (Signed)
Beta Blockers   Reason not to administer Beta Blockers:Not Applicable 

## 2013-02-05 NOTE — H&P (Signed)
Subjective: The patient is an 79 year old white female who has complained of back, hip, and leg pain consistent with neurogenic claudication. She has failed medical management and was worked up with a lumbar MRI. This demonstrated spondylolisthesis and spinal stenosis at L3-4 and L4-5. I discussed the various treatment options with the patient including surgery. She has weighed the risks, benefits, and alternatives surgery and decided proceed with a lumbar decompression and fusion.   Past Medical History  Diagnosis Date  . PONV (postoperative nausea and vomiting)   . Hypertension     takes Lisinopril daily  . Hyperlipidemia     takes crestor daily  . Carotid bruit     right side more so than left;but per pt not enough to clean out  . Phlebitis     hx of-19yrs ago right leg;takes Plavix daily but on hold for surgery  . History of migraine     stopped with menopause  . Numbness     left foot  . Arthritis   . Joint pain   . Joint swelling   . Back pain   . Osteopenia   . GERD (gastroesophageal reflux disease)     related to some meds;but doesn't take any medications  . Internal hemorrhoids   . Diverticulosis     right  . Urinary frequency   . Urinary urgency   . Nocturia   . Anemia     yrs ago  . Macular degeneration     dry  . Hypothyroidism     takes Synthroid daily  . Constipation     metamucil bid    Past Surgical History  Procedure Laterality Date  . Dilation and curettage of uterus    . Removal of neck cyst  1996  . Removal of breast cyst  1998  . Removal of tubes and ovaries    . Colonoscopy      Allergies  Allergen Reactions  . Aspirin Other (See Comments)    REACTION: unknown  . Codeine Nausea And Vomiting  . Morphine And Related Nausea And Vomiting  . Other Nausea And Vomiting    AGENT:  anesthesia  . Scallops [Shellfish Allergy] Nausea And Vomiting and Other (See Comments)    OTHER REACTIONS: headaches  . Sulfa Antibiotics Other (See Comments)     REACTION: unknown    History  Substance Use Topics  . Smoking status: Never Smoker   . Smokeless tobacco: Not on file  . Alcohol Use: No    History reviewed. No pertinent family history. Prior to Admission medications   Medication Sig Start Date End Date Taking? Authorizing Provider  calcium-vitamin D (OSCAL WITH D) 500-200 MG-UNIT per tablet Take 1 tablet by mouth every morning.   Yes Historical Provider, MD  clopidogrel (PLAVIX) 75 MG tablet Take 75 mg by mouth at bedtime.   Yes Historical Provider, MD  Boris Lown Oil 300 MG CAPS Take 300 mg by mouth every morning.   Yes Historical Provider, MD  levothyroxine (SYNTHROID, LEVOTHROID) 112 MCG tablet Take 112 mcg by mouth at bedtime.   Yes Historical Provider, MD  lisinopril (PRINIVIL,ZESTRIL) 5 MG tablet Take 5 mg by mouth at bedtime.   Yes Historical Provider, MD  Multiple Vitamins-Minerals (ICAPS) TABS Take 1 tablet by mouth every morning.   Yes Historical Provider, MD  potassium chloride SA (K-DUR,KLOR-CON) 20 MEQ tablet Take 20 mEq by mouth every morning.   Yes Historical Provider, MD  psyllium (METAMUCIL) 58.6 % powder Take 1 packet by mouth  2 (two) times daily.   Yes Historical Provider, MD  rosuvastatin (CRESTOR) 5 MG tablet Take 5 mg by mouth at bedtime.   Yes Historical Provider, MD  tolterodine (DETROL LA) 4 MG 24 hr capsule Take 4 mg by mouth at bedtime.   Yes Historical Provider, MD     Review of Systems  Positive ROS: As above  All other systems have been reviewed and were otherwise negative with the exception of those mentioned in the HPI and as above.  Objective: Vital signs in last 24 hours: Temp:  [97.2 F (36.2 C)] 97.2 F (36.2 C) (09/18 0618) Pulse Rate:  [72] 72 (09/18 0618) Resp:  [16] 16 (09/18 0618) BP: (132)/(64) 132/64 mmHg (09/18 0618) SpO2:  [100 %] 100 % (09/18 0618)  General Appearance: Alert, cooperative, no distress, appears stated age Head: Normocephalic, without obvious abnormality,  atraumatic Eyes: PERRL, conjunctiva/corneas clear, EOM's intact, fundi benign, both eyes      Ears: Normal TM's and external ear canals, both ears Throat: Lips, mucosa, and tongue normal; teeth and gums normal Neck: Supple, symmetrical, trachea midline, no adenopathy; thyroid: No enlargement/tenderness/nodules; no carotid bruit or JVD Back: Symmetric, no curvature, ROM normal, no CVA tenderness Lungs: Clear to auscultation bilaterally, respirations unlabored Heart: Regular rate and rhythm, S1 and S2 normal, no murmur, rub or gallop Abdomen: Soft, non-tender, bowel sounds active all four quadrants, no masses, no organomegaly Extremities: Extremities normal, atraumatic, no cyanosis or edema Pulses: 2+ and symmetric all extremities Skin: Skin color, texture, turgor normal, no rashes or lesions  NEUROLOGIC:   Mental status: alert and oriented, no aphasia, good attention span, Fund of knowledge/ memory ok Motor Exam - grossly normal Sensory Exam - grossly normal Reflexes:  Coordination - grossly normal Gait - grossly normal Balance - grossly normal Cranial Nerves: I: smell Not tested  II: visual acuity  OS: None    OD: None   II: visual fields Full to confrontation  II: pupils Equal, round, reactive to light  III,VII: ptosis None  III,IV,VI: extraocular muscles  Full ROM  V: mastication Normal  V: facial light touch sensation  Normal  V,VII: corneal reflex  Present  VII: facial muscle function - upper  Normal  VII: facial muscle function - lower Normal  VIII: hearing Not tested  IX: soft palate elevation  Normal  IX,X: gag reflex Present  XI: trapezius strength  5/5  XI: sternocleidomastoid strength 5/5  XI: neck flexion strength  5/5  XII: tongue strength  Normal    Data Review Lab Results  Component Value Date   WBC 7.8 02/02/2013   HGB 11.9* 02/02/2013   HCT 35.3* 02/02/2013   MCV 90.5 02/02/2013   PLT 225 02/02/2013   Lab Results  Component Value Date   NA 135  02/02/2013   K 4.0 02/02/2013   CL 101 02/02/2013   CO2 26 02/02/2013   BUN 24* 02/02/2013   CREATININE 0.90 02/02/2013   GLUCOSE 89 02/02/2013   No results found for this basename: INR, PROTIME    Assessment/Plan: L3-4 and L4-5 spinal stenosis, spondylolisthesis, lumbago, lumbar radiculopathy: I have discussed situation with the patient. I reviewed her MR scan with her and pointed out the abnormalities. We have discussed the various treatment options including surgery. I have described the surgical treatment option of an L3-4 and L4-5 decompression, each patient, and fusion. I have shown her surgical models. We have discussed the risks, benefits, alternatives, and likelihood of achieving our goals with surgery. I  have answered all the patient's questions. She has decided to proceed with surgery.   Kiwana Deblasi D 02/05/2013 7:26 AM

## 2013-02-06 LAB — CBC
Hemoglobin: 8.3 g/dL — ABNORMAL LOW (ref 12.0–15.0)
MCH: 30.4 pg (ref 26.0–34.0)
MCV: 89 fL (ref 78.0–100.0)
RBC: 2.73 MIL/uL — ABNORMAL LOW (ref 3.87–5.11)
WBC: 8.9 10*3/uL (ref 4.0–10.5)

## 2013-02-06 LAB — BASIC METABOLIC PANEL
CO2: 25 mEq/L (ref 19–32)
Calcium: 8.1 mg/dL — ABNORMAL LOW (ref 8.4–10.5)
Chloride: 102 mEq/L (ref 96–112)
Creatinine, Ser: 0.85 mg/dL (ref 0.50–1.10)
Glucose, Bld: 123 mg/dL — ABNORMAL HIGH (ref 70–99)
Sodium: 138 mEq/L (ref 135–145)

## 2013-02-06 MED ORDER — HYDROMORPHONE HCL 2 MG PO TABS: 2.0000 mg | ORAL_TABLET | ORAL | Status: DC | PRN

## 2013-02-06 MED FILL — Sodium Chloride IV Soln 0.9%: INTRAVENOUS | Qty: 1000 | Status: AC

## 2013-02-06 MED FILL — Heparin Sodium (Porcine) Inj 1000 Unit/ML: INTRAMUSCULAR | Qty: 30 | Status: AC

## 2013-02-06 NOTE — Plan of Care (Signed)
Problem: Consults Goal: Diagnosis - Spinal Surgery Outcome: Completed/Met Date Met:  02/06/13 Thoraco/Lumbar Spine Fusion     

## 2013-02-06 NOTE — Progress Notes (Signed)
UR COMPLETED  

## 2013-02-06 NOTE — Evaluation (Signed)
Physical Therapy Evaluation Patient Details Name: Michele [name] MRN: [id number] DOB: Dec 02, 1927 Today's Date: 02/06/2013 Time: 4098-1191 PT Time Calculation (min): 43 min  PT Assessment / Plan / Recommendation History of Present Illness  Patienti s an 79 y/o female admitted with spondylolisthesis and stenosis now s/p L3-4 laminectomy and L3-4, L4-5 PLIF.  Clinical Impression  Patient presents with decreased mobility due to deficits listed below and will benefit from skilled PT in the acute setting to allow return home with supervision help only.  Had planned SNF prior to home, but feel if she progresses as nicely as she was able to do today would be able to go home with supervision help.  Did have some lightheadedness and nausea with mobility today with BP 108/40.  RN aware.  Will need HHPT if she does go home.    PT Assessment  Patient needs continued PT services    Follow Up Recommendations  Home health PT;Supervision/Assistance - 24 hour    Does the patient have the potential to tolerate intense rehabilitation    N/A  Barriers to Discharge Decreased caregiver support spouse frail and cannot provide physical assist    Equipment Recommendations  None recommended by PT    Recommendations for Other Services   N/A  Frequency Min 6X/week    Precautions / Restrictions Precautions Precautions: Back Precaution Booklet Issued: Yes (comment) Required Braces or Orthoses: Spinal Brace Spinal Brace: Lumbar corset;Applied in sitting position   Pertinent Vitals/Pain More pain in bladder than back due to foley      Mobility  Bed Mobility Bed Mobility: Rolling Right;Right Sidelying to Sit Rolling Right: 4: Min guard;With rail Right Sidelying to Sit: 4: Min guard;With rails Transfers Transfers: Sit to Stand;Stand to Sit Sit to Stand: 4: Min guard;From bed;From toilet;4: Min assist Stand to Sit: 4: Min guard;To toilet;To chair/3-in-1 Details for Transfer Assistance: assist and cues  for technique and safety with hand placement Ambulation/Gait Ambulation/Gait Assistance: 4: Min guard Ambulation Distance (Feet): 100 Feet Assistive device: Rolling walker Gait Pattern: Step-through pattern;Decreased stride length;Trunk flexed        PT Diagnosis: Acute pain;Difficulty walking  PT Problem List: Decreased strength;Decreased activity tolerance;Decreased balance;Decreased mobility;Decreased knowledge of precautions;Decreased safety awareness;Decreased knowledge of use of DME PT Treatment Interventions: DME instruction;Balance training;Gait training;Stair training;Functional mobility training;Patient/family education;Therapeutic activities;Therapeutic exercise     PT Goals(Current goals can be found in the care plan section) Acute Rehab PT Goals Patient Stated Goal: To be able to go home PT Goal Formulation: With patient/family Time For Goal Achievement: 02/13/13 Potential to Achieve Goals: Good  Visit Information  Last PT Received On: 02/06/13 Assistance Needed: +1 History of Present Illness: Patienti s an 79 y/o female admitted with spondylolisthesis and stenosis now s/p L3-4 laminectomy and L3-4, L4-5 PLIF.       Prior Functioning  Home Living Family/patient expects to be discharged to:: Private residence Living Arrangements: Spouse/significant other Available Help at Discharge: Family Type of Home: House Home Access: Stairs to enter [employer] of Steps: 2 Entrance Stairs-Rails: None Home Layout: Two level;Full bath on main level;Able to live on main level with bedroom/bathroom Home Equipment: Gilmer Mor - single point;Walker - 4 wheels Prior Function Level of Independence: Independent with assistive device(s) Communication Communication: No difficulties Dominant Hand: Right    Cognition  Cognition Arousal/Alertness: Awake/alert Behavior During Therapy: WFL for tasks assessed/performed Overall Cognitive Status: Within Functional Limits for tasks  assessed    Extremity/Trunk Assessment Upper Extremity Assessment Upper Extremity Assessment: Defer to OT evaluation  Lower Extremity Assessment Lower Extremity Assessment: Overall WFL for tasks assessed   Balance Balance Balance Assessed: Yes Static Standing Balance Static Standing - Balance Support: During functional activity;Left upper extremity supported Static Standing - Level of Assistance: 5: Stand by assistance Static Standing - Comment/# of Minutes: brushing teeth at sink  End of Session PT - End of Session Equipment Utilized During Treatment: Back brace Activity Tolerance: Patient tolerated treatment well Patient left: in chair;with family/visitor present;with call bell/phone within reach Nurse Communication: Mobility status (BP)  GP     Dmario Russom,CYNDI 02/06/2013, 10:32 AM  Sheran Lawless, PT [phone] 02/06/2013

## 2013-02-06 NOTE — Progress Notes (Signed)
Pt's PCA pump observed to be constantly beeping due to respirations of 6 and below,pt is barely using the PCA medication and was concern about inability to sleep due to the beeping, Dr Clayton Lefort call) paged and notified,called back and ordered to D/C the PCA,same done at 0110,pt denies any discomfort at this time,will however continue to monitor. Michele [name], Michele [name]

## 2013-02-06 NOTE — Clinical Documentation Improvement (Signed)
THIS DOCUMENT IS NOT A PERMANENT PART OF THE MEDICAL RECORD  Please update your documentation with the medical record to reflect your response to this query. If you need help knowing how to do this please call [phone].  02/06/13  Dear Dr. Lovell Sheehan,  In an effort to better capture your patient's severity of illness, reflect appropriate length of stay and utilization of resources, a review of the patient medical record has revealed the following indicators.   Based on your clinical judgment, please clarify and document in a progress note and/or discharge summary the clinical condition associated with the following supporting information: In responding to this query please exercise your independent judgment.  The fact that a query is asked, does not imply that any particular answer is desired or expected.   Hello Dr. Lovell Sheehan!  Abnormal findings (laboratory, x-ray, MRI/CT scans, and other diagnostic results) are not coded and reported unless the physician indicates their clinical significance. The medical record reflects the following clinical findings. If possible, please help by clarifying the diagnostic and/or clinical significance of these abnormal findings. Thank you for your time!  Component      Hemoglobin HCT  Latest Ref Rng      12.0 - 15.0 g/dL 09.8 - 11.9 %  1/47/8295      11.9 (L) 35.3 (L)  02/05/2013     12:47 PM 7.8 (L) 23.0 (L)  02/05/2013      3:50 PM 8.7 (L) 25.6 (L)  02/06/2013      8.3 (L) 24.3 (L)    Possible Clinical Conditions?  - Expected Acute Blood Loss Anemia  - Acute Blood Loss Anemia  - Other condition (please document in the progress notes and/or discharge summary)  - Cannot Clinically determine at this time   Additional Supporting Information:  - EBL =  - Intraoperative Cell Saver Given =     No additional documentation in chart upon review. SM   Thank You,  Saul Fordyce  Clinical Documentation  Specialist: [phone] Health Information Management [place]

## 2013-02-06 NOTE — Evaluation (Signed)
Occupational Therapy Evaluation Patient Details Name: Michele [name] MRN: [id number] DOB: February 09, 1928 Today's Date: 02/06/2013 Time: 4034-7425 OT Time Calculation (min): 31 min  OT Assessment / Plan / Recommendation History of present illness 79 yo female s/p L3-4 laminectomry and L3-4 L4-5 PLIF   Clinical Impression   Patient is s/p PLIF 2 level surgery resulting in functional limitations due to the deficits listed below (see OT problem list).  Patient will benefit from skilled OT acutely to increase independence and safety with ADLS to allow discharge HHOT.     OT Assessment  Patient needs continued OT Services    Follow Up Recommendations  Home health OT    Barriers to Discharge      Equipment Recommendations   (none currently but to be assessed further)    Recommendations for Other Services    Frequency  Min 2X/week    Precautions / Restrictions Precautions Precautions: Back Precaution Booklet Issued: Yes (comment) Precaution Comments: back handout present recalling 1 out 3 precautions Required Braces or Orthoses: Spinal Brace Spinal Brace: Lumbar corset;Applied in sitting position   Pertinent Vitals/Pain Fatigue Nausea s/p eating a cracker and drinking Pt with prolonged burping without vomitting    ADL  Grooming: Wash/dry hands;Set up Where Assessed - Grooming: Supported sitting Upper Body Dressing: Supervision/safety (don doff brace- mod v/c instructions) Where Assessed - Upper Body Dressing: Supported sitting Lower Body Dressing: Minimal assistance Where Assessed - Lower Body Dressing: Supported sitting (able to cross bil LE for LB dressing Lt stronger than Rt) Toilet Transfer: Minimal assistance Toilet Transfer Method: Sit to Barista: Raised toilet seat with arms (or 3-in-1 over toilet) Toileting - Clothing Manipulation and Hygiene: Minimal assistance Where Assessed - Toileting Clothing Manipulation and Hygiene: Sit to stand from  3-in-1 or toilet Equipment Used: Back brace;Rolling walker Transfers/Ambulation Related to ADLs: Pt sitting in chair and feeling poorly,. Pt returned to bed. Pt stand pivot to the right side supervision ADL Comments: Pt sitting in chair on arrival. Pt and family educated on back precautions with adls. Daughter and spouse educated on house setup for return home. Pt with need for repetition of instructions. Pt able to cross bil LE with Rt LE being weaker. Pt demonstrated don / doff brace in chair min (A) for the first time. Pt positioned in bed at end of session     OT Diagnosis: Generalized weakness;Acute pain  OT Problem List: Decreased strength;Decreased activity tolerance;Impaired balance (sitting and/or standing);Decreased safety awareness;Decreased knowledge of use of DME or AE;Decreased knowledge of precautions;Pain OT Treatment Interventions:     OT Goals(Current goals can be found in the care plan section) Acute Rehab OT Goals Patient Stated Goal: To be able to go home OT Goal Formulation: With patient Time For Goal Achievement: 02/20/13 Potential to Achieve Goals: Good  Visit Information  Last OT Received On: 02/06/13 Assistance Needed: +1 History of Present Illness: 79 yo female s/p L3-4 laminectomry and L3-4 L4-5 PLIF       Prior Functioning     Home Living Family/patient expects to be discharged to:: Private residence Living Arrangements: Spouse/significant other Available Help at Discharge: Family Type of Home: House Home Access: Stairs to enter Secretary/administrator of Steps: 2 Entrance Stairs-Rails: None Home Layout: Two level;Full bath on main level;Able to live on main level with bedroom/bathroom Home Equipment: Gilmer Mor - single point;Walker - 4 wheels;Adaptive equipment Adaptive Equipment: Reacher Prior Function Level of Independence: Independent with assistive device(s) Communication Communication: No difficulties Dominant Hand: Right  Vision/Perception Vision - History Baseline Vision: Wears glasses all the time Patient Visual Report: No change from baseline   Cognition  Cognition Arousal/Alertness: Awake/alert Behavior During Therapy: WFL for tasks assessed/performed Overall Cognitive Status: Within Functional Limits for tasks assessed    Extremity/Trunk Assessment Upper Extremity Assessment Upper Extremity Assessment: Overall WFL for tasks assessed (arthritis at the hands) Lower Extremity Assessment Lower Extremity Assessment: Defer to PT evaluation Cervical / Trunk Assessment Cervical / Trunk Assessment: Kyphotic     Mobility Bed Mobility Bed Mobility: Sit to Supine;Sit to Sidelying Left Rolling Right: 4: Min guard;With rail Right Sidelying to Sit: 4: Min guard;With rails Sit to Supine: 4: Min assist;HOB flat Sit to Sidelying Left: 4: Min assist;HOB flat Details for Bed Mobility Assistance: Pt required min (A) for bil LE but with good sequence and log roll. Transfers Transfers: Sit to Stand;Stand to Sit Sit to Stand: 4: Min assist;With upper extremity assist;From bed Stand to Sit: 4: Min guard;With upper extremity assist;To bed Details for Transfer Assistance: cues for hand placement and prevent pulling up on RW. Pt encouraged to reach for bed surface with hands to control descend.      Exercise     Balance Balance Balance Assessed: Yes Static Standing Balance Static Standing - Balance Support: During functional activity;Left upper extremity supported Static Standing - Level of Assistance: 5: Stand by assistance Static Standing - Comment/# of Minutes: brushing teeth at sink   End of Session OT - End of Session Activity Tolerance: Patient limited by fatigue Patient left: in bed;with call bell/phone within reach;with family/visitor present Nurse Communication: Mobility status;Precautions  GO     [name] 02/06/2013, 11:38 AM Pager: [phone]

## 2013-02-06 NOTE — Progress Notes (Signed)
Patient ID: Michele [name], female   DOB: 07-29-1927, 79 y.o.   MRN: [id number] Subjective:  The patient is alert and pleasant. She looks well. Her back is sore.  Objective: Vital signs in last 24 hours: Temp:  [97.7 F (36.5 C)-98.8 F (37.1 C)] 98 F (36.7 C) (09/19 0625) Pulse Rate:  [70-96] 94 (09/19 0625) Resp:  [7-23] 18 (09/19 0625) BP: (87-137)/(40-76) 130/40 mmHg (09/19 0625) SpO2:  [90 %-100 %] 98 % (09/19 0625) Weight:  [64.864 kg (143 lb)] 64.864 kg (143 lb) (09/18 1730)  Intake/Output from previous day: 09/18 0701 - 09/19 0700 In: 4025 [I.V.:3400; Blood:125; IV Piggyback:500] Out: 1220 [Urine:820; Blood:400] Intake/Output this shift:    Physical exam the patient is alert and oriented x3. Her strength is normal in her lower extremities. Her dressing is clean and dry.  Lab Results:  Recent Labs  02/05/13 1247 02/05/13 1550  WBC  --  10.9*  HGB 7.8* 8.7*  HCT 23.0* 25.6*  PLT  --  141*   BMET  Recent Labs  02/05/13 1247  NA 141  K 3.9  GLUCOSE 102*    Studies/Results: Dg Lumbar Spine 2-3 Views  02/05/2013   *RADIOLOGY REPORT*  Clinical Data: Spondylolisthesis and spinal stenosis.  LUMBAR SPINE - 2-3 VIEW  Comparison: Lumbar MRI dated 12/02/2012  Findings: AP and lateral C-arm images demonstrate that the patient is undergoing interbody and posterior fusion at L3-4 and L4-5. Pedicle screws and interbody fusion devices appear in excellent position.  IMPRESSION: Fusions performed at L3-4 and L4-5.   Original Report Authenticated By: Francene Boyers, M.D.   Dg Lumbar Spine 1 View  02/05/2013   CLINICAL DATA:  Posterior fusion at L3-4, L4-5.  EXAM: LUMBAR SPINE - 1 VIEW  COMPARISON:  12/30/2012  FINDINGS: Single cross-table lateral view of the lumbar spine demonstrates a posterior surgical instrument directed at the L4-5 level.  IMPRESSION: Intraoperative localization as above.   Electronically Signed   By: Charlett Nose M.D.   On: 02/05/2013 14:16     Assessment/Plan: Postop day #1: We will mobilize the patient with PT and OT. I will discontinue her PCA pump.  LOS: 1 day     Samie Reasons D 02/06/2013, 7:56 AM

## 2013-02-07 NOTE — Progress Notes (Signed)
Physical Therapy Treatment Patient Details Name: Michele [name] MRN: [id number] DOB: 05/18/1928 Today's Date: 02/07/2013 Time: 1020-1050 PT Time Calculation (min): 30 min  PT Assessment / Plan / Recommendation  History of Present Illness 79 yo female s/p L3-4 laminectomry and L3-4 L4-5 PLIF   PT Comments   Pt admitted with above. Pt currently with functional limitations due to continued need for education for back precautions and education re: steps.  Pt will benefit from skilled PT to increase their independence and safety with mobility to allow discharge to the venue listed below.    Follow Up Recommendations  Home health PT;Supervision/Assistance - 24 hour                 Equipment Recommendations  Rolling walker with 5" wheels;3in1 (PT)        Frequency Min 6X/week   Progress towards PT Goals Progress towards PT goals: Progressing toward goals  Plan Current plan remains appropriate    Precautions / Restrictions Precautions Precautions: Back Precaution Booklet Issued: Yes (comment) Precaution Comments: back handout present recalling 2 out 3 precautions Required Braces or Orthoses: Spinal Brace Spinal Brace: Lumbar corset;Applied in sitting position Restrictions Weight Bearing Restrictions: No   Pertinent Vitals/Pain VSS, Some back pain per pt and called nursing for meds.      Mobility  Bed Mobility Bed Mobility: Not assessed Transfers Transfers: Sit to Stand;Stand to Sit Sit to Stand: 4: Min guard;With upper extremity assist;From chair/3-in-1;With armrests Stand to Sit: 4: Min guard;With upper extremity assist;To chair/3-in-1;With armrests Details for Transfer Assistance: cues for hand placement and prevent pulling up on RW. Pt encouraged to reach for chair surface with hands to control descent.  Ambulation/Gait Ambulation/Gait Assistance: 4: Min guard Ambulation Distance (Feet): 450 Feet Assistive device: Rolling walker Ambulation/Gait Assistance Details: Pt  ambulating safely with RW with only cue to look up.   Gait Pattern: Step-through pattern;Decreased stride length;Trunk flexed Gait velocity: decreased Stairs: Yes Stairs Assistance: 4: Min assist Stairs Assistance Details (indicate cue type and reason): Pt and husband practiced x2 and needed cues for technique and sequencing.  May want to practice again prior to d/c.  Gave handout as well.   Stair Management Technique: Backwards;With walker;Step to pattern Number of Stairs: 2 Wheelchair Mobility Wheelchair Mobility: No     PT Goals (current goals can now be found in the care plan section)    Visit Information  Last PT Received On: 02/07/13 Assistance Needed: +1 History of Present Illness: 79 yo female s/p L3-4 laminectomry and L3-4 L4-5 PLIF    Subjective Data  Subjective: "I feel better."   Cognition  Cognition Arousal/Alertness: Awake/alert Behavior During Therapy: WFL for tasks assessed/performed Overall Cognitive Status: Within Functional Limits for tasks assessed    Balance  Balance Balance Assessed: Yes Static Standing Balance Static Standing - Balance Support: Bilateral upper extremity supported;During functional activity Static Standing - Level of Assistance: 5: Stand by assistance Static Standing - Comment/# of Minutes: 2  End of Session PT - End of Session Equipment Utilized During Treatment: Back brace Activity Tolerance: Patient tolerated treatment well Patient left: in chair;with family/visitor present;with call bell/phone within reach Nurse Communication: Mobility status        INGOLD,Fisher Hargadon 02/07/2013, 2:43 PM  [facility] [phone] (pager)

## 2013-02-07 NOTE — Progress Notes (Signed)
Filed Vitals:   02/07/13 0218 02/07/13 0607 02/07/13 0941 02/07/13 0944  BP: 113/37 126/40 73/47 111/51  Pulse: 95 100 98   Temp: 97.9 F (36.6 C) 97.9 F (36.6 C) 98.8 F (37.1 C)   TempSrc: Oral Oral Oral   Resp: 18 18 18    Height:      Weight:      SpO2: 95% 94% 95%     CBC  Recent Labs  02/05/13 1550 02/06/13 0650  WBC 10.9* 8.9  HGB 8.7* 8.3*  HCT 25.6* 24.3*  PLT 141* 138*   BMET  Recent Labs  02/05/13 1247 02/06/13 0650  NA 141 138  K 3.9 3.6  CL  --  102  CO2  --  25  GLUCOSE 102* 123*  BUN  --  13  CREATININE  --  0.85  CALCIUM  --  8.1*    Patient sitting up in chair, and lumbar brace. Worked with physical therapy yesterday, awaiting the return today. Patient concerned by relative low blood pressure, and I discussed with her the typical contributing factors.  Plan: Continued to progress through postoperative recovery.  Hewitt Shorts, MD 02/07/2013, 10:24 AM

## 2013-02-07 NOTE — Progress Notes (Signed)
Occupational Therapy Treatment Patient Details Name: Michele [name] MRN: [id number] DOB: [birth year] Today's Date: 02/07/2013 Time: 7829-5621 OT Time Calculation (min): 36 min  OT Assessment / Plan / Recommendation  History of present illness 79 yo female s/p L3-4 laminectomry and L3-4 L4-5 PLIF   OT comments  Pt progressing well and demonstrates sink level grooming. ALl education complete at this time. Educated on car transfer, shower transfer and home seating arrangements  Follow Up Recommendations  Home health OT    Barriers to Discharge       Equipment Recommendations  3 in 1 bedside comode    Recommendations for Other Services    Frequency Min 2X/week   Progress towards OT Goals Progress towards OT goals: Progressing toward goals  Plan Discharge plan remains appropriate    Precautions / Restrictions Precautions Precautions: Back Precaution Booklet Issued: Yes (comment) Precaution Comments: back handout present recalling 2 out 3 precautions Required Braces or Orthoses: Spinal Brace Spinal Brace: Lumbar corset;Applied in sitting position Restrictions Weight Bearing Restrictions: No   Pertinent Vitals/Pain fatigue    ADL  Grooming: Wash/dry face;Teeth care;Supervision/safety Where Assessed - Grooming: Unsupported standing Toilet Transfer: Min Pension scheme manager Method: Sit to Barista: Raised toilet seat with arms (or 3-in-1 over toilet) Toileting - Clothing Manipulation and Hygiene: Min guard Where Assessed - Engineer, mining and Hygiene: Sit to stand from 3-in-1 or toilet Tub/Shower Transfer: Min guard Tub/Shower Transfer Method: Science writer: Walk in shower Equipment Used: Back brace;Rolling walker Transfers/Ambulation Related to ADLs: pt ambulating from bathroom supervision ADL Comments: pt brushing teeth at sink on arrival.Pt educated on using basin to prevent  (pt verbalized 3 out 3  precautions)    OT Diagnosis:    OT Problem List:   OT Treatment Interventions:     OT Goals(current goals can now be found in the care plan section) Acute Rehab OT Goals Patient Stated Goal: To be able to go home OT Goal Formulation: With patient Time For Goal Achievement: 02/20/13 Potential to Achieve Goals: Good ADL Goals Pt Will Transfer to Toilet: with modified independence;bedside commode Pt Will Perform Tub/Shower Transfer: Shower transfer;ambulating;with supervision Additional ADL Goal #1: Pt will verbalize  3 out 3 precautions   Visit Information  Last OT Received On: 02/07/13 Assistance Needed: +1 History of Present Illness: 79 yo female s/p L3-4 laminectomry and L3-4 L4-5 PLIF    Subjective Data      Prior Functioning       Cognition  Cognition Arousal/Alertness: Awake/alert Behavior During Therapy: WFL for tasks assessed/performed Overall Cognitive Status: Within Functional Limits for tasks assessed    Mobility  Bed Mobility Bed Mobility: Not assessed Transfers Sit to Stand: 4: Min guard;With upper extremity assist;From chair/3-in-1;With armrests Stand to Sit: 4: Min guard;With upper extremity assist;To chair/3-in-1;With armrests Details for Transfer Assistance: cues for hand placement and prevent pulling up on RW. Pt encouraged to reach for chair surface with hands to control descent.     Exercises      Balance Balance Balance Assessed: Yes Static Standing Balance Static Standing - Balance Support: Bilateral upper extremity supported;During functional activity Static Standing - Level of Assistance: 5: Stand by assistance Static Standing - Comment/# of Minutes: 2   End of Session OT - End of Session Activity Tolerance: Patient tolerated treatment well Patient left: in chair;with call bell/phone within reach;with family/visitor present Nurse Communication: Mobility status;Precautions  GO     [name] 02/07/2013, 3:43 PM Pager:  [phone]

## 2013-02-08 NOTE — Progress Notes (Signed)
Has had 2 episodes of nausea today. IV zofran given and effective. Still taking only tylenol for pain management and is doing very well with it. Has ambulated within the room and in the hallways today.

## 2013-02-08 NOTE — Progress Notes (Signed)
Patient ID: Michele [name], female   DOB: 11-19-1927, 79 y.o.   MRN: [id number] Subjective: Patient reports she is doing well. She has some back soreness. No leg pain and her left foot feels better.  Objective: Vital signs in last 24 hours: Temp:  [97.4 F (36.3 C)-99.4 F (37.4 C)] 98.4 F (36.9 C) (09/21 0930) Pulse Rate:  [90-108] 94 (09/21 0930) Resp:  [16-18] 18 (09/21 0930) BP: (81-122)/(26-43) 81/39 mmHg (09/21 0945) SpO2:  [97 %-100 %] 100 % (09/21 0930)  Intake/Output from previous day: 09/20 0701 - 09/21 0700 In: 120 [P.O.:120] Out: -  Intake/Output this shift:    Neurologic: Grossly normal  Lab Results: Lab Results  Component Value Date   WBC 8.9 02/06/2013   HGB 8.3* 02/06/2013   HCT 24.3* 02/06/2013   MCV 89.0 02/06/2013   PLT 138* 02/06/2013   No results found for this basename: INR, PROTIME   BMET Lab Results  Component Value Date   NA 138 02/06/2013   K 3.6 02/06/2013   CL 102 02/06/2013   CO2 25 02/06/2013   GLUCOSE 123* 02/06/2013   BUN 13 02/06/2013   CREATININE 0.85 02/06/2013   CALCIUM 8.1* 02/06/2013    Studies/Results: No results found.  Assessment/Plan: Doing well. Continue current management   LOS: 3 days    Petra Dumler S 02/08/2013, 11:47 AM

## 2013-02-08 NOTE — Progress Notes (Signed)
Physical Therapy Treatment Patient Details Name: Michele [name] MRN: [id number] DOB: [birth year] Today's Date: 02/08/2013 Time: 7829-5621 PT Time Calculation (min): 30 min  PT Assessment / Plan / Recommendation  History of Present Illness 79 yo female s/p L3-4 laminectomry and L3-4 L4-5 PLIF   PT Comments   Continuing progress with mobility, including managing 2 steps to enter home, no rails, with family assist; On track for dc home tomorrow  Follow Up Recommendations  Home health PT;Supervision/Assistance - 24 hour     Does the patient have the potential to tolerate intense rehabilitation     Barriers to Discharge        Equipment Recommendations  Rolling walker with 5" wheels;3in1 (PT)    Recommendations for Other Services    Frequency Min 6X/week   Progress towards PT Goals Progress towards PT goals: Progressing toward goals  Plan Current plan remains appropriate    Precautions / Restrictions Precautions Precautions: Back Precaution Booklet Issued: Yes (comment) Precaution Comments: recalled 3/3 prec Required Braces or Orthoses: Spinal Brace Spinal Brace: Lumbar corset;Applied in sitting position Restrictions Weight Bearing Restrictions: No   Pertinent Vitals/Pain no apparent distress     Mobility  Bed Mobility Bed Mobility: Not assessed Transfers Transfers: Sit to Stand;Stand to Sit Sit to Stand: 4: Min guard;With upper extremity assist;From chair/3-in-1;With armrests Stand to Sit: 4: Min guard;With upper extremity assist;To chair/3-in-1;With armrests Details for Transfer Assistance: Simulated sitting to and standing from a desk or dining room table by doing so infront of "desk " area in room Ambulation/Gait Ambulation/Gait Assistance: 4: Min guard Assistive device: Rolling walker Ambulation/Gait Assistance Details: Coninued safe amb, though slow Gait Pattern: Step-through pattern;Decreased stride length;Trunk flexed Gait velocity: decreased Stairs:  Yes Stairs Assistance: 4: Min Editor, commissioning Details (indicate cue type and reason): Good practice up and down steps backwards with help from husband and daughter Stair Management Technique: Backwards;With walker;Step to pattern Wheelchair Mobility Wheelchair Mobility: No    Exercises     PT Diagnosis:    PT Problem List:   PT Treatment Interventions:     PT Goals (current goals can now be found in the care plan section) Acute Rehab PT Goals Patient Stated Goal: To be able to go home Time For Goal Achievement: 02/13/13 Potential to Achieve Goals: Good  Visit Information  Last PT Received On: 02/08/13 Assistance Needed: +1 History of Present Illness: 79 yo female s/p L3-4 laminectomry and L3-4 L4-5 PLIF    Subjective Data  Subjective: "I feel better." Patient Stated Goal: To be able to go home   Cognition  Cognition Arousal/Alertness: Awake/alert Behavior During Therapy: WFL for tasks assessed/performed Overall Cognitive Status: Within Functional Limits for tasks assessed    Balance  Balance Balance Assessed: Yes Static Standing Balance Static Standing - Balance Support: Bilateral upper extremity supported;During functional activity Static Standing - Level of Assistance: 5: Stand by assistance  End of Session PT - End of Session Equipment Utilized During Treatment: Back brace Activity Tolerance: Patient tolerated treatment well Patient left: with family/visitor present;Other (comment) (walking hallway with family) Nurse Communication: Mobility status   GP     [facility] [place], [place] [phone]  02/08/2013, 3:58 PM

## 2013-02-08 NOTE — Progress Notes (Signed)
PT Cancellation Note  Patient Details Name: Michele [name] MRN: [id number] DOB: [birth year]   Cancelled Treatment:    Reason Eval/Treat Not Completed: Medical issues which prohibited therapy--pt nauseous, has been sitting up since 6:30 a.m. And wants to lie down prior to trying steps again with husband's assist.   [name] 02/08/2013, 11:26 AM Pager [phone]

## 2013-02-09 MED ORDER — DSS 100 MG PO CAPS: 100.0000 mg | ORAL_CAPSULE | Freq: Two times a day (BID) | ORAL | Status: DC

## 2013-02-09 NOTE — Progress Notes (Signed)
Occupational Therapy Treatment Patient Details Name: Michele [name] MRN: [id number] DOB: 1928-02-27 Today's Date: 02/09/2013 Time: 7829-5621 OT Time Calculation (min): 12 min  OT Assessment / Plan / Recommendation  History of present illness 79 yo female s/p L3-4 laminectomry and L3-4 L4-5 PLIF   OT comments  Patient provided treatment by Occupational Therapy with no further acute OT needs identified. All education has been completed and the patient has no further questions. See below for any follow-up Occupational Therapy or equipment needs. OT to sign off. Thank you for referral.    Follow Up Recommendations  Home health OT    Barriers to Discharge       Equipment Recommendations  3 in 1 bedside comode    Recommendations for Other Services    Frequency Min 2X/week   Progress towards OT Goals Progress towards OT goals: Goals met/education completed, patient discharged from OT  Plan Discharge plan remains appropriate    Precautions / Restrictions Precautions Precautions: Back Precaution Comments: reviewed back precautions handout; pt able to recall 3/3 back precautions; min cues during session to adhere  Required Braces or Orthoses: Spinal Brace Spinal Brace: Lumbar corset;Applied in sitting position Restrictions Weight Bearing Restrictions: No   Pertinent Vitals/Pain No pain reported    ADL  ADL Comments: Ot session focused on pending d/c today: Pt and family given demonstration of 3n1 break down to fit in trunk of Michele [name]. Michele [name] notified that DME is not present at this time and phone call made to gain equipment. Pt and family educated on car transfer. Pt and family verbalized transfer into the house sequence.  Pt currently able to verbalize back precautions and proper don/doff brace. Pt with tank top brace and gown over brace currently.   OT Diagnosis:    OT Problem List:   OT Treatment Interventions:     OT Goals(current goals can now be found in the care  plan section) Acute Rehab OT Goals Patient Stated Goal: home today OT Goal Formulation: With patient Time For Goal Achievement: 02/20/13 Potential to Achieve Goals: Good ADL Goals Pt Will Transfer to Toilet: with modified independence;bedside commode Pt Will Perform Tub/Shower Transfer: Shower transfer;ambulating;with supervision Additional ADL Goal #1: Pt will verbalize  3 out 3 precautions   Visit Information  Last OT Received On: 02/09/13 Assistance Needed: +1 History of Present Illness: 79 yo female s/p L3-4 laminectomry and L3-4 L4-5 PLIF    Subjective Data      Prior Functioning       Cognition  Cognition Arousal/Alertness: Awake/alert Behavior During Therapy: WFL for tasks assessed/performed Overall Cognitive Status: Within Functional Limits for tasks assessed    Mobility  Bed Mobility Bed Mobility: Not assessed Details for Bed Mobility Assistance: pt sitting in chair and returned to chair; discussed bed mobility and log rolling technique with pt; pt able to verbalize understanding  Transfers Transfers: Not assessed Sit to Stand: 5: Supervision;From chair/3-in-1;With armrests Stand to Sit: 5: Supervision;To chair/3-in-1;With armrests Details for Transfer Assistance: pt relies on armrests; no physical (A) needed; supervision for safety; pt demo good ability to perform transfers and maintain back precautions     Exercises      Balance Balance Balance Assessed: Yes Static Standing Balance Static Standing - Balance Support: No upper extremity supported Static Standing - Level of Assistance: 5: Stand by assistance Static Standing - Comment/# of Minutes: pt performing ADLs at sink without UE support; was using the counter for bracing; stand by (A) for safety  End of Session OT - End of Session Activity Tolerance: Patient tolerated treatment well Patient left: in chair;with call bell/phone within reach;with family/visitor present Nurse Communication: Mobility  status  GO     [name] 02/09/2013, 10:45 AM Pager: [phone]

## 2013-02-09 NOTE — Progress Notes (Signed)
Physical Therapy Treatment Patient Details Name: Michele [name] MRN: [id number] DOB: 03/26/1928 Today's Date: 02/09/2013 Time: 1191-4782 PT Time Calculation (min): 26 min  PT Assessment / Plan / Recommendation  History of Present Illness 79 yo female s/p L3-4 laminectomry and L3-4 L4-5 PLIF   PT Comments   Pt is moving well. Pt is at supervision level for mobility for safety. All education complete in acute setting at this time. Pt educated on proper technique for car transfer. Pt would benefit from HHPT to ensure safe transition home.   Follow Up Recommendations  Home health PT;Supervision/Assistance - 24 hour     Does the patient have the potential to tolerate intense rehabilitation     Barriers to Discharge        Equipment Recommendations  Rolling walker with 5" wheels;3in1 (PT)    Recommendations for Other Services    Frequency Min 6X/week   Progress towards PT Goals Progress towards PT goals: Progressing toward goals  Plan Current plan remains appropriate    Precautions / Restrictions Precautions Precautions: Back Precaution Comments: reviewed back precautions handout; pt able to recall 3/3 back precautions; min cues during session to adhere  Required Braces or Orthoses: Spinal Brace Spinal Brace: Lumbar corset;Applied in sitting position Restrictions Weight Bearing Restrictions: No   Pertinent Vitals/Pain "i dont have any pain"    Mobility  Bed Mobility Bed Mobility: Not assessed Details for Bed Mobility Assistance: pt sitting in chair and returned to chair; discussed bed mobility and log rolling technique with pt; pt able to verbalize understanding  Transfers Transfers: Sit to Stand;Stand to Sit Sit to Stand: 5: Supervision;From chair/3-in-1;With armrests Stand to Sit: 5: Supervision;To chair/3-in-1;With armrests Details for Transfer Assistance: pt relies on armrests; no physical (A) needed; supervision for safety; pt demo good ability to perform transfers and  maintain back precautions  Ambulation/Gait Ambulation/Gait Assistance: 5: Supervision Ambulation Distance (Feet): 300 Feet Assistive device: Rolling walker Ambulation/Gait Assistance Details: vc's to look up with amb; demo good technique but slow with RW  Gait Pattern: Step-through pattern;Decreased stride length;Trunk flexed Gait velocity: decreased Stairs: Yes Stairs Assistance: 4: Min Editor, commissioning Details (indicate cue type and reason): pt demo good technique and recall of safest technique without handrails; min (A) to manage RW; given handout for proper stair amb to reinforce safest technique  Stair Management Technique: Backwards;With walker;Step to pattern Number of Stairs: 2 Wheelchair Mobility Wheelchair Mobility: No         PT Diagnosis:    PT Problem List:   PT Treatment Interventions:     PT Goals (current goals can now be found in the care plan section) Acute Rehab PT Goals Patient Stated Goal: home today PT Goal Formulation: With patient/family Time For Goal Achievement: 02/13/13 Potential to Achieve Goals: Good  Visit Information  Last PT Received On: 02/09/13 Assistance Needed: +1 History of Present Illness: 79 yo female s/p L3-4 laminectomry and L3-4 L4-5 PLIF    Subjective Data  Subjective: "im going home. Can we walk to the bathroom before we go out in the hall? "  Patient Stated Goal: home today   Cognition  Cognition Arousal/Alertness: Awake/alert Behavior During Therapy: WFL for tasks assessed/performed Overall Cognitive Status: Within Functional Limits for tasks assessed    Balance  Balance Balance Assessed: Yes Static Standing Balance Static Standing - Balance Support: No upper extremity supported Static Standing - Level of Assistance: 5: Stand by assistance Static Standing - Comment/# of Minutes: pt performing ADLs at sink without UE  support; was using the counter for bracing; stand by (A) for safety   End of Session PT - End of  Session Equipment Utilized During Treatment: Back brace Activity Tolerance: Patient tolerated treatment well Patient left: in chair;with call bell/phone within reach Nurse Communication: Mobility status   GP     Donell Sievert, [place] [phone] 02/09/2013, 8:41 AM

## 2013-02-09 NOTE — Discharge Summary (Signed)
Physician Discharge Summary  Patient ID: Michele [name] MRN: [id number] DOB/AGE: 07-03-1927 79 y.o.  Admit date: 02/05/2013 Discharge date: 02/09/2013  Admission Diagnoses: L3-4 and L4-5 spondylolisthesis, spinal stenosis, lumbago, lumbar radiculopathy, neurogenic claudication  Discharge Diagnoses: The same Active Problems:   * No active hospital problems. *   Discharged Condition: good  Hospital Course: I performed an L3-4 and L4-5 decompression, instrumentation, and fusion on the patient on 02/05/2013. The patient's postoperative course was unremarkable.  On postop day #4, i.e. 02/09/2013, the patient requested discharge to home. The patient declined discharge prescriptions and wants to take Tylenol at home. The patient was given oral and written discharge instructions. All her questions were answered. Arrangements were made for home PT and OT.  Consults: PT, OT Significant Diagnostic Studies: None Treatments: L3-4 and L4-5 decompression, instrumentation, and fusion. Discharge Exam: Blood pressure 137/87, pulse 69, temperature 98.6 F (37 C), temperature source Oral, resp. rate 18, height 5\' 3"  (1.6 m), weight 64.864 kg (143 lb), SpO2 97.00%. Patient is alert and pleasant. She looks well. Her wound is healing well without signs of infection or discharge. Her strength is normal.  Disposition: Home  Discharge Orders   Future Orders Complete By Expires   Call MD for:  difficulty breathing, headache or visual disturbances  As directed    Call MD for:  extreme fatigue  As directed    Call MD for:  hives  As directed    Call MD for:  persistant dizziness or light-headedness  As directed    Call MD for:  persistant nausea and vomiting  As directed    Call MD for:  redness, tenderness, or signs of infection (pain, swelling, redness, odor or green/yellow discharge around incision site)  As directed    Call MD for:  severe uncontrolled pain  As directed    Call MD for:  temperature  >100.4  As directed    Diet - low sodium heart healthy  As directed    Discharge instructions  As directed    Comments:     Call [phone] for a followup appointment. Take a stool softener while you are using pain medications.   Driving Restrictions  As directed    Comments:     Do not drive for 2 weeks.   Increase activity slowly  As directed    Lifting restrictions  As directed    Comments:     Do not lift more than 5 pounds. No excessive bending or twisting.   May shower / Bathe  As directed    Comments:     He may shower after the pain she is removed 3 days after surgery. Leave the incision alone.   No dressing needed  As directed        Medication List         calcium-vitamin D 500-200 MG-UNIT per tablet  Commonly known as:  OSCAL WITH D  Take 1 tablet by mouth every morning.     clopidogrel 75 MG tablet  Commonly known as:  PLAVIX  Take 75 mg by mouth at bedtime.     DSS 100 MG Caps  Take 100 mg by mouth 2 (two) times daily.     ICAPS Tabs  Take 1 tablet by mouth every morning.     Krill Oil 300 MG Caps  Take 300 mg by mouth every morning.     levothyroxine 112 MCG tablet  Commonly known as:  SYNTHROID, LEVOTHROID  Take 112 mcg by mouth  at bedtime.     lisinopril 5 MG tablet  Commonly known as:  PRINIVIL,ZESTRIL  Take 5 mg by mouth at bedtime.     potassium chloride SA 20 MEQ tablet  Commonly known as:  K-DUR,KLOR-CON  Take 20 mEq by mouth every morning.     psyllium 58.6 % powder  Commonly known as:  METAMUCIL  Take 1 packet by mouth 2 (two) times daily.     rosuvastatin 5 MG tablet  Commonly known as:  CRESTOR  Take 5 mg by mouth at bedtime.     tolterodine 4 MG 24 hr capsule  Commonly known as:  DETROL LA  Take 4 mg by mouth at bedtime.         SignedCristi Loron 02/09/2013, 7:39 AM

## 2013-02-09 NOTE — Care Management Note (Signed)
    Page 1 of 2   02/09/2013     2:58:36 PM   CARE MANAGEMENT NOTE 02/09/2013  Patient:  Michele [name], Michele [name]   Account Number:  [id number]  Date Initiated:  02/09/2013  Documentation initiated by:  Elmer Bales  Subjective/Objective Assessment:   patient admitted for lumbar surgery     Action/Plan:   Will follow for discharge needs   Anticipated DC Date:  02/09/2013   Anticipated DC Plan:  HOME W HOME HEALTH SERVICES      DC Planning Services  CM consult      Choice offered to / List presented to:  C-1 Patient   DME arranged  3-N-1  [facility]      DME agency  [facility]     HH arranged  HH-2 PT  HH-3 OT      [facility] agency  [facility]   Status of service:  Completed, signed off Medicare Important Message given?   (If response is "NO", the following Medicare IM given date fields will be blank) Date Medicare IM given:   Date Additional Medicare IM given:    Discharge Disposition:  HOME W HOME HEALTH SERVICES  Per UR Regulation:  Reviewed for med. necessity/level of care/duration of stay  If discussed at Long Length of Stay Meetings, dates discussed:    Comments:  02/09/13 1100 Elmer Bales RN, MSN, CM- Met with patient and family to discuss home health. Patient and family have chosen [facility] for PT/OT. Mary with [facility] was notified and has accepted the referral. [facility] DME was notified of need for rolling walker and 3N1 for discharge home today.

## 2013-02-09 NOTE — Progress Notes (Signed)
Discharge orders received, vitals stable. Lumbar fusion education reviewed with patient and family, care of incison, medications. Discharged home with family via wheelchair.

## 2013-02-12 ENCOUNTER — Ambulatory Visit: Payer: Self-pay | Admitting: Neurosurgery

## 2013-02-12 IMAGING — US US EXTREM LOW VENOUS BILAT
1 series · 14 of 24 positions shown · non-contrast
Comparison: none

REASON FOR EXAM: CALL REPORT [DATE] Bilateral pain and swelling Eval
for DVT Post surg [H4]
COMMENTS:

[Series 1: us extrem low venous bilat · 0.08mm/px · 14 of 62 slices shown]
[im 1/62]
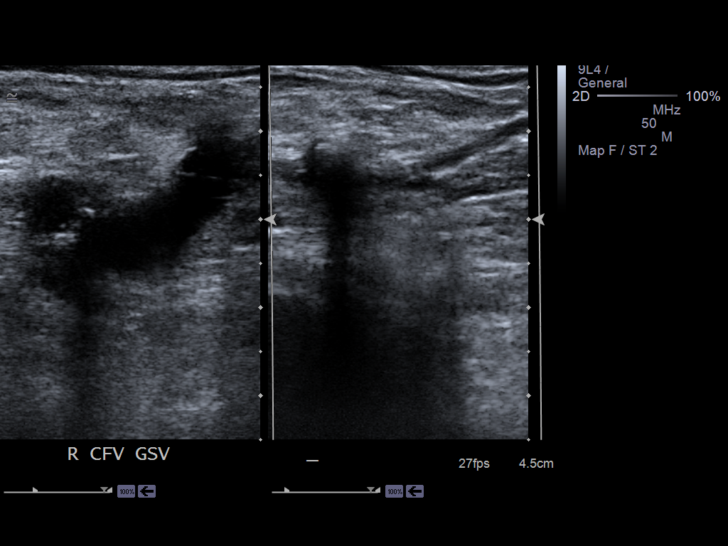
[im 6/62]
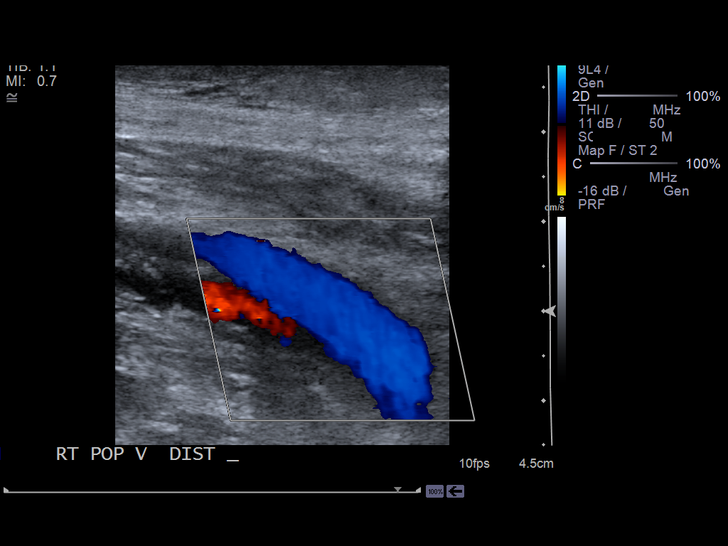
[im 11/62]
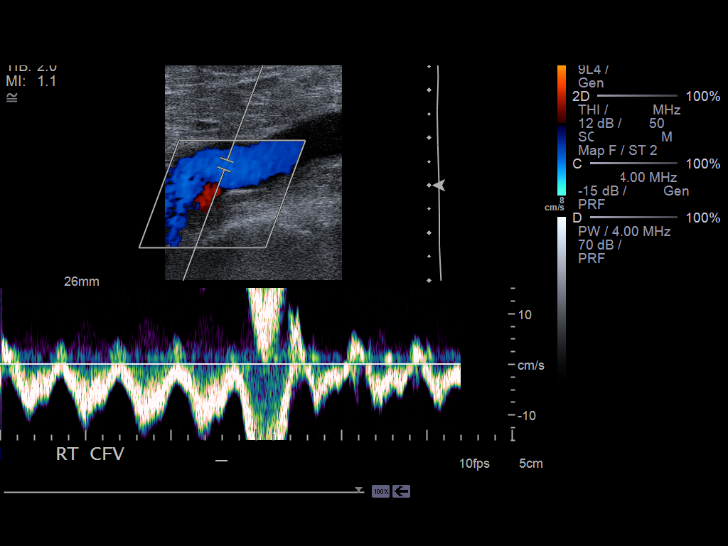
[im 16/62]
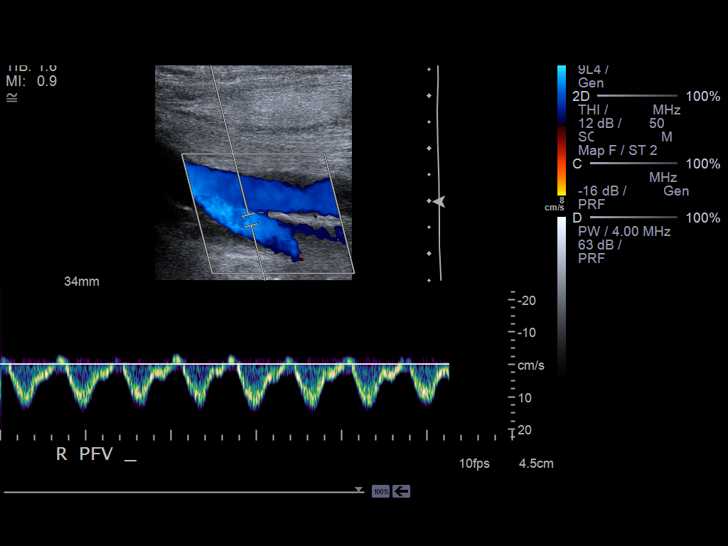
[im 19/62]
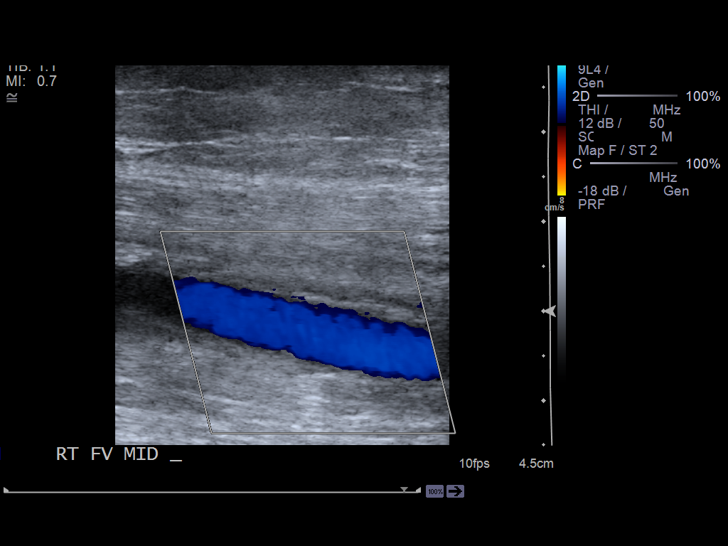
[im 24/62]
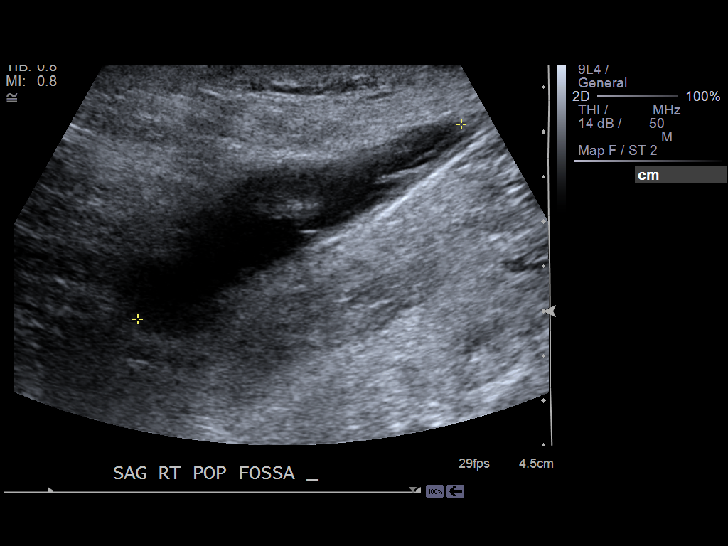
[im 30/62]
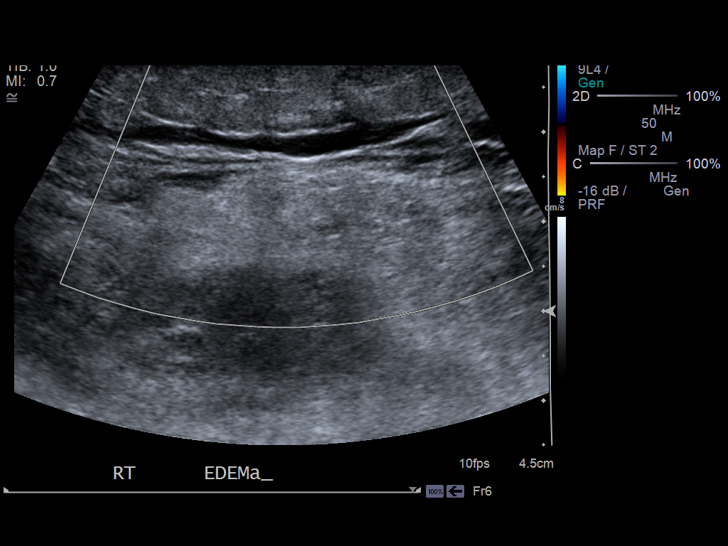
[im 32/62]
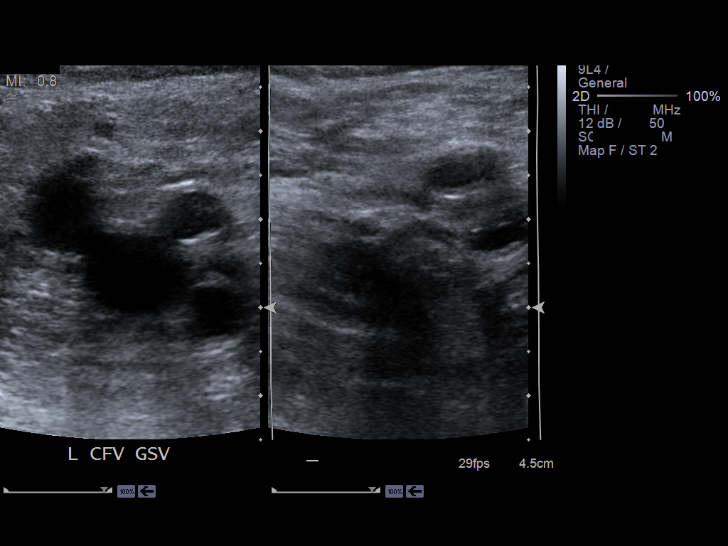
[im 38/62]
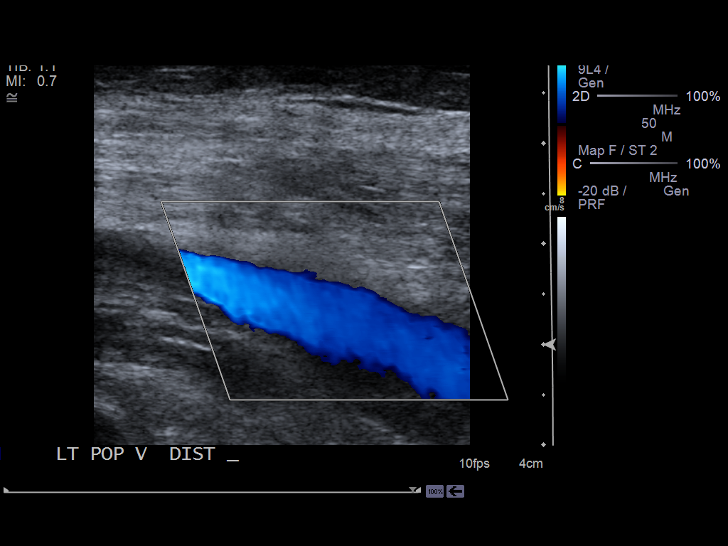
[im 43/62]
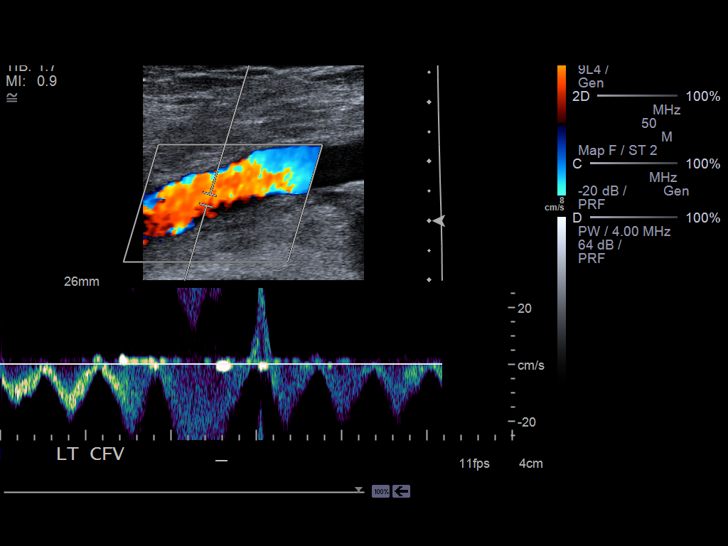
[im 48/62]
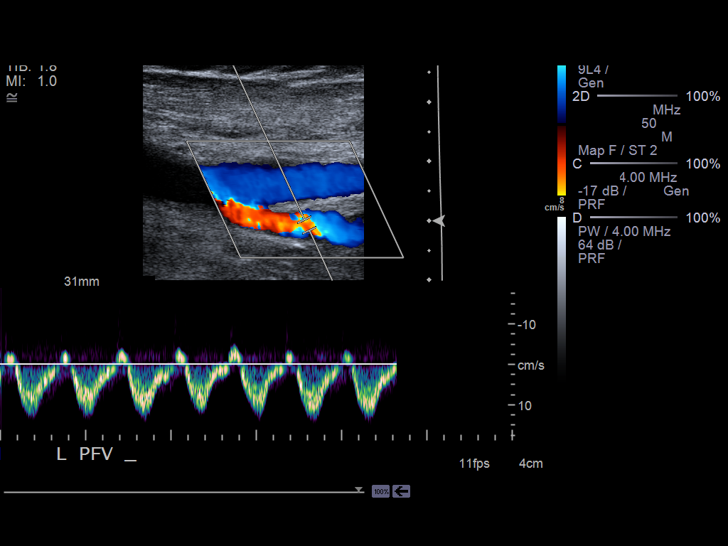
[im 51/62]
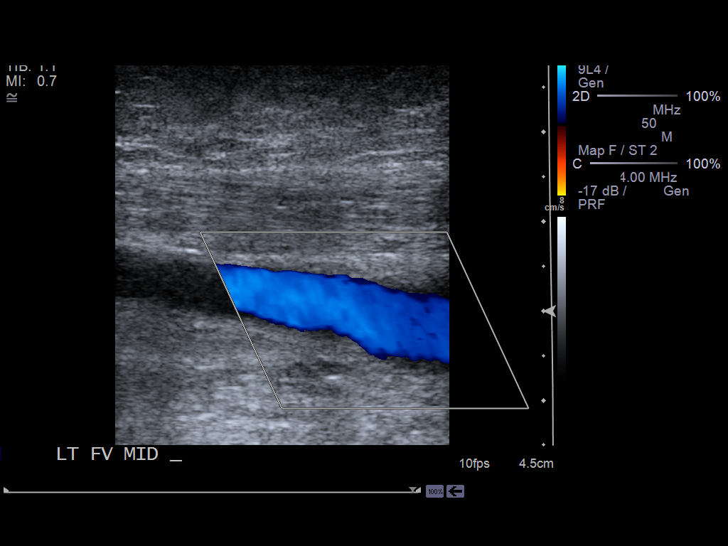
[im 56/62]
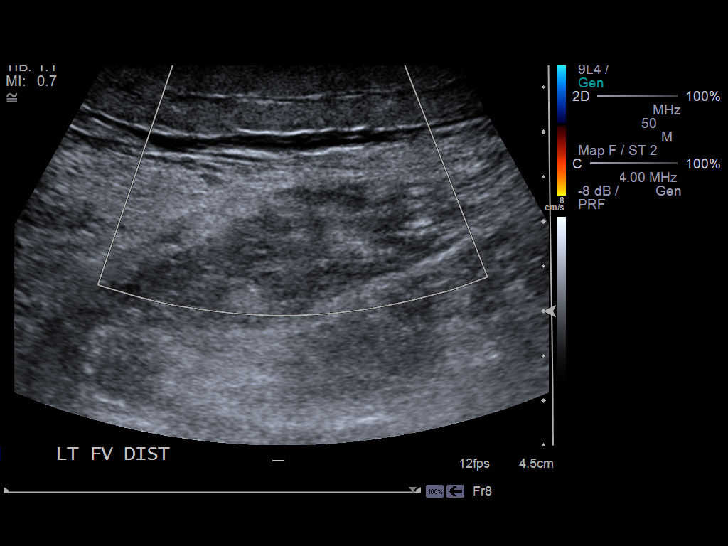
[im 62/62]
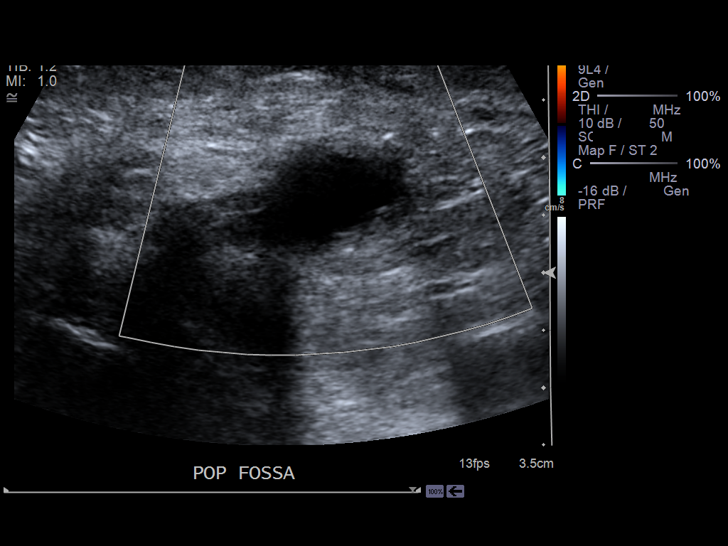

[14 of 24 positions shown; findings below may reference images not displayed]

PROCEDURE:     US  - US DOPPLER LOW EXTR BILATERAL  - [DATE]  [DATE]

RESULT:     Duplex Doppler interrogation of the deep venous system of both
legs from the inguinal to the popliteal region demonstrates the deep venous
systems are fully compressible throughout. The color Doppler and spectral
Doppler appearance is normal. There is normal response to distal
augmentation. The color Doppler images show no filling defect.

Images demonstrate bilateral popliteal fossa fluid collections consistent
with Baker's cyst. On the right side this measures 4.2 to by 2.54 x 1.18 cm.
The left popliteal fossa fluid collection measures 3.40 x 1.61 x 1.05 cm.
IMPRESSION: 1. No evidence of DVT in either lower extremity.
2. Bilateral popliteal fossa cysts.

[REDACTED]

## 2013-03-04 ENCOUNTER — Encounter: Payer: Self-pay | Admitting: Neurosurgery

## 2013-03-21 ENCOUNTER — Encounter: Payer: Self-pay | Admitting: Neurosurgery

## 2013-07-29 ENCOUNTER — Ambulatory Visit: Payer: Self-pay | Admitting: Obstetrics and Gynecology

## 2013-12-02 ENCOUNTER — Ambulatory Visit: Payer: Self-pay | Admitting: Ophthalmology

## 2014-01-20 ENCOUNTER — Ambulatory Visit: Payer: Self-pay | Admitting: Ophthalmology

## 2014-06-01 DIAGNOSIS — I10 Essential (primary) hypertension: Secondary | ICD-10-CM | POA: Diagnosis not present

## 2014-06-01 DIAGNOSIS — Z6825 Body mass index (BMI) 25.0-25.9, adult: Secondary | ICD-10-CM | POA: Diagnosis not present

## 2014-06-01 DIAGNOSIS — M545 Low back pain: Secondary | ICD-10-CM | POA: Diagnosis not present

## 2014-06-03 DIAGNOSIS — Z79899 Other long term (current) drug therapy: Secondary | ICD-10-CM | POA: Diagnosis not present

## 2014-06-03 DIAGNOSIS — E039 Hypothyroidism, unspecified: Secondary | ICD-10-CM | POA: Diagnosis not present

## 2014-06-03 DIAGNOSIS — E785 Hyperlipidemia, unspecified: Secondary | ICD-10-CM | POA: Diagnosis not present

## 2014-06-07 DIAGNOSIS — H3531 Nonexudative age-related macular degeneration: Secondary | ICD-10-CM | POA: Diagnosis not present

## 2014-06-10 DIAGNOSIS — Z79899 Other long term (current) drug therapy: Secondary | ICD-10-CM | POA: Diagnosis not present

## 2014-06-10 DIAGNOSIS — E039 Hypothyroidism, unspecified: Secondary | ICD-10-CM | POA: Diagnosis not present

## 2014-06-10 DIAGNOSIS — E780 Pure hypercholesterolemia: Secondary | ICD-10-CM | POA: Diagnosis not present

## 2014-06-10 DIAGNOSIS — Z0000 Encounter for general adult medical examination without abnormal findings: Secondary | ICD-10-CM | POA: Diagnosis not present

## 2014-07-29 DIAGNOSIS — H3532 Exudative age-related macular degeneration: Secondary | ICD-10-CM | POA: Diagnosis not present

## 2014-07-29 DIAGNOSIS — Z961 Presence of intraocular lens: Secondary | ICD-10-CM | POA: Diagnosis not present

## 2014-08-10 ENCOUNTER — Ambulatory Visit: Payer: Self-pay | Admitting: Family Medicine

## 2014-08-10 DIAGNOSIS — Z1231 Encounter for screening mammogram for malignant neoplasm of breast: Secondary | ICD-10-CM | POA: Diagnosis not present

## 2014-12-09 ENCOUNTER — Ambulatory Visit: Payer: Commercial Managed Care - HMO

## 2014-12-09 ENCOUNTER — Ambulatory Visit
Admission: EM | Admit: 2014-12-09 | Discharge: 2014-12-09 | Disposition: A | Discharge status: Home / Self Care | Payer: Commercial Managed Care - HMO | Attending: Internal Medicine | Admitting: Internal Medicine

## 2014-12-09 DIAGNOSIS — M7150 Other bursitis, not elsewhere classified, unspecified site: Secondary | ICD-10-CM | POA: Diagnosis not present

## 2014-12-09 DIAGNOSIS — E039 Hypothyroidism, unspecified: Secondary | ICD-10-CM | POA: Diagnosis not present

## 2014-12-09 DIAGNOSIS — S8392XA Sprain of unspecified site of left knee, initial encounter: Secondary | ICD-10-CM | POA: Diagnosis not present

## 2014-12-09 DIAGNOSIS — M7050 Other bursitis of knee, unspecified knee: Secondary | ICD-10-CM

## 2014-12-09 DIAGNOSIS — M71562 Other bursitis, not elsewhere classified, left knee: Secondary | ICD-10-CM | POA: Diagnosis not present

## 2014-12-09 DIAGNOSIS — E780 Pure hypercholesterolemia: Secondary | ICD-10-CM | POA: Diagnosis not present

## 2014-12-09 DIAGNOSIS — Z79899 Other long term (current) drug therapy: Secondary | ICD-10-CM | POA: Diagnosis not present

## 2014-12-09 DIAGNOSIS — M25562 Pain in left knee: Secondary | ICD-10-CM | POA: Diagnosis present

## 2014-12-09 IMAGING — CR DG KNEE COMPLETE 4+V*L*
4 series · 4 of 4 positions shown · non-contrast
Comparison: None.

CLINICAL DATA: Patient twisted knee while gardening. Pain anterior
and lateral in location

EXAM:
LEFT KNEE - COMPLETE 4+ VIEW

[knee ap (1 of 3)]
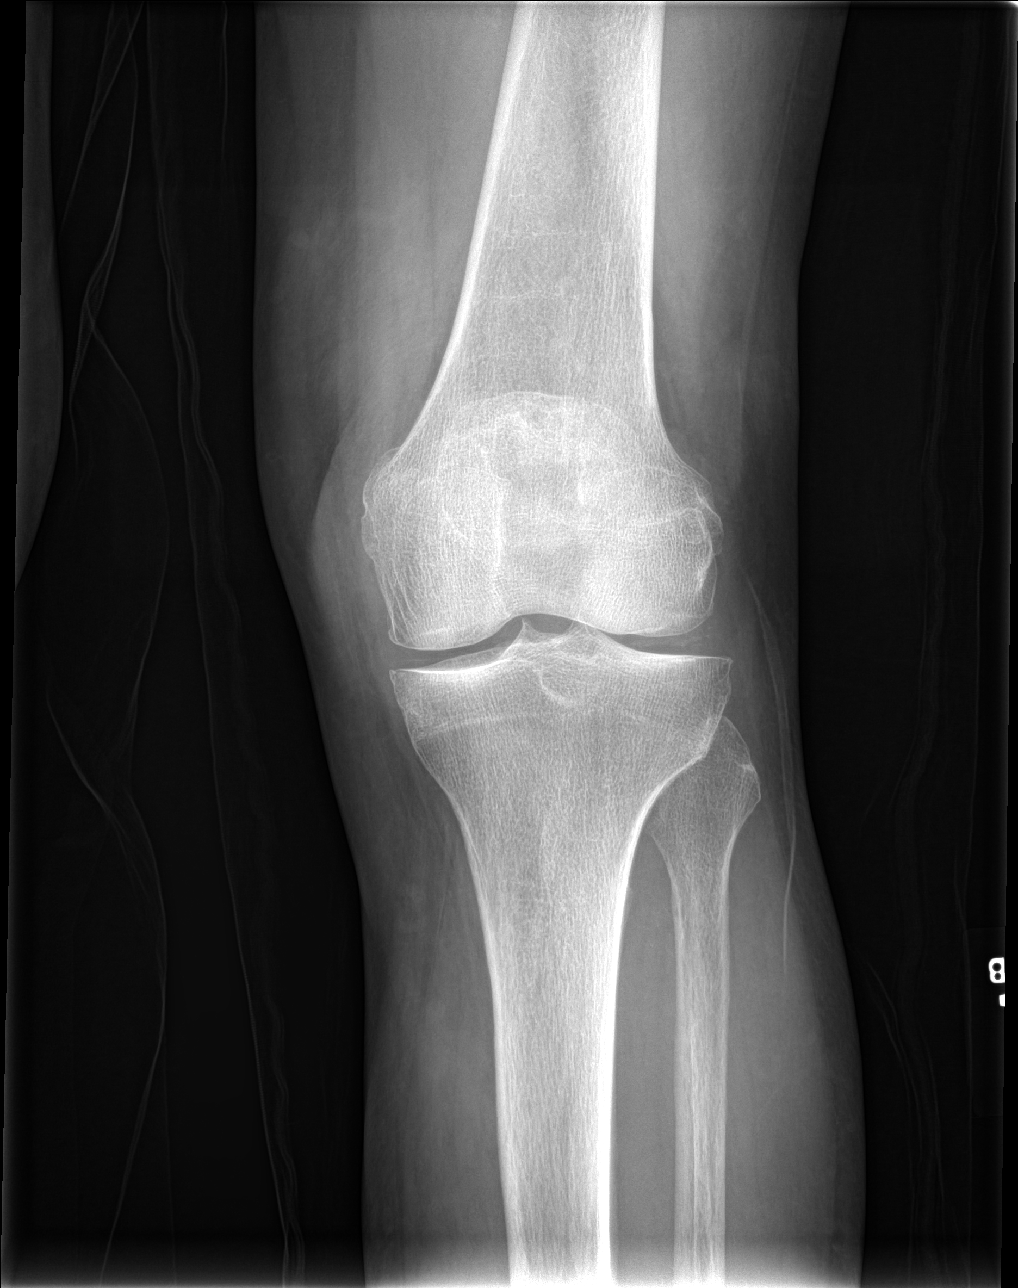

[knee lat]
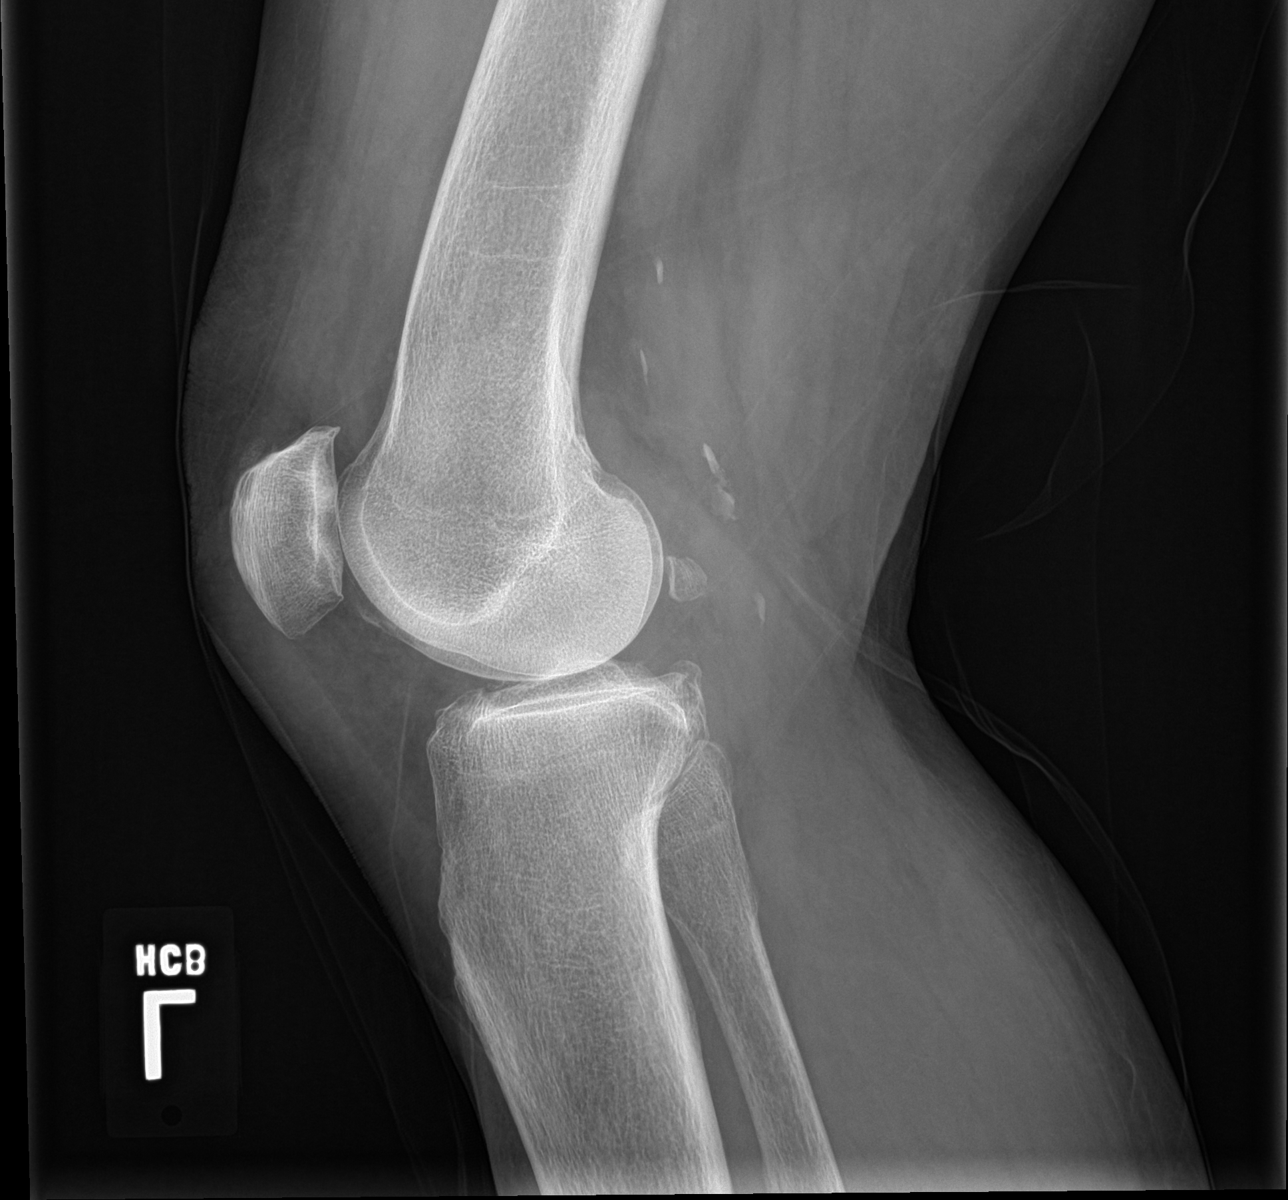

[knee ap (2 of 3)]
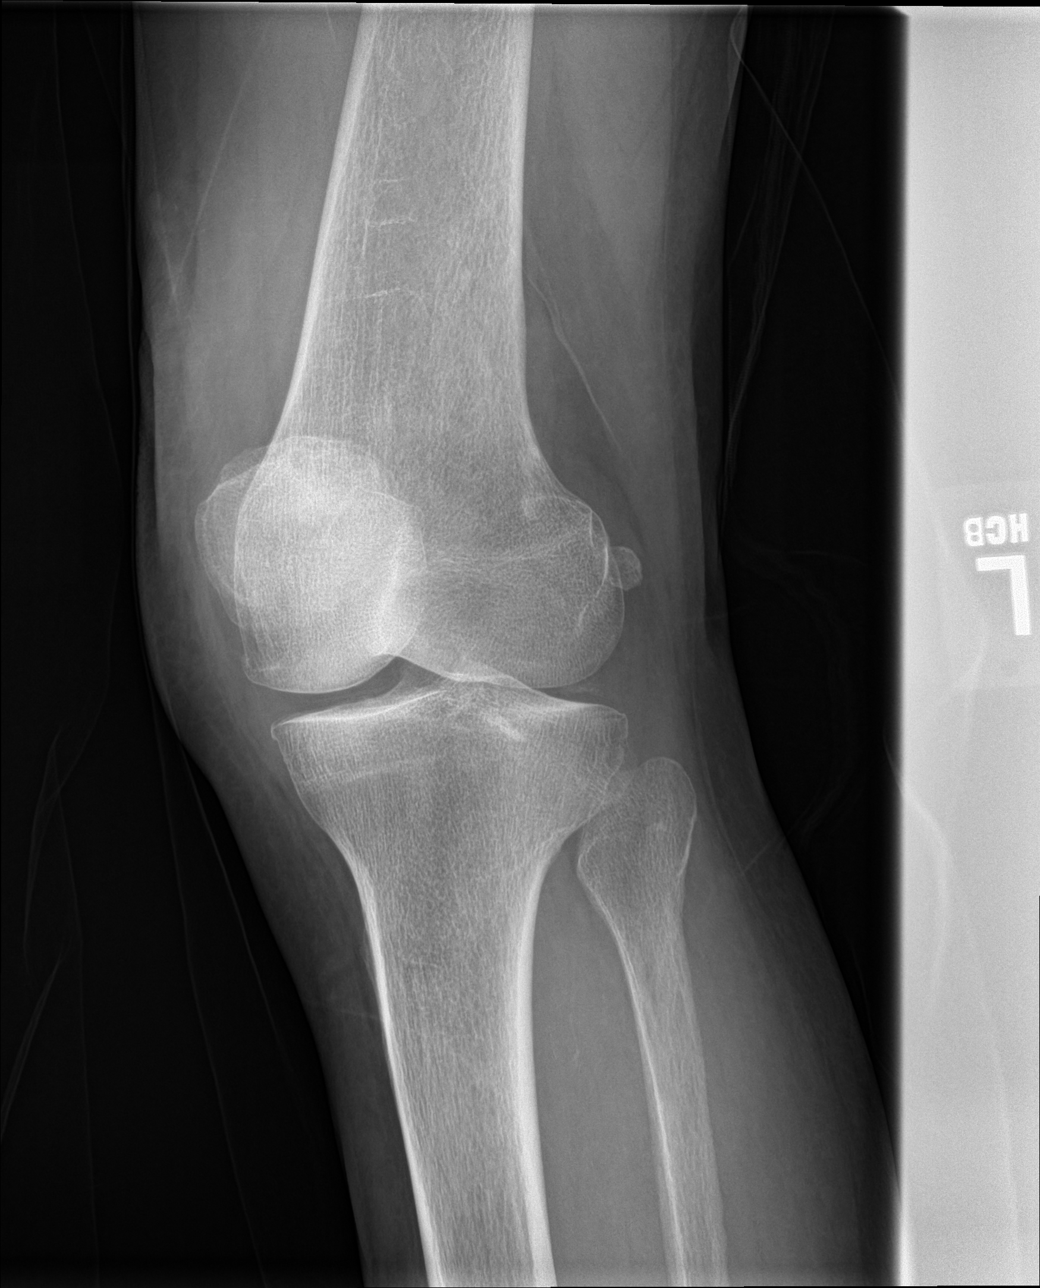

[knee ap (3 of 3)]
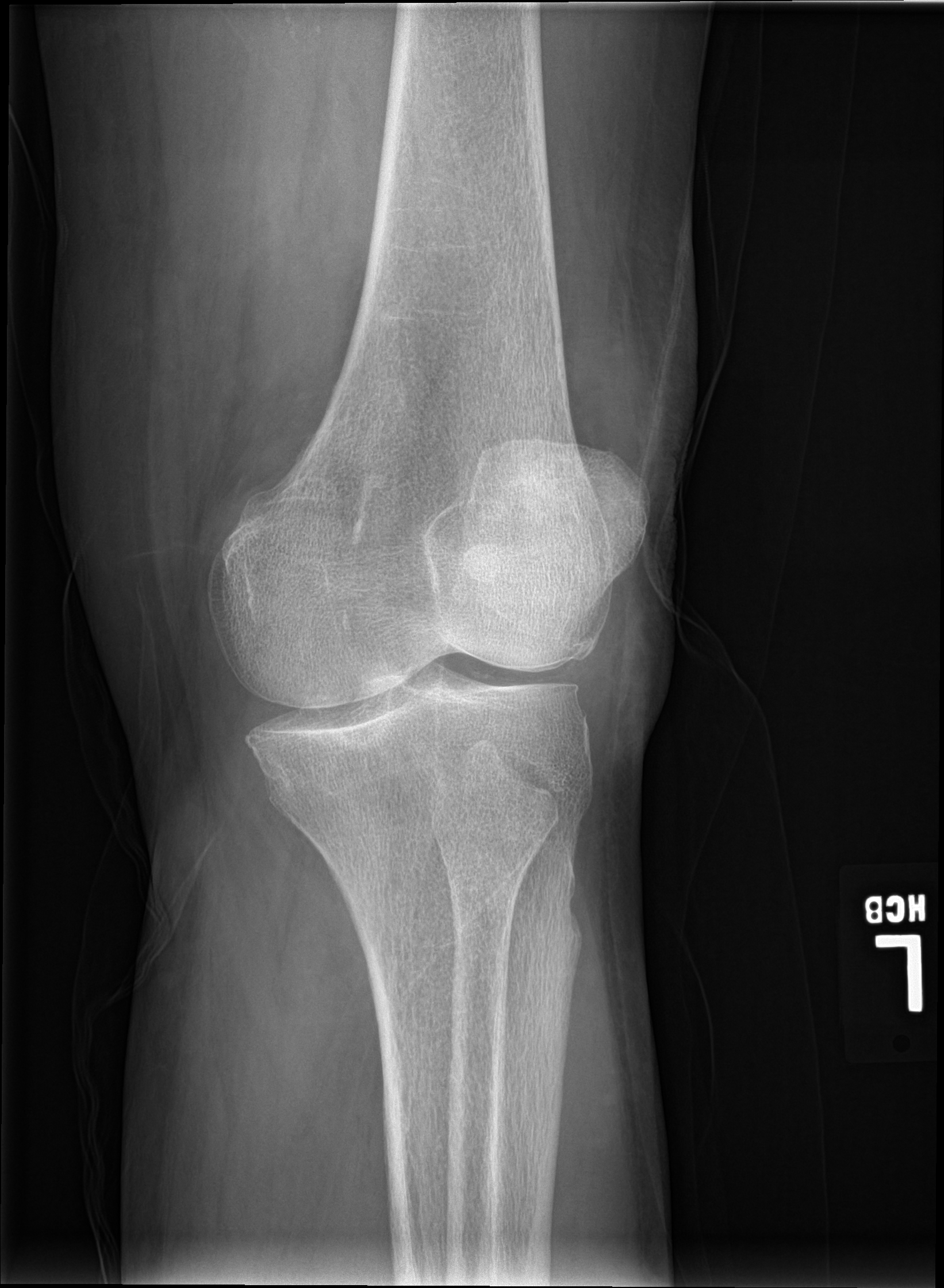

[4 of 4 positions shown; findings below may reference images not displayed]

FINDINGS: Frontal, bilateral oblique, and lateral views obtained. There is no
fracture or dislocation. No appreciable joint effusion. There is
slight generalized joint space narrowing. No erosive change. There
are foci of meniscal calcification medially and laterally. There are
foci of arterial vascular calcification.
IMPRESSION: No fracture or joint effusion. Mild generalized joint space
narrowing. Chondrocalcinosis. Chondrocalcinosis may be seen with
osteoarthritis but also may be seen with calcium pyrophosphate
deposition disease. No erosions.

## 2014-12-09 MED ORDER — MELOXICAM 7.5 MG PO TABS: 7.5000 mg | ORAL_TABLET | Freq: Every day | ORAL | Status: DC

## 2014-12-09 NOTE — Discharge Instructions (Signed)
Bursitis °Bursitis is a swelling and soreness (inflammation) of a fluid-filled sac (bursa) that overlies and protects a joint. It can be caused by injury, overuse of the joint, arthritis or infection. The joints most likely to be affected are the elbows, shoulders, hips and knees. °HOME CARE INSTRUCTIONS  °· Apply ice to the affected area for 15-20 minutes each hour while awake for 2 days. Put the ice in a plastic bag and place a towel between the bag of ice and your skin. °· Rest the injured joint as much as possible, but continue to put the joint through a full range of motion, 4 times per day. (The shoulder joint especially becomes rapidly "frozen" if not used.) When the pain lessens, begin normal slow movements and usual activities. °· Only take over-the-counter or prescription medicines for pain, discomfort or fever as directed by your caregiver. °· Your caregiver may recommend draining the bursa and injecting medicine into the bursa. This may help the healing process. °· Follow all instructions for follow-up with your caregiver. This includes any orthopedic referrals, physical therapy and rehabilitation. Any delay in obtaining necessary care could result in a delay or failure of the bursitis to heal and chronic pain. °SEEK IMMEDIATE MEDICAL CARE IF:  °· Your pain increases even during treatment. °· You develop an oral temperature above 102° F (38.9° C) and have heat and inflammation over the involved bursa. °MAKE SURE YOU:  °· Understand these instructions. °· Will watch your condition. °· Will get help right away if you are not doing well or get worse. °Document Released: 05/04/2000 Document Revised: 07/30/2011 Document Reviewed: 07/27/2013 °[employer]® Patient Information ©2015 [employer]. This information is not intended to replace advice given to you by your health care provider. Make sure you discuss any questions you have with your health care provider. ° °

## 2014-12-09 NOTE — ED Notes (Signed)
States was gardening yesterday and twisted left knee. Very painful to bear weight and slightly swollen

## 2014-12-09 NOTE — ED Provider Notes (Signed)
CSN: [id number]     Arrival date & time 12/09/14  4098 History   First MD Initiated Contact with Patient 12/09/14 1009     Chief Complaint  Patient presents with  . Knee Pain   (Consider location/radiation/quality/duration/timing/severity/associated sxs/prior Treatment) HPI   This 81 year old female who presents today with a painful left medial knee. Thinks that she may have twisted it in the garden yesterday. She does admit started hurting there but they cannot give me a good explanation. Mechanism of injury. She is working on trying to avoid stepping on squash and working on uneven ground. Now it hurts to ambulate with her and she had pain over the anteromedial area where the pes anserine  bursa. She has noticed mild swelling. She has taken to Naprosyn one yesterday and one last night which seemed to help somewhat. He has had no popping clicking or giving way. She has started walking with the walker.  Past Medical History  Diagnosis Date  . PONV (postoperative nausea and vomiting)   . Hypertension     takes Lisinopril daily  . Hyperlipidemia     takes crestor daily  . Carotid bruit     right side more so than left;but per pt not enough to clean out  . Phlebitis     hx of-62yrs ago right leg;takes Plavix daily but on hold for surgery  . History of migraine     stopped with menopause  . Numbness     left foot  . Arthritis   . Joint pain   . Joint swelling   . Back pain   . Osteopenia   . GERD (gastroesophageal reflux disease)     related to some meds;but doesn't take any medications  . Internal hemorrhoids   . Diverticulosis     right  . Urinary frequency   . Urinary urgency   . Nocturia   . Anemia     yrs ago  . Macular degeneration     dry  . Hypothyroidism     takes Synthroid daily  . Constipation     metamucil bid   Past Surgical History  Procedure Laterality Date  . Dilation and curettage of uterus    . Removal of neck cyst  1996  . Removal of breast cyst   1998  . Removal of tubes and ovaries    . Colonoscopy     Family History  Problem Relation Age of Onset  . Heart failure Mother   . Heart failure Father   . Cancer Sister   . Cancer Brother    History  Substance Use Topics  . Smoking status: Never Smoker   . Smokeless tobacco: Not on file  . Alcohol Use: No   OB History    No data available     Review of Systems  Musculoskeletal: Positive for joint swelling and gait problem.  All other systems reviewed and are negative.   Allergies  Aspirin; Codeine; Morphine and related; Other; Scallops; and Sulfa antibiotics  Home Medications   Prior to Admission medications   Medication Sig Start Date End Date Taking? Authorizing Provider  calcium-vitamin D (OSCAL WITH D) 500-200 MG-UNIT per tablet Take 1 tablet by mouth every morning.   Yes Historical Provider, MD  clopidogrel (PLAVIX) 75 MG tablet Take 75 mg by mouth at bedtime.   Yes Historical Provider, MD  Javier Docker Oil 300 MG CAPS Take 300 mg by mouth every morning.   Yes Historical Provider, MD  levothyroxine (SYNTHROID,  LEVOTHROID) 112 MCG tablet Take 112 mcg by mouth at bedtime.   Yes Historical Provider, MD  lisinopril (PRINIVIL,ZESTRIL) 5 MG tablet Take 5 mg by mouth at bedtime.   Yes Historical Provider, MD  psyllium (METAMUCIL) 58.6 % powder Take 1 packet by mouth 2 (two) times daily.   Yes Historical Provider, MD  rosuvastatin (CRESTOR) 5 MG tablet Take 5 mg by mouth at bedtime.   Yes Historical Provider, MD  docusate sodium 100 MG CAPS Take 100 mg by mouth 2 (two) times daily. 02/09/13   Newman Pies, MD  meloxicam (MOBIC) 7.5 MG tablet Take 1 tablet (7.5 mg total) by mouth daily. 12/09/14   Lorin Picket, PA-C  Multiple Vitamins-Minerals (ICAPS) TABS Take 1 tablet by mouth every morning.    Historical Provider, MD  potassium chloride SA (K-DUR,KLOR-CON) 20 MEQ tablet Take 20 mEq by mouth every morning.    Historical Provider, MD  tolterodine (DETROL LA) 4 MG 24 hr  capsule Take 4 mg by mouth at bedtime.    Historical Provider, MD   BP 137/58 mmHg  Pulse 82  Temp(Src) 97.6 F (36.4 C) (Tympanic)  Resp 17  Ht 5\' 3"  (1.6 m)  Wt 138 lb (62.596 kg)  BMI 24.45 kg/m2  SpO2 100% Physical Exam  Constitutional: She is oriented to person, place, and time. She appears well-developed and well-nourished.  HENT:  Head: Normocephalic and atraumatic.  Eyes: EOM are normal. Pupils are equal, round, and reactive to light.  Neck: Neck supple.  Musculoskeletal:  Examination of the left knee shows no joint effusion. Is no retropatellar tenderness. She is reluctant to straighten her knee into full extension because of discomfort. Lateral ligaments are intact to stressing as is the anterior and posterior cruciates. Next the tenderness is over the anterior medial knee lightly distal to the patella in the Pes anserine bursal area. Is no apparent joint line tenderness medial or laterally.  Neurological: She is alert and oriented to person, place, and time.  Skin: Skin is warm and dry.  Psychiatric: She has a normal mood and affect. Her behavior is normal. Judgment and thought content normal.  Nursing note and vitals reviewed.   ED Course  Procedures (including critical care time) Labs Review Labs Reviewed - No data to display  Imaging Review Dg Knee Complete 4 Views Left  12/09/2014   CLINICAL DATA:  Patient twisted knee while gardening. Pain anterior and lateral in location  EXAM: LEFT KNEE - COMPLETE 4+ VIEW  COMPARISON:  None.  FINDINGS: Frontal, bilateral oblique, and lateral views obtained. There is no fracture or dislocation. No appreciable joint effusion. There is slight generalized joint space narrowing. No erosive change. There are foci of meniscal calcification medially and laterally. There are foci of arterial vascular calcification.  IMPRESSION: No fracture or joint effusion. Mild generalized joint space narrowing. Chondrocalcinosis. Chondrocalcinosis may be  seen with osteoarthritis but also may be seen with calcium pyrophosphate deposition disease. No erosions.   Electronically Signed   By: Lowella Grip III M.D.   On: 12/09/2014 10:39     MDM   1. Pes anserine bursitis    Discharge Medication List as of 12/09/2014 11:13 AM    START taking these medications   Details  meloxicam (MOBIC) 7.5 MG tablet Take 1 tablet (7.5 mg total) by mouth daily., Starting 12/09/2014, Until Discontinued, Print       Plan: 1. Test/x-ray results and diagnosis reviewed with patient 2. rx as per orders; risks, benefits, potential  side effects reviewed with patient 3. Recommend supportive treatment with knee immobilizer, symptom avoidance 4. F/u prn if symptoms worsen or don't improve with PCP  I talk with Ms. Odonoghue regarding her findings today. I told her that she needs to put her knee at rest staying out of the garden. Use her walker for ambulation until she is feeling improved. I also decided to place her on some Mobic low dose and asked her to confirm with Dr. Loney Hering that this is okay with the use of the Plavix that she is currently on. She has a follow-up appointment with Dr. Loney Hering next week and they will discuss her possible chondrocalcinosis at that time.  Lorin Picket, PA-C 12/09/14 1441

## 2014-12-16 DIAGNOSIS — E039 Hypothyroidism, unspecified: Secondary | ICD-10-CM | POA: Diagnosis not present

## 2014-12-16 DIAGNOSIS — Z79899 Other long term (current) drug therapy: Secondary | ICD-10-CM | POA: Diagnosis not present

## 2014-12-16 DIAGNOSIS — I10 Essential (primary) hypertension: Secondary | ICD-10-CM | POA: Diagnosis not present

## 2014-12-16 DIAGNOSIS — E780 Pure hypercholesterolemia: Secondary | ICD-10-CM | POA: Diagnosis not present

## 2015-01-14 DIAGNOSIS — X32XXXA Exposure to sunlight, initial encounter: Secondary | ICD-10-CM | POA: Diagnosis not present

## 2015-01-14 DIAGNOSIS — L821 Other seborrheic keratosis: Secondary | ICD-10-CM | POA: Diagnosis not present

## 2015-01-14 DIAGNOSIS — D1801 Hemangioma of skin and subcutaneous tissue: Secondary | ICD-10-CM | POA: Diagnosis not present

## 2015-01-14 DIAGNOSIS — L578 Other skin changes due to chronic exposure to nonionizing radiation: Secondary | ICD-10-CM | POA: Diagnosis not present

## 2015-06-20 DIAGNOSIS — E7800 Pure hypercholesterolemia, unspecified: Secondary | ICD-10-CM | POA: Diagnosis not present

## 2015-06-20 DIAGNOSIS — Z79899 Other long term (current) drug therapy: Secondary | ICD-10-CM | POA: Diagnosis not present

## 2015-06-20 DIAGNOSIS — E039 Hypothyroidism, unspecified: Secondary | ICD-10-CM | POA: Diagnosis not present

## 2015-06-27 DIAGNOSIS — Z79899 Other long term (current) drug therapy: Secondary | ICD-10-CM | POA: Diagnosis not present

## 2015-06-27 DIAGNOSIS — E039 Hypothyroidism, unspecified: Secondary | ICD-10-CM | POA: Diagnosis not present

## 2015-06-27 DIAGNOSIS — E7800 Pure hypercholesterolemia, unspecified: Secondary | ICD-10-CM | POA: Diagnosis not present

## 2015-06-27 DIAGNOSIS — Z0000 Encounter for general adult medical examination without abnormal findings: Secondary | ICD-10-CM | POA: Diagnosis not present

## 2015-07-22 ENCOUNTER — Emergency Department: Payer: Commercial Managed Care - HMO

## 2015-07-22 ENCOUNTER — Emergency Department
Admission: EM | Admit: 2015-07-22 | Discharge: 2015-07-22 | Disposition: A | Discharge status: Home / Self Care | Payer: Commercial Managed Care - HMO | Attending: Emergency Medicine | Admitting: Emergency Medicine

## 2015-07-22 DIAGNOSIS — I10 Essential (primary) hypertension: Secondary | ICD-10-CM | POA: Insufficient documentation

## 2015-07-22 DIAGNOSIS — Z791 Long term (current) use of non-steroidal anti-inflammatories (NSAID): Secondary | ICD-10-CM | POA: Diagnosis not present

## 2015-07-22 DIAGNOSIS — R52 Pain, unspecified: Secondary | ICD-10-CM | POA: Diagnosis not present

## 2015-07-22 DIAGNOSIS — Z7902 Long term (current) use of antithrombotics/antiplatelets: Secondary | ICD-10-CM | POA: Insufficient documentation

## 2015-07-22 DIAGNOSIS — R197 Diarrhea, unspecified: Secondary | ICD-10-CM | POA: Diagnosis not present

## 2015-07-22 DIAGNOSIS — Z79899 Other long term (current) drug therapy: Secondary | ICD-10-CM | POA: Insufficient documentation

## 2015-07-22 DIAGNOSIS — R110 Nausea: Secondary | ICD-10-CM | POA: Diagnosis present

## 2015-07-22 DIAGNOSIS — M6281 Muscle weakness (generalized): Secondary | ICD-10-CM | POA: Diagnosis not present

## 2015-07-22 DIAGNOSIS — R509 Fever, unspecified: Secondary | ICD-10-CM | POA: Diagnosis not present

## 2015-07-22 DIAGNOSIS — R05 Cough: Secondary | ICD-10-CM | POA: Diagnosis not present

## 2015-07-22 LAB — COMPREHENSIVE METABOLIC PANEL
ALBUMIN: 4.1 g/dL (ref 3.5–5.0)
ALT: 20 U/L (ref 14–54)
AST: 41 U/L (ref 15–41)
Alkaline Phosphatase: 43 U/L (ref 38–126)
Anion gap: 11 (ref 5–15)
BUN: 24 mg/dL — AB (ref 6–20)
CHLORIDE: 102 mmol/L (ref 101–111)
CO2: 25 mmol/L (ref 22–32)
Calcium: 8.7 mg/dL — ABNORMAL LOW (ref 8.9–10.3)
Creatinine, Ser: 0.98 mg/dL (ref 0.44–1.00)
GFR calc Af Amer: 59 mL/min — ABNORMAL LOW (ref >60)
GFR calc non Af Amer: 51 mL/min — ABNORMAL LOW (ref >60)
GLUCOSE: 105 mg/dL — AB (ref 65–99)
Potassium: 4.5 mmol/L (ref 3.5–5.1)
Sodium: 138 mmol/L (ref 135–145)
Total Bilirubin: 0.8 mg/dL (ref 0.3–1.2)
Total Protein: 7.1 g/dL (ref 6.5–8.1)

## 2015-07-22 LAB — URINALYSIS COMPLETE WITH MICROSCOPIC (ARMC ONLY)
Bacteria, UA: NONE SEEN
Bilirubin Urine: NEGATIVE
Glucose, UA: NEGATIVE mg/dL
Hgb urine dipstick: NEGATIVE
Leukocytes, UA: NEGATIVE
Nitrite: NEGATIVE
PROTEIN: NEGATIVE mg/dL
SPECIFIC GRAVITY, URINE: 1.018 (ref 1.005–1.030)
pH: 5 (ref 5.0–8.0)

## 2015-07-22 LAB — CBC WITH DIFFERENTIAL/PLATELET
Basophils Absolute: 0 10*3/uL (ref 0–0.1)
Basophils Relative: 1 %
EOS ABS: 0 10*3/uL (ref 0–0.7)
Eosinophils Relative: 0 %
HEMATOCRIT: 35.6 % (ref 35.0–47.0)
HEMOGLOBIN: 11.9 g/dL — AB (ref 12.0–16.0)
LYMPHS ABS: 0.9 10*3/uL — AB (ref 1.0–3.6)
LYMPHS PCT: 17 %
MCH: 30.1 pg (ref 26.0–34.0)
MCHC: 33.3 g/dL (ref 32.0–36.0)
MCV: 90.3 fL (ref 80.0–100.0)
Monocytes Absolute: 0.9 10*3/uL (ref 0.2–0.9)
Monocytes Relative: 16 %
NEUTROS ABS: 3.5 10*3/uL (ref 1.4–6.5)
NEUTROS PCT: 66 %
Platelets: 175 10*3/uL (ref 150–440)
RBC: 3.95 MIL/uL (ref 3.80–5.20)
RDW: 13.2 % (ref 11.5–14.5)
WBC: 5.3 10*3/uL (ref 3.6–11.0)

## 2015-07-22 LAB — TROPONIN I: Troponin I: 0.03 ng/mL (ref <0.031)

## 2015-07-22 LAB — LACTIC ACID, PLASMA: Lactic Acid, Venous: 1.6 mmol/L (ref 0.5–2.0)

## 2015-07-22 LAB — RAPID INFLUENZA A&B ANTIGENS
Influenza A (ARMC): NEGATIVE
Influenza B (ARMC): NEGATIVE

## 2015-07-22 LAB — LIPASE, BLOOD: LIPASE: 46 U/L (ref 11–51)

## 2015-07-22 IMAGING — CR DG ABDOMEN ACUTE W/ 1V CHEST
3 series · 3 of 3 positions shown · non-contrast
Comparison: Chest x-ray on [DATE] CT of the abdomen and pelvis
on [DATE]

CLINICAL DATA: Diarrhea, nausea, cough and weakness.

EXAM:
DG ABDOMEN ACUTE W/ 1V CHEST

[chest pa]
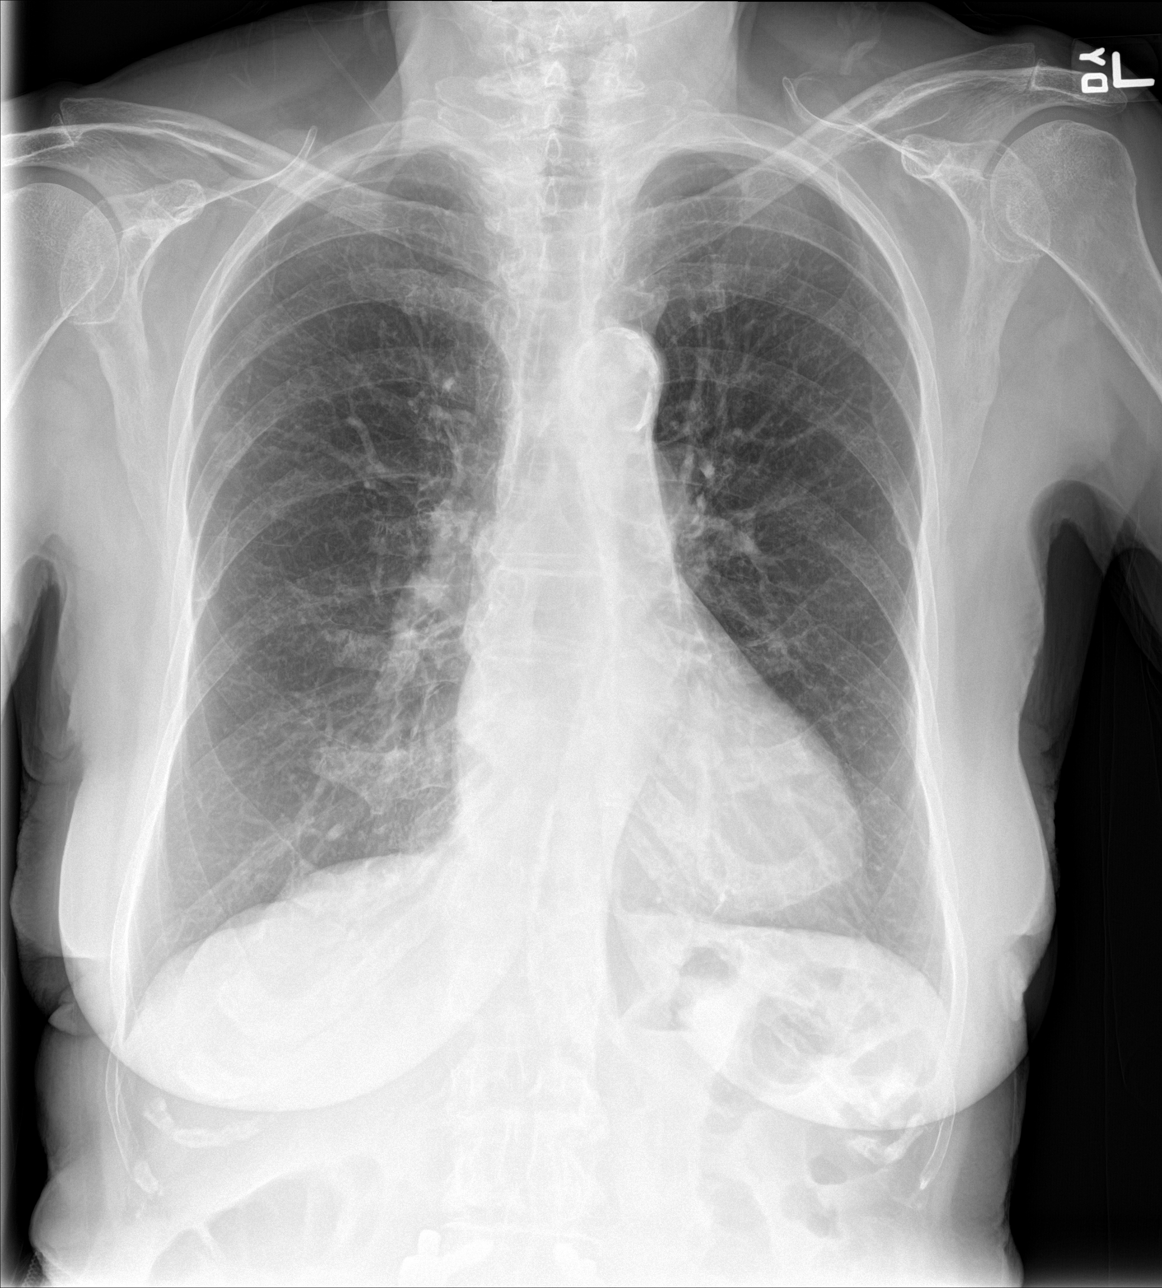

[abdomen erect]
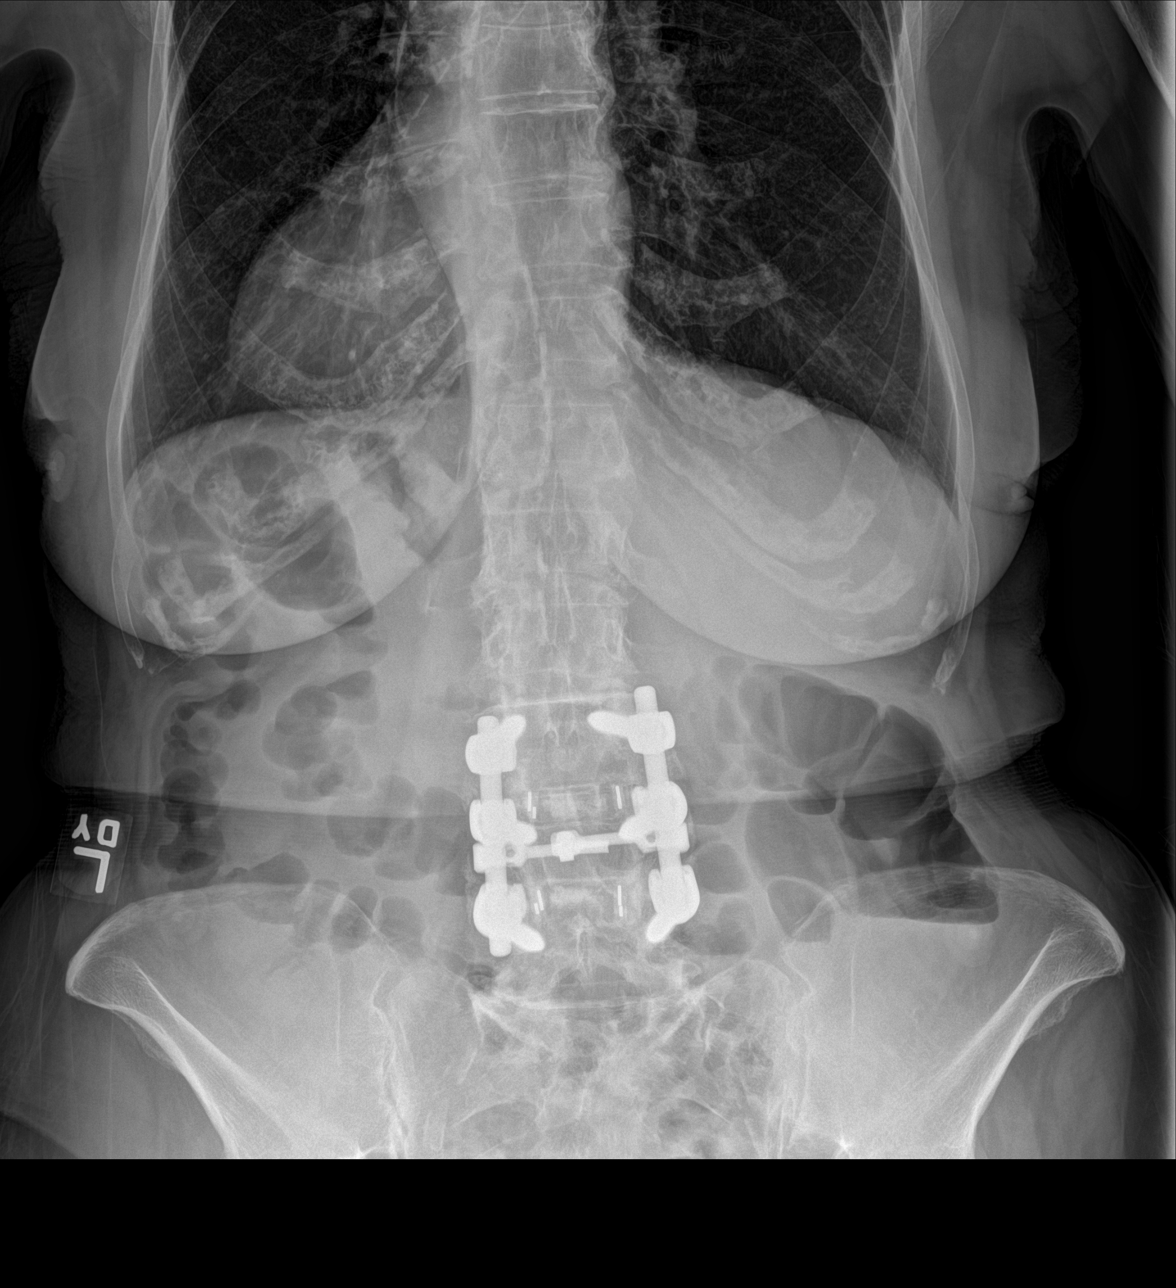

[abdomen supine]
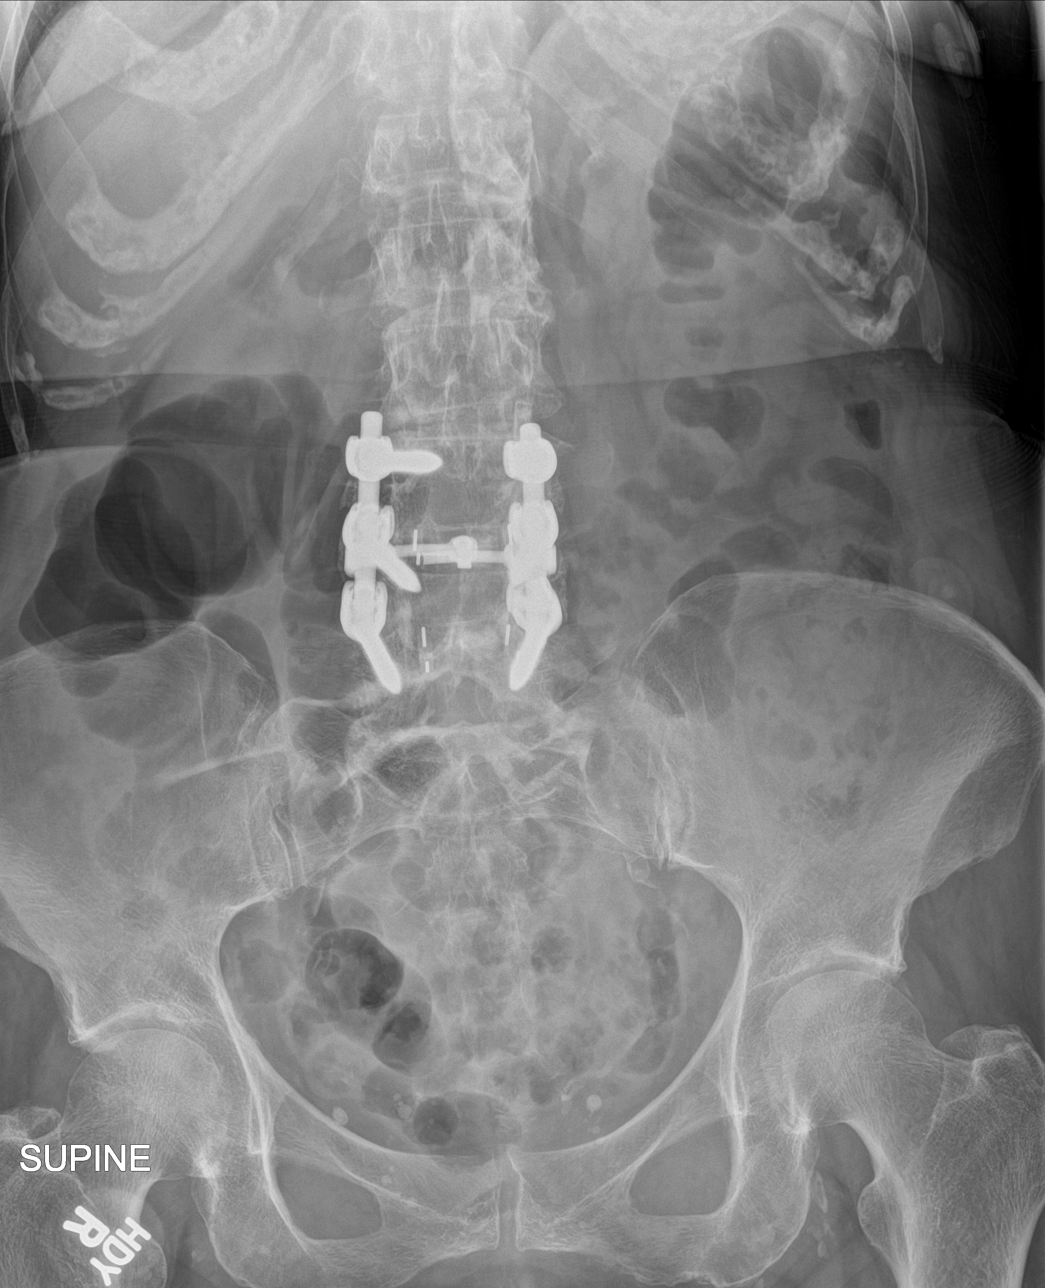

[3 of 3 positions shown; findings below may reference images not displayed]

FINDINGS: There is no evidence of dilated bowel loops or free intraperitoneal
air. No radiopaque calculi or other significant radiographic
abnormality is seen. Evidence of prior lumbar fusion at the L3-4 and
L4-5 levels.

Heart size and mediastinal contours are within normal limits. There
is no evidence of pulmonary edema, consolidation, pneumothorax,
nodule or pleural fluid.
IMPRESSION: Negative abdominal radiographs.  No acute cardiopulmonary disease.

## 2015-07-22 MED ORDER — SODIUM CHLORIDE 0.9 % IV BOLUS (SEPSIS)
1000.0000 mL | Freq: Once | INTRAVENOUS | Status: AC
  Administered 2015-07-22: 1000 mL via INTRAVENOUS

## 2015-07-22 MED ORDER — ONDANSETRON HCL 4 MG PO TABS: 4.0000 mg | ORAL_TABLET | Freq: Three times a day (TID) | ORAL | Status: DC | PRN

## 2015-07-22 NOTE — ED Notes (Signed)
Pt reports nausea and weakness for the last 2 days - Generalized body aches for 2 days - Pt reports diarrhea 6 - 8 times today - Pt reports cough (non-productive)

## 2015-07-22 NOTE — ED Notes (Signed)
Pt reminded that stool sample is needed - at this time she reports she does not have a need to have BM

## 2015-07-22 NOTE — ED Notes (Signed)
Pt made aware that we need a stool and urine sample the next time that she needs to use the restroom

## 2015-07-22 NOTE — Discharge Instructions (Signed)
Return to the emergency department for any worsening condition including abdominal pain, dizziness or passing out, black or bloody stool, fever, concern for dehydration such as not making urine, or any other symptoms concerning to you.   Diarrhea Diarrhea is frequent loose and watery bowel movements. It can cause you to feel weak and dehydrated. Dehydration can cause you to become tired and thirsty, have a dry mouth, and have decreased urination that often is dark yellow. Diarrhea is a sign of another problem, most often an infection that will not last long. In most cases, diarrhea typically lasts 2-3 days. However, it can last longer if it is a sign of something more serious. It is important to treat your diarrhea as directed by your caregiver to lessen or prevent future episodes of diarrhea. CAUSES  Some common causes include:  Gastrointestinal infections caused by viruses, bacteria, or parasites.  Food poisoning or food allergies.  Certain medicines, such as antibiotics, chemotherapy, and laxatives.  Artificial sweeteners and fructose.  Digestive disorders. HOME CARE INSTRUCTIONS  Ensure adequate fluid intake (hydration): Have 1 cup (8 oz) of fluid for each diarrhea episode. Avoid fluids that contain simple sugars or sports drinks, fruit juices, whole milk products, and sodas. Your urine should be clear or pale yellow if you are drinking enough fluids. Hydrate with an oral rehydration solution that you can purchase at pharmacies, retail stores, and online. You can prepare an oral rehydration solution at home by mixing the following ingredients together:   - tsp table salt.   tsp baking soda.   tsp salt substitute containing potassium chloride.  1  tablespoons sugar.  1 L (34 oz) of water.  Certain foods and beverages may increase the speed at which food moves through the gastrointestinal (GI) tract. These foods and beverages should be avoided and include:  Caffeinated and  alcoholic beverages.  High-fiber foods, such as raw fruits and vegetables, nuts, seeds, and whole grain breads and cereals.  Foods and beverages sweetened with sugar alcohols, such as xylitol, sorbitol, and mannitol.  Some foods may be well tolerated and may help thicken stool including:  Starchy foods, such as rice, toast, pasta, low-sugar cereal, oatmeal, grits, baked potatoes, crackers, and bagels.  Bananas.  Applesauce.  Add probiotic-rich foods to help increase healthy bacteria in the GI tract, such as yogurt and fermented milk products.  Wash your hands well after each diarrhea episode.  Only take over-the-counter or prescription medicines as directed by your caregiver.  Take a warm bath to relieve any burning or pain from frequent diarrhea episodes. SEEK IMMEDIATE MEDICAL CARE IF:   You are unable to keep fluids down.  You have persistent vomiting.  You have blood in your stool, or your stools are black and tarry.  You do not urinate in 6-8 hours, or there is only a small amount of very dark urine.  You have abdominal pain that increases or localizes.  You have weakness, dizziness, confusion, or light-headedness.  You have a severe headache.  Your diarrhea gets worse or does not get better.  You have a fever or persistent symptoms for more than 2-3 days.  You have a fever and your symptoms suddenly get worse. MAKE SURE YOU:   Understand these instructions.  Will watch your condition.  Will get help right away if you are not doing well or get worse.   This information is not intended to replace advice given to you by your health care provider. Make sure you discuss any  questions you have with your health care provider.   Document Released: 04/27/2002 Document Revised: 05/28/2014 Document Reviewed: 01/13/2012 Elsevier Interactive Patient Education [employer].

## 2015-07-22 NOTE — ED Provider Notes (Signed)
[facility] Emergency Department Provider Note   ____________________________________________  Time seen: Approximately 2:40 PM I have reviewed the triage vital signs and the triage nursing note.  HISTORY  Chief Complaint Nausea   Historian Patient  HPI Michele [name] is a 82 y.o. female who is from home with her husband and daughter, who is had multiple episodes of watery diarrhea, nonbloody, since Wednesday. She's had approximate 6 episodes today. No known trigger or exposure. She's had some nausea without vomiting. She's had body aches and generalized weakness. She's had a nonproductive cough. Denies chest pain. Denies specific urinary symptoms. She's had abdominal cramping which is diffuse, and not too bad.    Past Medical History  Diagnosis Date  . PONV (postoperative nausea and vomiting)   . Hypertension     takes Lisinopril daily  . Hyperlipidemia     takes crestor daily  . Carotid bruit     right side more so than left;but per pt not enough to clean out  . Phlebitis     hx of-8yrs ago right leg;takes Plavix daily but on hold for surgery  . History of migraine     stopped with menopause  . Numbness     left foot  . Arthritis   . Joint pain   . Joint swelling   . Back pain   . Osteopenia   . GERD (gastroesophageal reflux disease)     related to some meds;but doesn't take any medications  . Internal hemorrhoids   . Diverticulosis     right  . Urinary frequency   . Urinary urgency   . Nocturia   . Anemia     yrs ago  . Macular degeneration     dry  . Hypothyroidism     takes Synthroid daily  . Constipation     metamucil bid    There are no active problems to display for this patient.   Past Surgical History  Procedure Laterality Date  . Dilation and curettage of uterus    . Removal of neck cyst  1996  . Removal of breast cyst  1998  . Removal of tubes and ovaries    . Colonoscopy      Current Outpatient Rx   Name  Route  Sig  Dispense  Refill  . calcium-vitamin D (OSCAL WITH D) 500-200 MG-UNIT per tablet   Oral   Take 1 tablet by mouth every morning.         . clopidogrel (PLAVIX) 75 MG tablet   Oral   Take 75 mg by mouth at bedtime.         . docusate sodium 100 MG CAPS   Oral   Take 100 mg by mouth 2 (two) times daily.   60 capsule   0   . Krill Oil 300 MG CAPS   Oral   Take 300 mg by mouth every morning.         Marland Kitchen levothyroxine (SYNTHROID, LEVOTHROID) 112 MCG tablet   Oral   Take 112 mcg by mouth at bedtime.         Marland Kitchen lisinopril (PRINIVIL,ZESTRIL) 5 MG tablet   Oral   Take 5 mg by mouth at bedtime.         . meloxicam (MOBIC) 7.5 MG tablet   Oral   Take 1 tablet (7.5 mg total) by mouth daily.   14 tablet   0   . Multiple Vitamins-Minerals (ICAPS) TABS   Oral  Take 1 tablet by mouth every morning.         . potassium chloride SA (K-DUR,KLOR-CON) 20 MEQ tablet   Oral   Take 20 mEq by mouth every morning.         . psyllium (METAMUCIL) 58.6 % powder   Oral   Take 1 packet by mouth 2 (two) times daily.         . rosuvastatin (CRESTOR) 5 MG tablet   Oral   Take 5 mg by mouth at bedtime.         . tolterodine (DETROL LA) 4 MG 24 hr capsule   Oral   Take 4 mg by mouth at bedtime.           Allergies Aspirin; Codeine; Morphine and related; Other; Scallops; and Sulfa antibiotics  Family History  Problem Relation Age of Onset  . Heart failure Mother   . Heart failure Father   . Cancer Sister   . Cancer Brother     Social History Social History  Substance Use Topics  . Smoking status: Never Smoker   . Smokeless tobacco: None  . Alcohol Use: No    Review of Systems  Constitutional: Negative for fever. Eyes: Negative for visual changes. ENT: Negative for sore throat. Cardiovascular: Negative for chest pain. Respiratory: Negative for shortness of breath. Positive for cough Gastrointestinal: As per history of present  illness Genitourinary: Negative for dysuria. Musculoskeletal: Negative for back pain. Skin: Negative for rash. Neurological: Negative for headache. 10 point Review of Systems otherwise negative ____________________________________________   PHYSICAL EXAM:  VITAL SIGNS: ED Triage Vitals  Enc Vitals Group     BP 07/22/15 1432 126/62 mmHg     Pulse Rate 07/22/15 1432 83     Resp 07/22/15 1432 14     Temp 07/22/15 1432 98.2 F (36.8 C)     Temp Source 07/22/15 1432 Oral     SpO2 07/22/15 1432 98 %     Weight 07/22/15 1432 133 lb (60.328 kg)     Height 07/22/15 1432 5\' 3"  (1.6 m)     Head Cir --      Peak Flow --      Pain Score 07/22/15 1433 5     Pain Loc --      Pain Edu? --      Excl. in [place]? --      Constitutional: Alert and oriented. Well appearing and in no distress. HEENT   Head: Normocephalic and atraumatic.      Eyes: Conjunctivae are normal. PERRL. Normal extraocular movements.      Ears:         Nose: No congestion/rhinnorhea.   Mouth/Throat: Mucous membranes are mildly dry.   Neck: No stridor. Cardiovascular/Chest: Normal rate, regular rhythm.  No murmurs, rubs, or gallops. Respiratory: Normal respiratory effort without tachypnea nor retractions. Decreased air movement throughout, no wheezing. Mild rhonchi intermittently with a cough. Gastrointestinal: Soft. No distention, no guarding, no rebound. Very mild tenderness diffusely, nonfocal.  Genitourinary/rectal:Deferred Musculoskeletal: Nontender with normal range of motion in all extremities. No joint effusions.  No lower extremity tenderness.  No edema. Neurologic:  Normal speech and language. No gross or focal neurologic deficits are appreciated. Skin:  Skin is warm, dry and intact. No rash noted. Psychiatric: Mood and affect are normal. Speech and behavior are normal. Patient exhibits appropriate insight and judgment.  ____________________________________________   EKG I, Lisa Roca, MD, the  attending physician have personally viewed and interpreted all ECGs.  80 bpm. Normal sinus rhythm Narrow QRS. Normal axis. Normal ST and T-wave ____________________________________________  LABS (pertinent positives/negatives)  Labs and urine pending  ____________________________________________  RADIOLOGY All Xrays were viewed by me. Imaging interpreted by Radiologist.  Acute abdomen with chest pending __________________________________________  PROCEDURES  Procedure(s) performed: None  Critical Care performed: None  ____________________________________________   ED COURSE / ASSESSMENT AND PLAN  Pertinent labs & imaging results that were available during my care of the patient were reviewed by me and considered in my medical decision making (see chart for details).  Patient is overall well-appearing and stable with her vital signs, but she does appear mildly dehydrated clinically and with a history of several days of fluid losses due to watery diarrhea.  She has symptoms consistent with a viral diarrheal illness in the community right now.  No significant risk factors for C. difficile, but we will check this as well.  She's had a cough, and I will go ahead and add a chest x-ray and addition to the abdomen x-rays.  I don't have a significant concern about intra-abdominal emergency such as ischemic colitis, or diverticulitis. At this point in time I don't think I would likely recommend CT scan, but depending on patient's symptoms and blood work may reconsider.  Patient care transferred to Dr. Archie Balboa at shift change 3 PM  X-ray, labs, and blood work are pending. Disposition after results and reevaluation.    CONSULTATIONS:   None   Patient / Family / Caregiver informed of clinical course, medical decision-making process, and agree with plan.     ___________________________________________   FINAL CLINICAL IMPRESSION(S) / ED DIAGNOSES   Final diagnoses:   Diarrhea of presumed infectious origin              Note: This dictation was prepared with Dragon dictation. Any transcriptional errors that result from this process are unintentional   Lisa Roca, MD 07/22/15 1452

## 2015-07-22 NOTE — ED Notes (Signed)
Per EMS pt reports nausea and diarrhea since Wed - Reports weakness and body aches

## 2015-07-28 DIAGNOSIS — J069 Acute upper respiratory infection, unspecified: Secondary | ICD-10-CM | POA: Diagnosis not present

## 2015-08-05 ENCOUNTER — Ambulatory Visit (INDEPENDENT_AMBULATORY_CARE_PROVIDER_SITE_OTHER): Payer: Commercial Managed Care - HMO | Admitting: Obstetrics and Gynecology

## 2015-08-05 ENCOUNTER — Encounter: Payer: Self-pay | Admitting: Obstetrics and Gynecology

## 2015-08-05 VITALS — BP 143/63 | HR 89 | Ht 63.0 in | Wt 139.7 lb

## 2015-08-05 DIAGNOSIS — Z01419 Encounter for gynecological examination (general) (routine) without abnormal findings: Secondary | ICD-10-CM | POA: Diagnosis not present

## 2015-08-05 NOTE — Patient Instructions (Signed)
Place annual gynecologic exam patient instructions here.

## 2015-08-05 NOTE — Progress Notes (Signed)
Subjective:   Michele [name] is a 82 y.o. G70P3 Caucasian female here for a routine well-woman exam.  No LMP recorded. Patient has had a hysterectomy.    Current complaints: abdominal pains -not new PCP: ?         Social History: Sexual: heterosexual Marital Status: married Living situation: with spouse Occupation: retired Tobacco/alcohol: no tobacco use Illicit drugs: no history of illicit drug use  The following portions of the patient's history were reviewed and updated as appropriate: allergies, current medications, past family history, past medical history, past social history, past surgical history and problem list.  Past Medical History Past Medical History  Diagnosis Date  . PONV (postoperative nausea and vomiting)   . Hypertension     takes Lisinopril daily  . Hyperlipidemia     takes crestor daily  . Carotid bruit     right side more so than left;but per pt not enough to clean out  . Phlebitis     hx of-3yrs ago right leg;takes Plavix daily but on hold for surgery  . History of migraine     stopped with menopause  . Numbness     left foot  . Arthritis   . Joint pain   . Joint swelling   . Back pain   . Osteopenia   . GERD (gastroesophageal reflux disease)     related to some meds;but doesn't take any medications  . Internal hemorrhoids   . Diverticulosis     right  . Urinary frequency   . Urinary urgency   . Nocturia   . Anemia     yrs ago  . Macular degeneration     dry  . Hypothyroidism     takes Synthroid daily  . Constipation     metamucil bid    Past Surgical History Past Surgical History  Procedure Laterality Date  . Dilation and curettage of uterus    . Removal of neck cyst  1996  . Removal of breast cyst  1998  . Removal of tubes and ovaries    . Colonoscopy      Gynecologic History G3P3  No LMP recorded. Patient has had a hysterectomy. Contraception: abstinence and post menopausal status Last Pap: ?Marland Kitchen Results were:  normal Last mammogram: 2016. Results were: normal  Obstetric History OB History  Gravida Para Term Preterm AB SAB TAB Ectopic Multiple Living  3 3            # Outcome Date GA Lbr Len/2nd Weight Sex Delivery Anes PTL Lv  3 Para           2 Para           1 Para               Current Medications Current Outpatient Prescriptions on File Prior to Visit  Medication Sig Dispense Refill  . clopidogrel (PLAVIX) 75 MG tablet Take 75 mg by mouth at bedtime.    Javier Docker Oil 300 MG CAPS Take 300 mg by mouth every morning.    Marland Kitchen levothyroxine (SYNTHROID, LEVOTHROID) 112 MCG tablet Take 112 mcg by mouth at bedtime.    Marland Kitchen lisinopril (PRINIVIL,ZESTRIL) 5 MG tablet Take 5 mg by mouth at bedtime.    . Multiple Vitamins-Minerals (ICAPS) TABS Take 1 tablet by mouth every morning.    . psyllium (METAMUCIL) 58.6 % powder Take 1 packet by mouth 2 (two) times daily.    . rosuvastatin (CRESTOR) 5 MG tablet Take 5 mg  by mouth at bedtime.    . calcium-vitamin D (OSCAL WITH D) 500-200 MG-UNIT per tablet Take 1 tablet by mouth every morning. Reported on 08/05/2015    . docusate sodium 100 MG CAPS Take 100 mg by mouth 2 (two) times daily. (Patient not taking: Reported on 08/05/2015) 60 capsule 0  . meloxicam (MOBIC) 7.5 MG tablet Take 1 tablet (7.5 mg total) by mouth daily. (Patient not taking: Reported on 08/05/2015) 14 tablet 0  . ondansetron (ZOFRAN) 4 MG tablet Take 1 tablet (4 mg total) by mouth every 8 (eight) hours as needed for nausea or vomiting. (Patient not taking: Reported on 08/05/2015) 20 tablet 0  . potassium chloride SA (K-DUR,KLOR-CON) 20 MEQ tablet Take 20 mEq by mouth every morning. Reported on 08/05/2015    . tolterodine (DETROL LA) 4 MG 24 hr capsule Take 4 mg by mouth at bedtime. Reported on 08/05/2015     No current facility-administered medications on file prior to visit.    Review of Systems Patient denies any headaches, blurred vision, shortness of breath, chest pain, abdominal pain, problems  with bowel movements, urination, or intercourse.  Objective:  BP 143/63 mmHg  Pulse 89  Ht 5\' 3"  (1.6 m)  Wt 139 lb 11.2 oz (63.368 kg)  BMI 24.75 kg/m2 Physical Exam  General:  Well developed, well nourished, no acute distress. She is alert and oriented x3. Skin:  Warm and dry Neck:  Midline trachea, no thyromegaly or nodules Cardiovascular: Regular rate and rhythm, no murmur heard Lungs:  Effort normal, all lung fields clear to auscultation bilaterally Breasts:  No dominant palpable mass, retraction, or nipple discharge Abdomen:  Soft, non tender, no hepatosplenomegaly or masses Pelvic:  External genitalia is normal in appearance.  The vagina is atrophic in appearance. The cervix is bulbous, no CMT.  Thin prep pap is not done . Uterus is felt to be normal size, shape, and contour.  No adnexal masses or tenderness noted. Extremities:  No swelling or varicosities noted Psych:  She has a normal mood and affect  Assessment:   Healthy well-woman exam S/p menopausal state Family history of ovarian cancer in daughter  Plan:   F/U 1 year for AE, or sooner if needed Mammogram scheduled  Melody Rockney Ghee, CNM

## 2015-08-11 ENCOUNTER — Ambulatory Visit
Admission: RE | Admit: 2015-08-11 | Discharge: 2015-08-11 | Disposition: A | Discharge status: Home / Self Care | Payer: Commercial Managed Care - HMO | Source: Ambulatory Visit | Attending: Obstetrics and Gynecology | Admitting: Obstetrics and Gynecology

## 2015-08-11 DIAGNOSIS — Z1231 Encounter for screening mammogram for malignant neoplasm of breast: Secondary | ICD-10-CM | POA: Insufficient documentation

## 2015-08-11 DIAGNOSIS — Z01419 Encounter for gynecological examination (general) (routine) without abnormal findings: Secondary | ICD-10-CM

## 2015-09-15 DIAGNOSIS — L821 Other seborrheic keratosis: Secondary | ICD-10-CM | POA: Diagnosis not present

## 2015-12-19 DIAGNOSIS — Z79899 Other long term (current) drug therapy: Secondary | ICD-10-CM | POA: Diagnosis not present

## 2015-12-19 DIAGNOSIS — E039 Hypothyroidism, unspecified: Secondary | ICD-10-CM | POA: Diagnosis not present

## 2015-12-19 DIAGNOSIS — E7800 Pure hypercholesterolemia, unspecified: Secondary | ICD-10-CM | POA: Diagnosis not present

## 2015-12-26 DIAGNOSIS — E7800 Pure hypercholesterolemia, unspecified: Secondary | ICD-10-CM | POA: Diagnosis not present

## 2015-12-26 DIAGNOSIS — Z79899 Other long term (current) drug therapy: Secondary | ICD-10-CM | POA: Diagnosis not present

## 2015-12-26 DIAGNOSIS — I10 Essential (primary) hypertension: Secondary | ICD-10-CM | POA: Diagnosis not present

## 2015-12-26 DIAGNOSIS — E039 Hypothyroidism, unspecified: Secondary | ICD-10-CM | POA: Diagnosis not present

## 2016-03-22 DIAGNOSIS — L821 Other seborrheic keratosis: Secondary | ICD-10-CM | POA: Diagnosis not present

## 2016-06-21 DIAGNOSIS — E7800 Pure hypercholesterolemia, unspecified: Secondary | ICD-10-CM | POA: Diagnosis not present

## 2016-06-21 DIAGNOSIS — E039 Hypothyroidism, unspecified: Secondary | ICD-10-CM | POA: Diagnosis not present

## 2016-06-21 DIAGNOSIS — Z79899 Other long term (current) drug therapy: Secondary | ICD-10-CM | POA: Diagnosis not present

## 2016-06-28 DIAGNOSIS — Z0000 Encounter for general adult medical examination without abnormal findings: Secondary | ICD-10-CM | POA: Diagnosis not present

## 2016-06-29 ENCOUNTER — Other Ambulatory Visit: Payer: Self-pay | Admitting: Family Medicine

## 2016-06-29 DIAGNOSIS — Z1231 Encounter for screening mammogram for malignant neoplasm of breast: Secondary | ICD-10-CM

## 2016-08-07 ENCOUNTER — Encounter: Payer: Commercial Managed Care - HMO | Admitting: Obstetrics and Gynecology

## 2016-08-14 ENCOUNTER — Ambulatory Visit
Admission: RE | Admit: 2016-08-14 | Discharge: 2016-08-14 | Disposition: A | Discharge status: Home / Self Care | Payer: Commercial Managed Care - HMO | Source: Ambulatory Visit | Attending: Family Medicine | Admitting: Family Medicine

## 2016-08-14 DIAGNOSIS — Z1231 Encounter for screening mammogram for malignant neoplasm of breast: Secondary | ICD-10-CM | POA: Diagnosis not present

## 2016-09-10 ENCOUNTER — Emergency Department: Payer: Medicare HMO

## 2016-09-10 ENCOUNTER — Encounter: Payer: Self-pay | Admitting: Emergency Medicine

## 2016-09-10 ENCOUNTER — Observation Stay
Admission: EM | Admit: 2016-09-10 | Discharge: 2016-09-11 | Disposition: A | Discharge status: Home / Self Care | Payer: Medicare HMO | Attending: Internal Medicine | Admitting: Internal Medicine

## 2016-09-10 DIAGNOSIS — R011 Cardiac murmur, unspecified: Secondary | ICD-10-CM | POA: Diagnosis not present

## 2016-09-10 DIAGNOSIS — Z803 Family history of malignant neoplasm of breast: Secondary | ICD-10-CM | POA: Diagnosis not present

## 2016-09-10 DIAGNOSIS — M1990 Unspecified osteoarthritis, unspecified site: Secondary | ICD-10-CM | POA: Insufficient documentation

## 2016-09-10 DIAGNOSIS — I119 Hypertensive heart disease without heart failure: Secondary | ICD-10-CM | POA: Insufficient documentation

## 2016-09-10 DIAGNOSIS — Z79899 Other long term (current) drug therapy: Secondary | ICD-10-CM | POA: Insufficient documentation

## 2016-09-10 DIAGNOSIS — E785 Hyperlipidemia, unspecified: Secondary | ICD-10-CM | POA: Insufficient documentation

## 2016-09-10 DIAGNOSIS — R079 Chest pain, unspecified: Secondary | ICD-10-CM | POA: Diagnosis present

## 2016-09-10 DIAGNOSIS — R0602 Shortness of breath: Secondary | ICD-10-CM | POA: Insufficient documentation

## 2016-09-10 DIAGNOSIS — Z886 Allergy status to analgesic agent status: Secondary | ICD-10-CM | POA: Insufficient documentation

## 2016-09-10 DIAGNOSIS — I700 Atherosclerosis of aorta: Secondary | ICD-10-CM | POA: Insufficient documentation

## 2016-09-10 DIAGNOSIS — E039 Hypothyroidism, unspecified: Secondary | ICD-10-CM | POA: Insufficient documentation

## 2016-09-10 DIAGNOSIS — I10 Essential (primary) hypertension: Secondary | ICD-10-CM | POA: Diagnosis not present

## 2016-09-10 DIAGNOSIS — Z882 Allergy status to sulfonamides status: Secondary | ICD-10-CM | POA: Diagnosis not present

## 2016-09-10 DIAGNOSIS — M8580 Other specified disorders of bone density and structure, unspecified site: Secondary | ICD-10-CM | POA: Insufficient documentation

## 2016-09-10 DIAGNOSIS — I2510 Atherosclerotic heart disease of native coronary artery without angina pectoris: Secondary | ICD-10-CM | POA: Diagnosis not present

## 2016-09-10 DIAGNOSIS — Z791 Long term (current) use of non-steroidal anti-inflammatories (NSAID): Secondary | ICD-10-CM | POA: Insufficient documentation

## 2016-09-10 DIAGNOSIS — Z91013 Allergy to seafood: Secondary | ICD-10-CM | POA: Diagnosis not present

## 2016-09-10 DIAGNOSIS — Z885 Allergy status to narcotic agent status: Secondary | ICD-10-CM | POA: Diagnosis not present

## 2016-09-10 DIAGNOSIS — H3530 Unspecified macular degeneration: Secondary | ICD-10-CM | POA: Diagnosis not present

## 2016-09-10 DIAGNOSIS — Z8249 Family history of ischemic heart disease and other diseases of the circulatory system: Secondary | ICD-10-CM | POA: Diagnosis not present

## 2016-09-10 DIAGNOSIS — Z9889 Other specified postprocedural states: Secondary | ICD-10-CM | POA: Insufficient documentation

## 2016-09-10 DIAGNOSIS — Z7902 Long term (current) use of antithrombotics/antiplatelets: Secondary | ICD-10-CM | POA: Diagnosis not present

## 2016-09-10 DIAGNOSIS — K648 Other hemorrhoids: Secondary | ICD-10-CM | POA: Insufficient documentation

## 2016-09-10 DIAGNOSIS — R0789 Other chest pain: Principal | ICD-10-CM | POA: Insufficient documentation

## 2016-09-10 DIAGNOSIS — Z90722 Acquired absence of ovaries, bilateral: Secondary | ICD-10-CM | POA: Insufficient documentation

## 2016-09-10 DIAGNOSIS — K219 Gastro-esophageal reflux disease without esophagitis: Secondary | ICD-10-CM | POA: Insufficient documentation

## 2016-09-10 LAB — HEPATIC FUNCTION PANEL
ALT: 16 U/L (ref 14–54)
AST: 28 U/L (ref 15–41)
Albumin: 4.3 g/dL (ref 3.5–5.0)
Alkaline Phosphatase: 55 U/L (ref 38–126)
Bilirubin, Direct: <0.1 mg/dL — ABNORMAL LOW (ref 0.1–0.5)
TOTAL PROTEIN: 7.3 g/dL (ref 6.5–8.1)
Total Bilirubin: 0.4 mg/dL (ref 0.3–1.2)

## 2016-09-10 LAB — URINALYSIS, COMPLETE (UACMP) WITH MICROSCOPIC
BILIRUBIN URINE: NEGATIVE
Bacteria, UA: NONE SEEN
Glucose, UA: NEGATIVE mg/dL
Hgb urine dipstick: NEGATIVE
Ketones, ur: NEGATIVE mg/dL
LEUKOCYTES UA: NEGATIVE
Nitrite: NEGATIVE
PH: 5 (ref 5.0–8.0)
Protein, ur: NEGATIVE mg/dL
RBC / HPF: NONE SEEN RBC/hpf (ref 0–5)
SPECIFIC GRAVITY, URINE: 1.028 (ref 1.005–1.030)
SQUAMOUS EPITHELIAL / LPF: NONE SEEN
WBC, UA: NONE SEEN WBC/hpf (ref 0–5)

## 2016-09-10 LAB — PROTIME-INR
INR: 0.98
Prothrombin Time: 13 seconds (ref 11.4–15.2)

## 2016-09-10 LAB — FIBRIN DERIVATIVES D-DIMER (ARMC ONLY): FIBRIN DERIVATIVES D-DIMER (ARMC): 3160.72 — AB (ref 0.00–499.00)

## 2016-09-10 LAB — TROPONIN I

## 2016-09-10 LAB — BASIC METABOLIC PANEL
ANION GAP: 6 (ref 5–15)
BUN: 31 mg/dL — AB (ref 6–20)
CO2: 25 mmol/L (ref 22–32)
Calcium: 9.4 mg/dL (ref 8.9–10.3)
Chloride: 107 mmol/L (ref 101–111)
Creatinine, Ser: 1.06 mg/dL — ABNORMAL HIGH (ref 0.44–1.00)
GFR calc Af Amer: 54 mL/min — ABNORMAL LOW (ref >60)
GFR, EST NON AFRICAN AMERICAN: 46 mL/min — AB (ref >60)
Glucose, Bld: 146 mg/dL — ABNORMAL HIGH (ref 65–99)
POTASSIUM: 4.5 mmol/L (ref 3.5–5.1)
SODIUM: 138 mmol/L (ref 135–145)

## 2016-09-10 LAB — BRAIN NATRIURETIC PEPTIDE: B NATRIURETIC PEPTIDE 5: 84 pg/mL (ref 0.0–100.0)

## 2016-09-10 LAB — TSH: TSH: 0.363 u[IU]/mL (ref 0.350–4.500)

## 2016-09-10 IMAGING — CR DG CHEST 2V
1 series · 2 of 2 positions shown · non-contrast
Comparison: [DATE]; [DATE]

CLINICAL DATA: Center pain, SOB starting this morning. Hx of HTN.
Nonsmoker.

EXAM:
CHEST  2 VIEW

[Series 1: dg chest 2 view · 0.14mm/px · 2 of 2 slices shown]
[im 1/2]
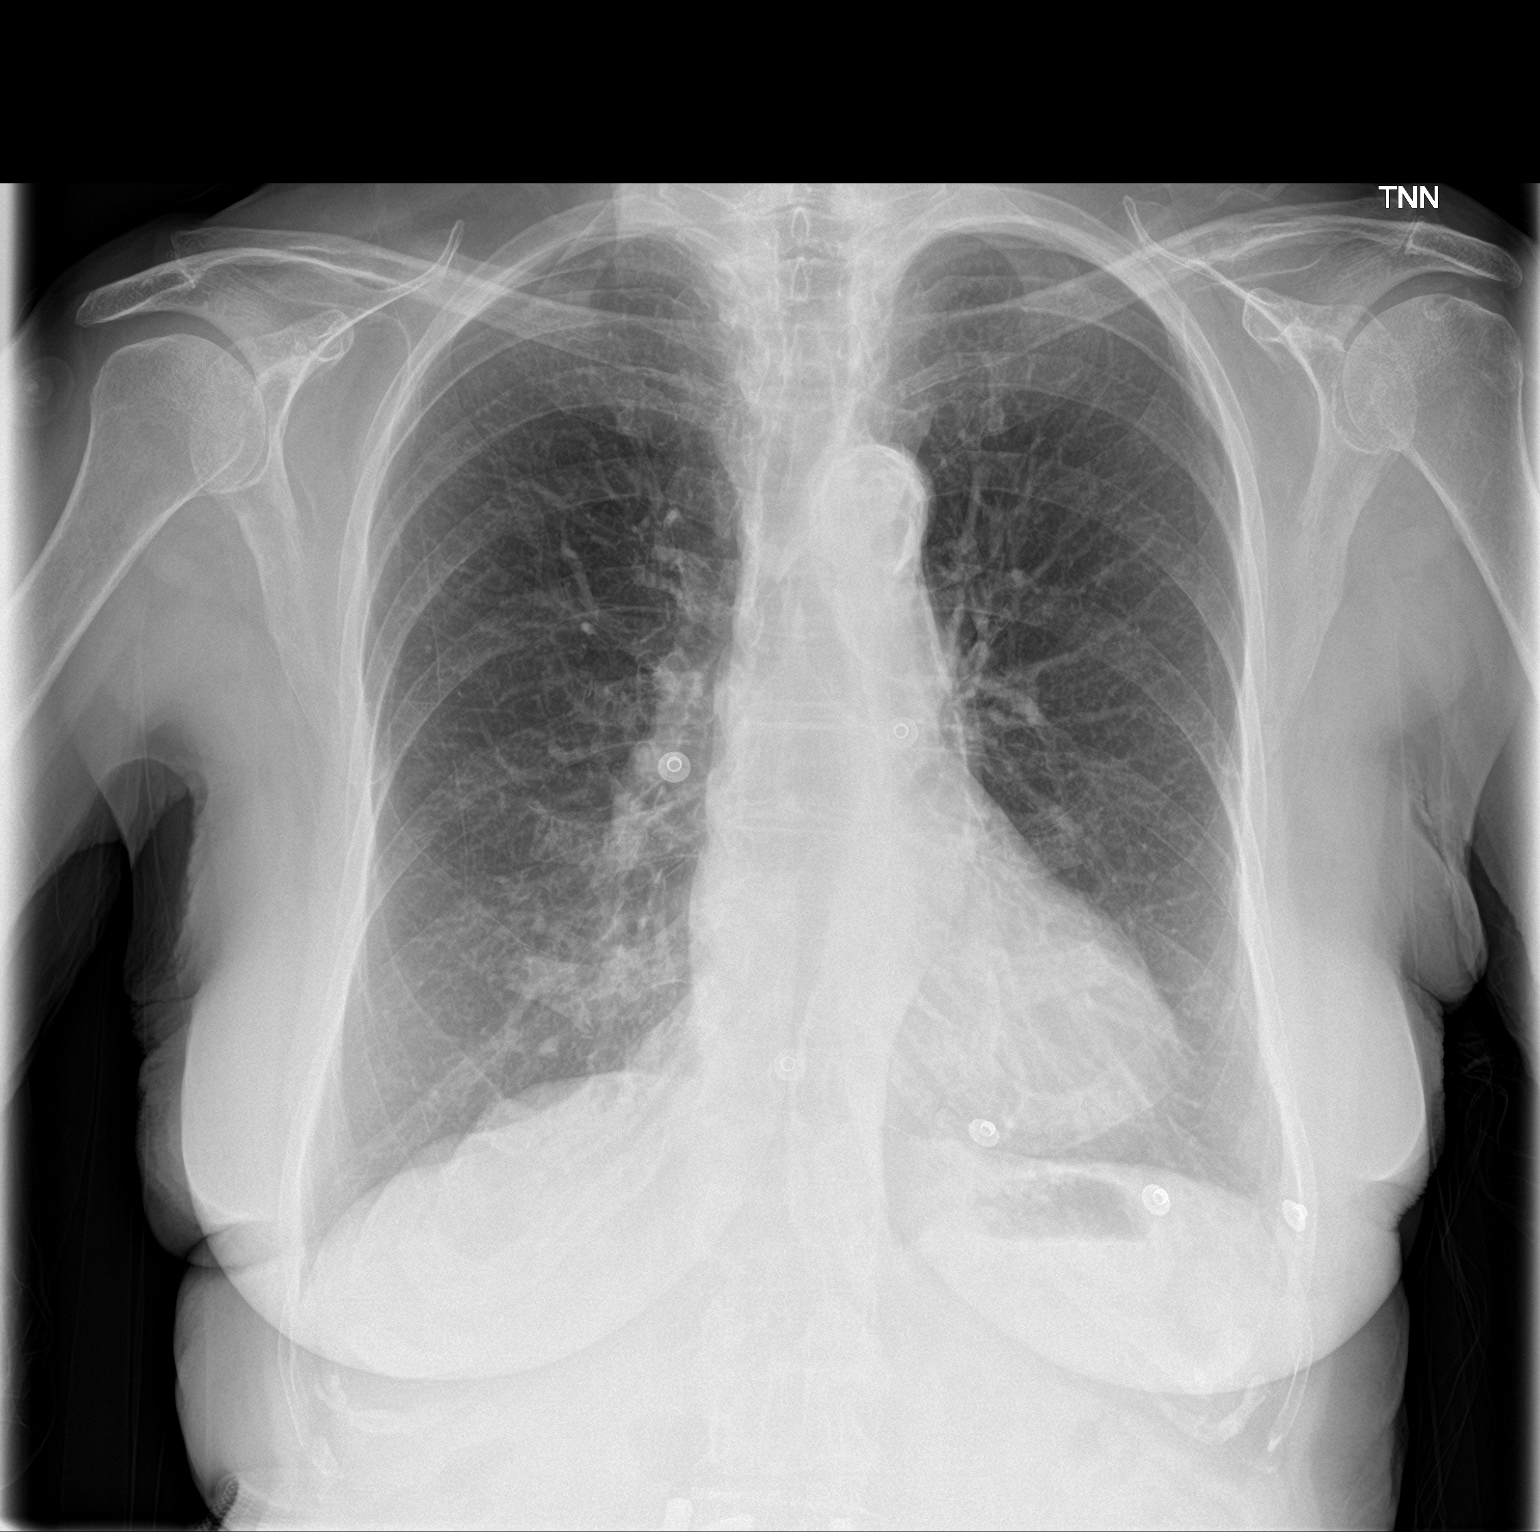
[im 2/2]
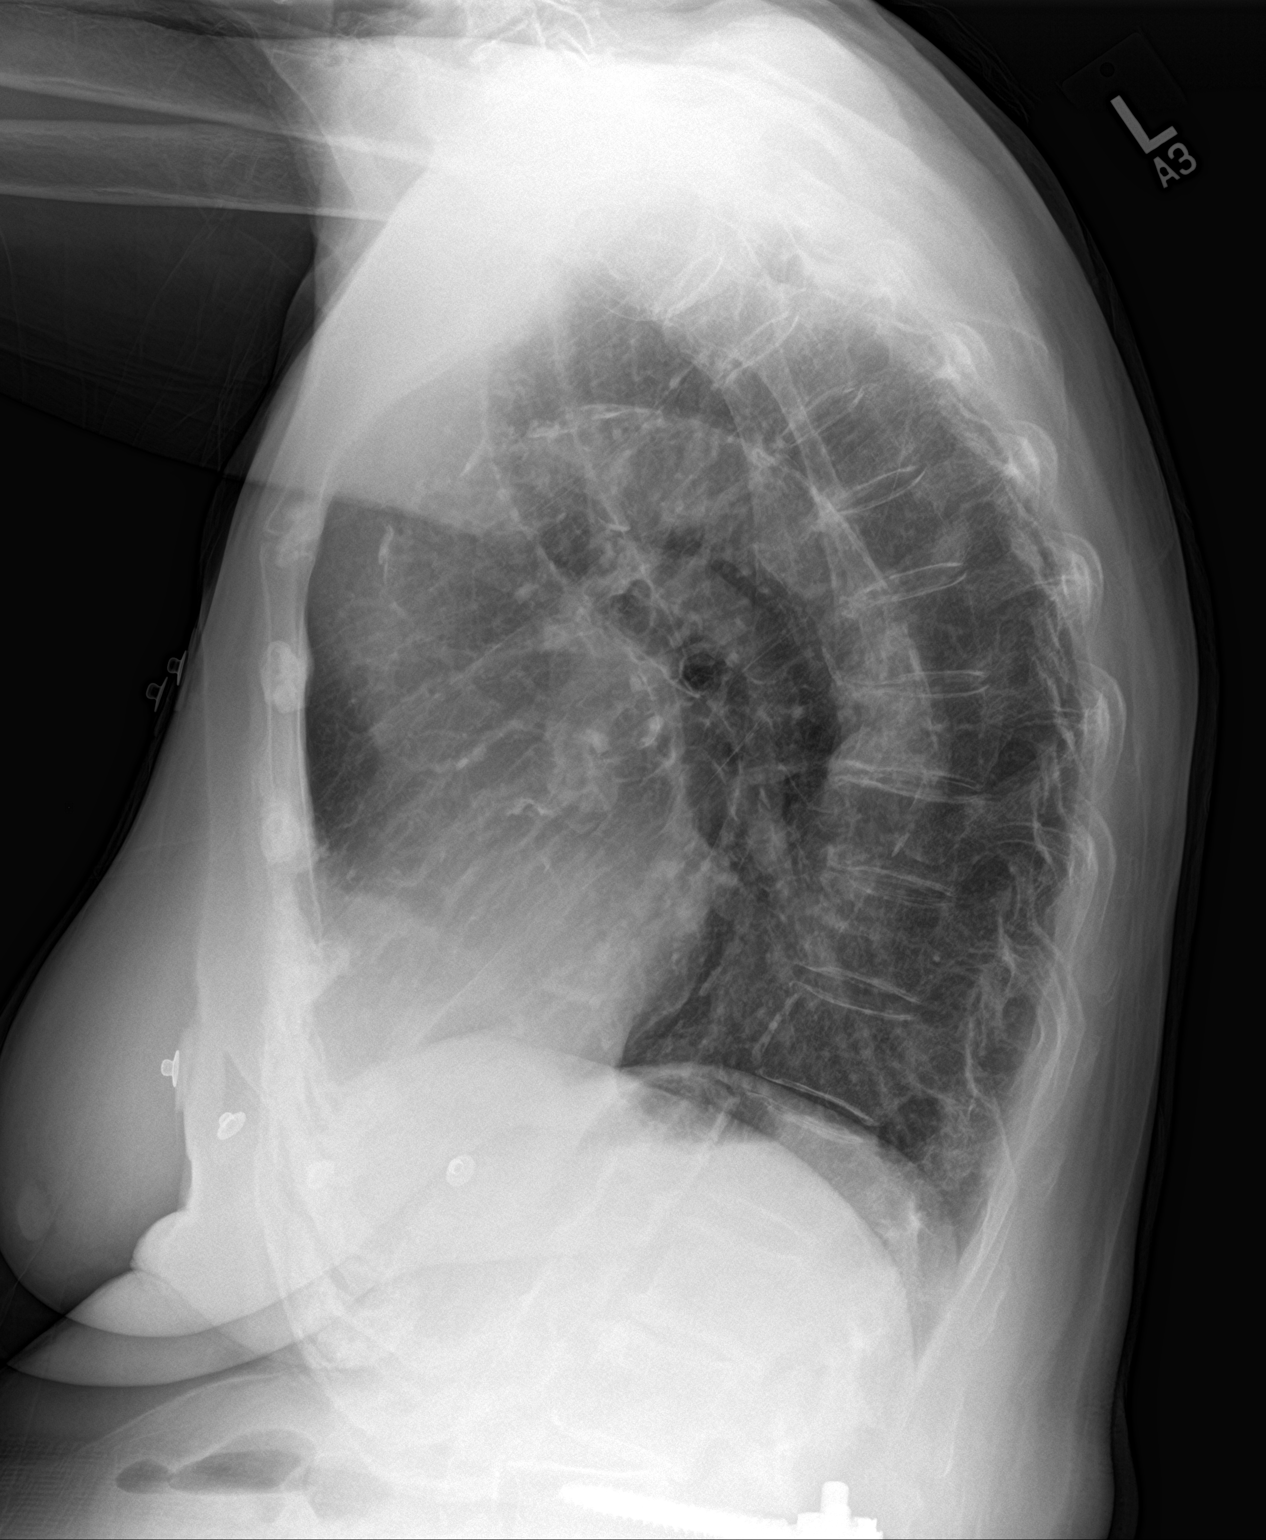

[2 of 2 positions shown; findings below may reference images not displayed]

FINDINGS: Grossly unchanged cardiac silhouette and mediastinal contours with
atherosclerotic plaque within the thoracic aorta. The lungs remain
hyperexpanded with flattening the diaphragms mild diffuse slightly
nodular thickening of the pulmonary interstitium. No focal airspace
opacities. No pleural effusion or pneumothorax. No evidence of
edema. No acute osseus abnormalities. Post lower lumbar paraspinal
fusion, incompletely imaged.
IMPRESSION: 1. Hyperexpanded lungs without acute cardiopulmonary disease.
2.  Aortic Atherosclerosis ([0A]-[0A]).

## 2016-09-10 IMAGING — CT CT ANGIO CHEST
2 of 7 series · 19 of 46 positions shown · IV contrast (APPLIED)
Comparison: Chest radiograph earlier today.  MRI lumbar [DATE]

CLINICAL DATA: Chest pain. History of atrial prolapse. LEFT lower
extremity swelling, elevated D-dimer. Shortness of breath earlier
today.

EXAM:
CT ANGIOGRAPHY CHEST WITH CONTRAST
TECHNIQUE: Multidetector CT imaging of the chest was performed using the
standard protocol during bolus administration of intravenous
contrast. Multiplanar CT image reconstructions and MIPs were
obtained to evaluate the vascular anatomy.
CONTRAST:  Isovue 370, 75 mL.

[Series 5: thins · axial · 0.64mm/px · z∈[-265,+6]mm · 17 of 303 slices shown]
[im 16/303  lung]
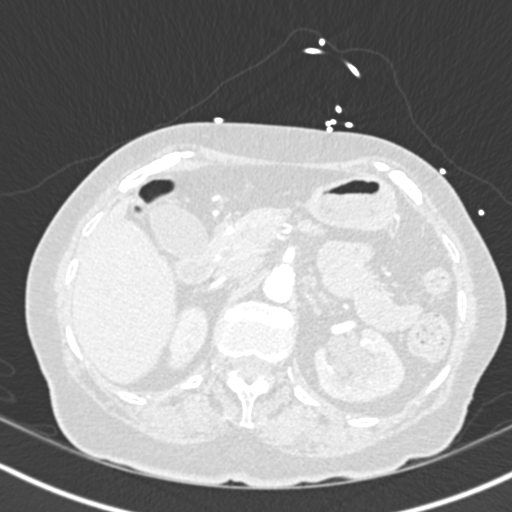
[im 32/303  soft-tissue]
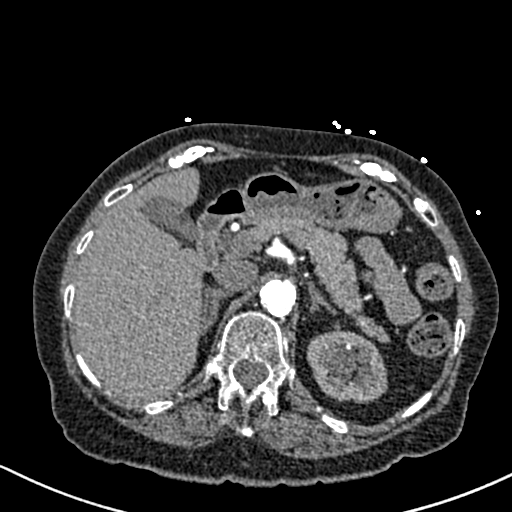
[im 48/303  lung]
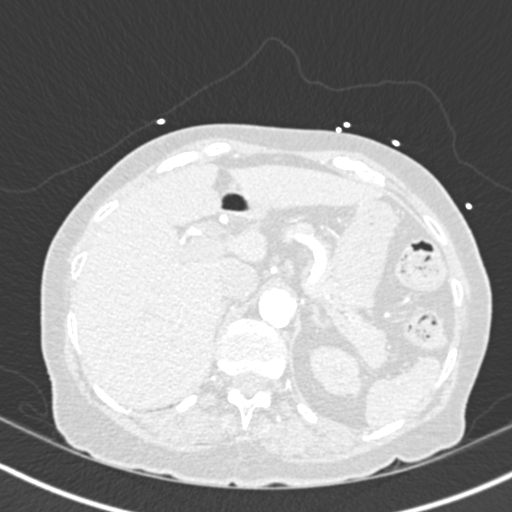
[im 64/303  soft-tissue]
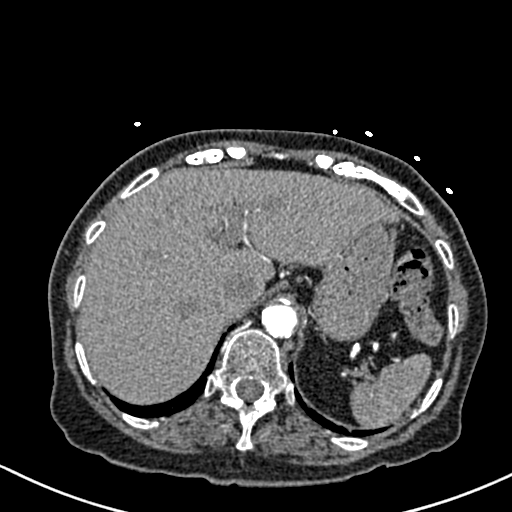
[im 80/303  lung]
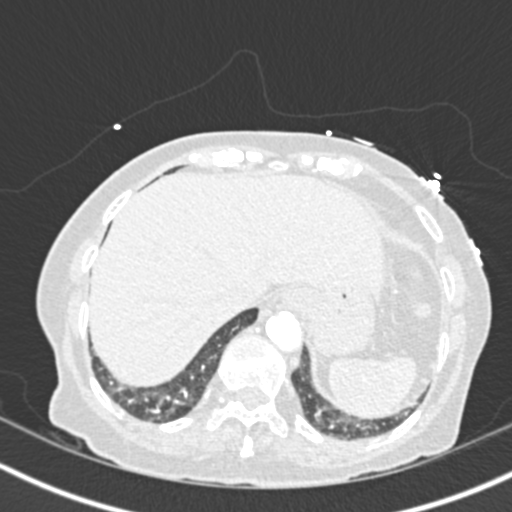
[im 96/303  soft-tissue]
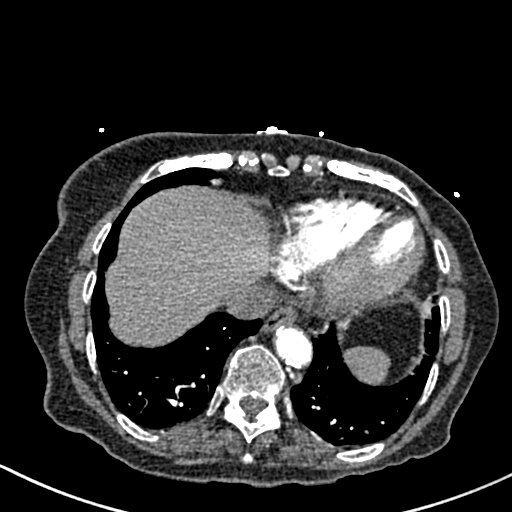
[im 112/303  lung]
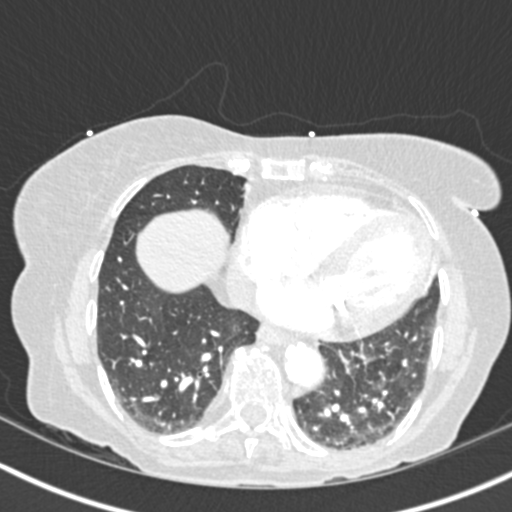
[im 128/303  soft-tissue]
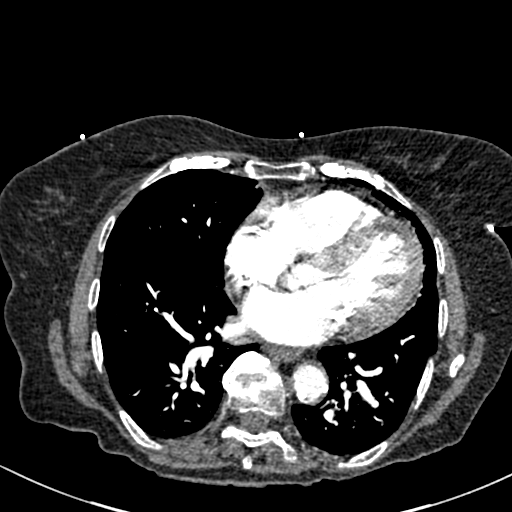
[im 159/303  lung]
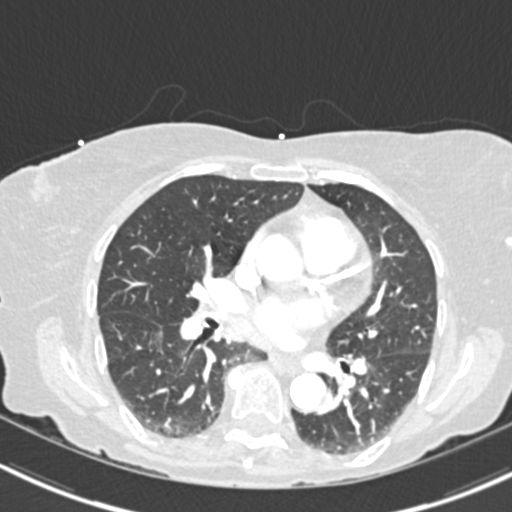
[im 175/303  soft-tissue]
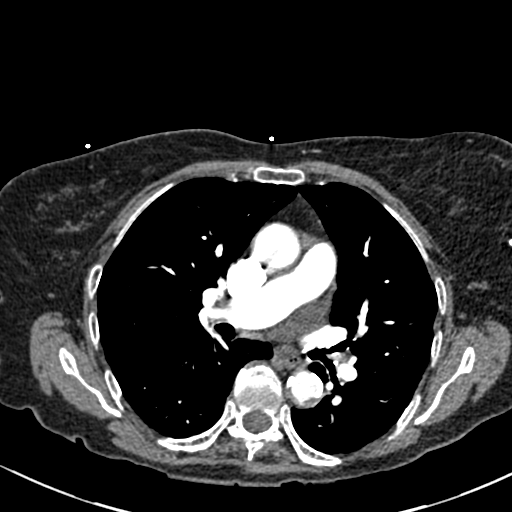
[im 191/303  lung]
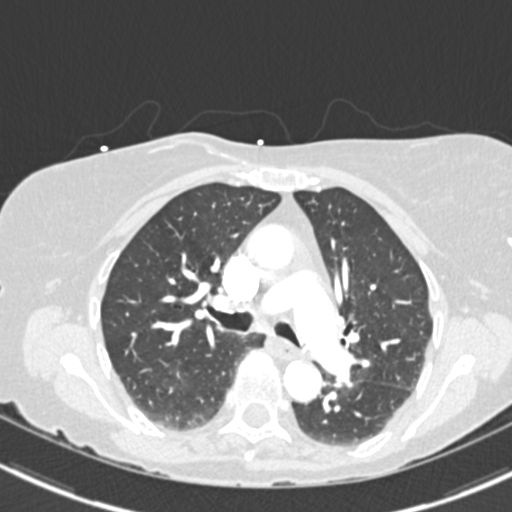
[im 207/303  soft-tissue]
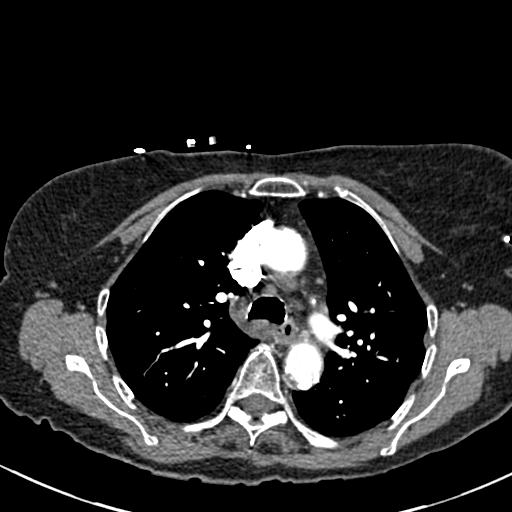
[im 223/303  lung]
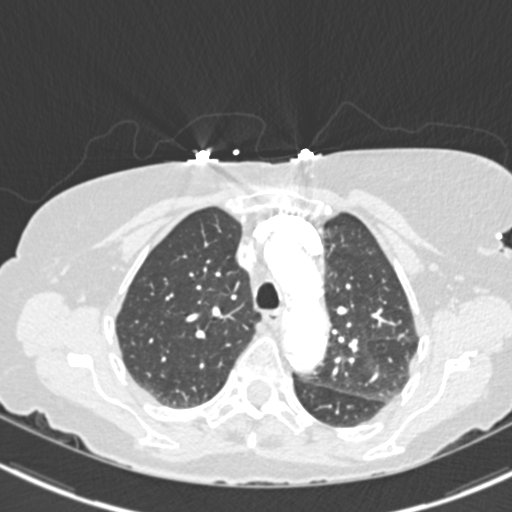
[im 239/303  soft-tissue]
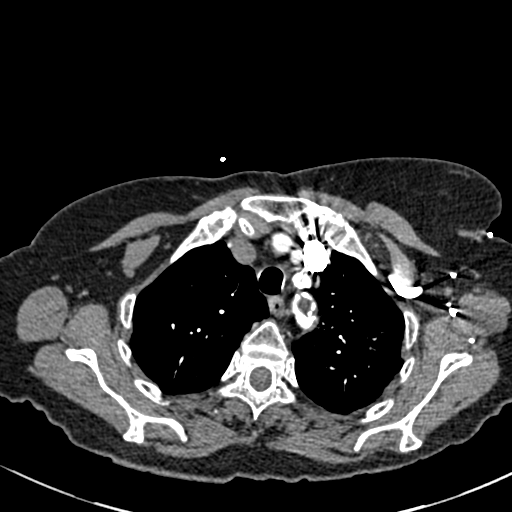
[im 255/303  lung]
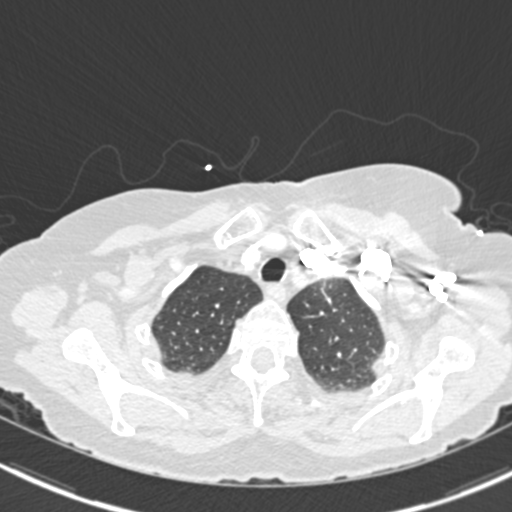
[im 271/303  soft-tissue]
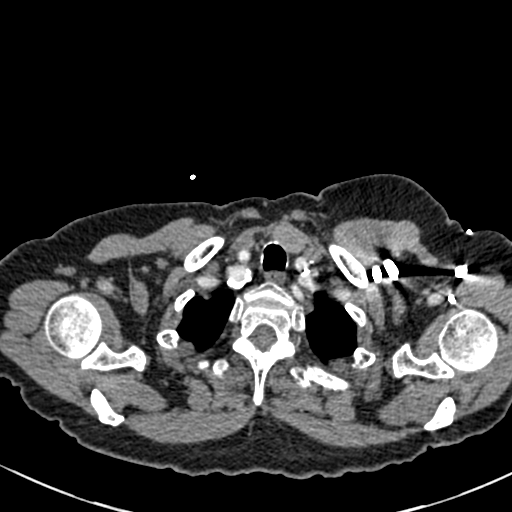
[im 287/303  lung]
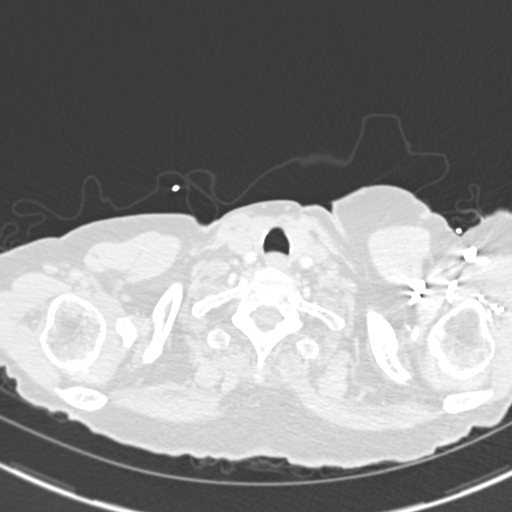

[Series 7: coronal mpr · coronal · 0.59mm/px · 2 of 75 slices shown]
[im 25/75  soft-tissue]
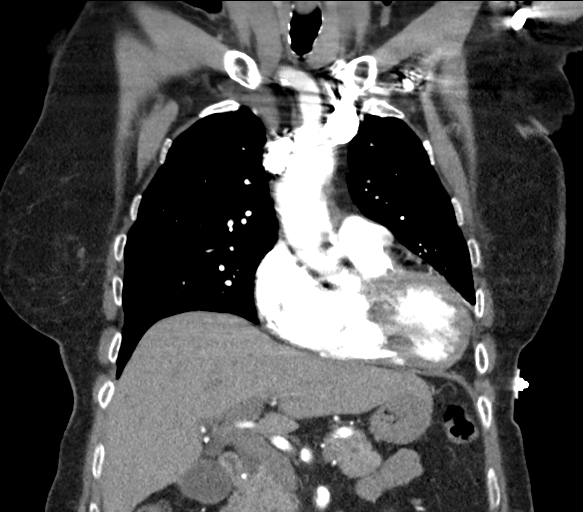
[im 50/75  soft-tissue]
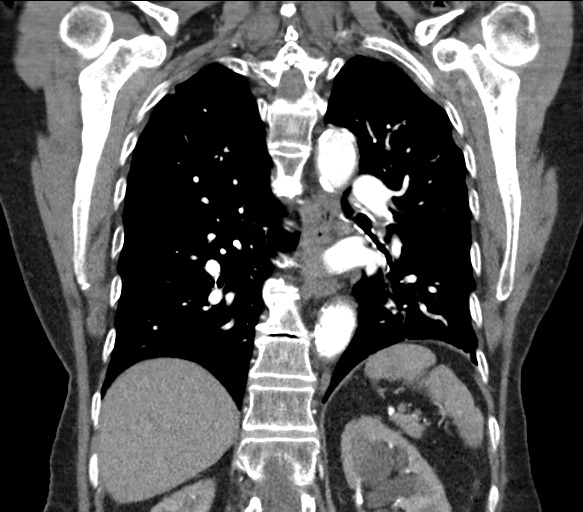

[19 of 46 positions shown; findings below may reference images not displayed]

FINDINGS: Cardiovascular: Satisfactory opacification of the pulmonary arteries
to the segmental level. No evidence of pulmonary embolism.
Cardiomegaly. No pericardial effusion. Extensive atherosclerotic
calcification of the aorta without aneurysmal dilatation. Three
vessel coronary artery calcification.

Mediastinum/Nodes: No enlarged mediastinal, hilar, or axillary lymph
nodes. Thyroid gland, trachea, and esophagus demonstrate no
significant findings.

Lungs/Pleura: No focal airspace opacities. Mild dependent
atelectasis at the bases, greater on the LEFT. No pleural effusion
or pneumothorax.

Upper Abdomen: No acute abnormality. Multicystic change in the LEFT
kidney versus chronic hydronephrosis, incompletely evaluated.
Similar appearance to prior MR.

Musculoskeletal: No chest wall abnormality. No acute or significant
osseous findings. Osteopenia.

Review of the MIP images confirms the above findings.
IMPRESSION: No evidence for acute pulmonary emboli or acute chest pathology.

Aortic atherosclerosis.

Multicystic change in LEFT kidney versus chronic hydronephrosis,
incompletely evaluated, similar to [1I].

## 2016-09-10 MED ORDER — SODIUM CHLORIDE 0.9% FLUSH
3.0000 mL | Freq: Two times a day (BID) | INTRAVENOUS | Status: DC
  Administered 2016-09-10: 3 mL via INTRAVENOUS

## 2016-09-10 MED ORDER — ROSUVASTATIN CALCIUM 10 MG PO TABS
5.0000 mg | ORAL_TABLET | Freq: Every day | ORAL | Status: DC
  Administered 2016-09-10: 5 mg via ORAL
  Filled 2016-09-10: qty 1

## 2016-09-10 MED ORDER — CLOPIDOGREL BISULFATE 75 MG PO TABS: 75.0000 mg | ORAL_TABLET | Freq: Every day | ORAL | Status: DC

## 2016-09-10 MED ORDER — ENOXAPARIN SODIUM 40 MG/0.4ML ~~LOC~~ SOLN
40.0000 mg | SUBCUTANEOUS | Status: DC
  Administered 2016-09-10: 40 mg via SUBCUTANEOUS
  Filled 2016-09-10: qty 0.4

## 2016-09-10 MED ORDER — IOPAMIDOL (ISOVUE-370) INJECTION 76%
75.0000 mL | Freq: Once | INTRAVENOUS | Status: AC | PRN
  Administered 2016-09-10: 75 mL via INTRAVENOUS

## 2016-09-10 MED ORDER — ACETAMINOPHEN 325 MG PO TABS: 650.0000 mg | ORAL_TABLET | Freq: Four times a day (QID) | ORAL | Status: DC | PRN

## 2016-09-10 MED ORDER — ONDANSETRON HCL 4 MG/2ML IJ SOLN
4.0000 mg | Freq: Four times a day (QID) | INTRAMUSCULAR | Status: DC | PRN
  Administered 2016-09-10 – 2016-09-11 (×2): 4 mg via INTRAVENOUS
  Filled 2016-09-10 (×2): qty 2

## 2016-09-10 MED ORDER — ACETAMINOPHEN 650 MG RE SUPP: 650.0000 mg | Freq: Four times a day (QID) | RECTAL | Status: DC | PRN

## 2016-09-10 MED ORDER — DOCUSATE SODIUM 100 MG PO CAPS
100.0000 mg | ORAL_CAPSULE | Freq: Two times a day (BID) | ORAL | Status: DC
  Administered 2016-09-10: 100 mg via ORAL
  Filled 2016-09-10: qty 1

## 2016-09-10 MED ORDER — ONDANSETRON HCL 4 MG PO TABS: 4.0000 mg | ORAL_TABLET | Freq: Three times a day (TID) | ORAL | Status: DC | PRN

## 2016-09-10 MED ORDER — LISINOPRIL 5 MG PO TABS
5.0000 mg | ORAL_TABLET | Freq: Every day | ORAL | Status: DC
  Administered 2016-09-10: 5 mg via ORAL
  Filled 2016-09-10: qty 1

## 2016-09-10 MED ORDER — LEVOTHYROXINE SODIUM 112 MCG PO TABS
112.0000 ug | ORAL_TABLET | Freq: Every day | ORAL | Status: DC
  Administered 2016-09-10: 112 ug via ORAL
  Filled 2016-09-10: qty 1

## 2016-09-10 MED ORDER — ONDANSETRON HCL 4 MG PO TABS: 4.0000 mg | ORAL_TABLET | Freq: Four times a day (QID) | ORAL | Status: DC | PRN

## 2016-09-10 MED ORDER — SODIUM CHLORIDE 0.9% FLUSH: 3.0000 mL | INTRAVENOUS | Status: DC | PRN

## 2016-09-10 MED ORDER — MELOXICAM 7.5 MG PO TABS
7.5000 mg | ORAL_TABLET | Freq: Every day | ORAL | Status: DC
  Filled 2016-09-10: qty 1

## 2016-09-10 MED ORDER — SODIUM CHLORIDE 0.9 % IV SOLN: 250.0000 mL | INTRAVENOUS | Status: DC | PRN

## 2016-09-10 MED ORDER — SODIUM CHLORIDE 0.9% FLUSH: 3.0000 mL | Freq: Two times a day (BID) | INTRAVENOUS | Status: DC

## 2016-09-10 NOTE — ED Notes (Signed)
Pt with CT. 

## 2016-09-10 NOTE — ED Provider Notes (Signed)
[facility] Emergency Department Provider Note  ____________________________________________   First MD Initiated Contact with Patient 09/10/16 1511     (approximate)  I have reviewed the triage vital signs and the nursing notes.   HISTORY  Chief Complaint Chest Pain    HPI Michele [name] is a 83 y.o. female who comes to the department via EMS for exertional chest pain and SOB that began around 8 this morning.  She awoke at 6am and felt fine, but throughout the day noted progressive moderate severity left substernal chest heaviness.  Worse with exertion and improved with rest.  Non radiating.  The shortness of breath is more profound than the chest pain.  She does not have COPD or asthma and has never smoked.  She has never had a DVT or PE.  She has noted some BLE swelling recently, although has not had recent surgery, travel, or immobilization.  She does have a history of HTN.  No hx CVA, MI, PAD.  She is allergic to aspirin, but does take plavix.  EMS gave her 1.5 inches of nitro paste which significantly improved her symptoms.   Past Medical History:  Diagnosis Date  . Anemia    yrs ago  . Arthritis   . Back pain   . Carotid bruit    right side more so than left;but per pt not enough to clean out  . Constipation    metamucil bid  . Diverticulosis    right  . GERD (gastroesophageal reflux disease)    related to some meds;but doesn't take any medications  . History of migraine    stopped with menopause  . Hyperlipidemia    takes crestor daily  . Hypertension    takes Lisinopril daily  . Hypothyroidism    takes Synthroid daily  . Internal hemorrhoids   . Joint pain   . Joint swelling   . Macular degeneration    dry  . Nocturia   . Numbness    left foot  . Osteopenia   . Phlebitis    hx of-110yrs ago right leg;takes Plavix daily but on hold for surgery  . PONV (postoperative nausea and vomiting)   . Urinary frequency   .  Urinary urgency     Patient Active Problem List   Diagnosis Date Noted  . Chest pain 09/10/2016    Past Surgical History:  Procedure Laterality Date  . BREAST BIOPSY Right 12/1997   neg  . BREAST CYST ASPIRATION Right 06/21/1954   rt fna- milk duct-neg  . COLONOSCOPY    . DILATION AND CURETTAGE OF UTERUS    . OOPHORECTOMY Bilateral   . removal of breast cyst  1998  . removal of neck cyst  1996  . removal of tubes and ovaries      Prior to Admission medications   Medication Sig Start Date End Date Taking? Authorizing Provider  clopidogrel (PLAVIX) 75 MG tablet Take 75 mg by mouth at bedtime.   Yes Historical Provider, MD  levothyroxine (SYNTHROID, LEVOTHROID) 112 MCG tablet Take 112 mcg by mouth at bedtime.   Yes Historical Provider, MD  lisinopril (PRINIVIL,ZESTRIL) 5 MG tablet Take 5 mg by mouth at bedtime.   Yes Historical Provider, MD  Multiple Vitamins-Minerals (PRESERVISION AREDS 2 PO) Take 1 capsule by mouth 2 (two) times daily.   Yes Historical Provider, MD  rosuvastatin (CRESTOR) 5 MG tablet Take 5 mg by mouth at bedtime.   Yes Historical Provider, MD  docusate sodium  100 MG CAPS Take 100 mg by mouth 2 (two) times daily. Patient not taking: Reported on 08/05/2015 02/09/13   Newman Pies, MD  meloxicam (MOBIC) 7.5 MG tablet Take 1 tablet (7.5 mg total) by mouth daily. Patient not taking: Reported on 08/05/2015 12/09/14   Lorin Picket, PA-C  ondansetron (ZOFRAN) 4 MG tablet Take 1 tablet (4 mg total) by mouth every 8 (eight) hours as needed for nausea or vomiting. Patient not taking: Reported on 08/05/2015 07/22/15   Nance Pear, MD    Allergies Aspirin; Codeine; Morphine and related; Other; Scallops [shellfish allergy]; and Sulfa antibiotics  Family History  Problem Relation Age of Onset  . Heart failure Mother   . Heart failure Father   . Cancer Sister   . Breast cancer Sister 72  . Cancer Brother   . Breast cancer Cousin     3 pat cousins    Social  History Social History  Substance Use Topics  . Smoking status: Never Smoker  . Smokeless tobacco: Not on file  . Alcohol use No    Review of Systems Constitutional: No fever/chills Eyes: No visual changes. ENT: No sore throat. Cardiovascular: + chest pain. Respiratory: + shortness of breath. Gastrointestinal: No abdominal pain.  No nausea, no vomiting.  No diarrhea.  No constipation. Genitourinary: Negative for dysuria. Musculoskeletal: Negative for back pain. Skin: Negative for rash. Neurological: Negative for headaches, focal weakness or numbness.  10-point ROS otherwise negative.  ____________________________________________   PHYSICAL EXAM:  VITAL SIGNS: ED Triage Vitals  Enc Vitals Group     BP      Pulse      Resp      Temp      Temp src      SpO2      Weight      Height      Head Circumference      Peak Flow      Pain Score      Pain Loc      Pain Edu?      Excl. in Park?     Constitutional: Alert and oriented x 4 well appearing nontoxic no diaphoresis speaks in full, clear sentences Eyes: PERRL EOMI. Head: Atraumatic. Nose: No congestion/rhinnorhea. Mouth/Throat: No trismus Neck: No stridor.   Cardiovascular: Normal rate, regular rhythm. 3/6 SEM best heard at right sternal border.  Able to lie completely flat with no JVD Respiratory: Normal respiratory effort.  No retractions. Lungs CTAB and moving good air Gastrointestinal: Soft nondistended nontender no rebound no guarding no peritonitis no McBurney's tenderness negative Rovsing's no costovertebral tenderness negative Murphy's Musculoskeletal: BLE swelling, but LLE > RLE.  No erythema warmth or tenderness Neurologic:  Normal speech and language. No gross focal neurologic deficits are appreciated. Skin:  Skin is warm, dry and intact. No rash noted. Psychiatric: Mood and affect are normal. Speech and behavior are normal.    ____________________________________________   DIFFERENTIAL  ACS, PE,  aortic dissection, boerhave's syndrome, pneumothorax, pneumonia ____________________________________________   LABS (all labs ordered are listed, but only abnormal results are displayed)  Labs Reviewed  BASIC METABOLIC PANEL - Abnormal; Notable for the following:       Result Value   Glucose, Bld 146 (*)    BUN 31 (*)    Creatinine, Ser 1.06 (*)    GFR calc non Af Amer 46 (*)    GFR calc Af Amer 54 (*)    All other components within normal limits  HEPATIC FUNCTION PANEL -  Abnormal; Notable for the following:    Bilirubin, Direct <0.1 (*)    All other components within normal limits  URINALYSIS, COMPLETE (UACMP) WITH MICROSCOPIC - Abnormal; Notable for the following:    Color, Urine YELLOW (*)    APPearance CLEAR (*)    All other components within normal limits  FIBRIN DERIVATIVES D-DIMER ([facility] ONLY) - Abnormal; Notable for the following:    Fibrin derivatives D-dimer ([facility]) 3,160.72 (*)    All other components within normal limits  PROTIME-INR  BRAIN NATRIURETIC PEPTIDE  TROPONIN I  TSH  LIPID PANEL  TROPONIN I  TROPONIN I  CBC  BASIC METABOLIC PANEL    First troponin negative first d-dimer significantly elevated __________________________________________  EKG  ED ECG REPORT I, Darel Hong, the attending physician, personally viewed and interpreted this ECG.  Date: 09/11/2016 Rate: 95 Rhythm: normal sinus rhythm QRS Axis: normal Intervals: normal ST/T Wave abnormalities: normal Conduction Disturbances: First-degree AV block Narrative Interpretation: Borderline  ____________________________________________  RADIOLOGY  CT MG of the chest with no signs of pulmonary embolism or aortic dissection ____________________________________________   PROCEDURES  Procedure(s) performed: no  Procedures  Critical Care performed: no  ____________________________________________   INITIAL IMPRESSION / ASSESSMENT AND PLAN / ED COURSE  Pertinent labs & imaging  results that were available during my care of the patient were reviewed by me and considered in my medical decision making (see chart for details).  The patient arrives quite well appearing with normal vitals and a nonischemic first ekg, but a story that is quite concerning for ACS.  Her first HEART score is 5 pending troponin.  She does have unequal lower extremity swelling which in conjunction with exertional SOB is concerning for PE as well.  Dimer added on to labs.  Dispo is likely admission.   The patient's d-dimer was significantly elevated so I obtained a CT scan which fortunately is negative. Regardless she requires inpatient admission for cardiac risk stratification  ____________________________________________   FINAL CLINICAL IMPRESSION(S) / ED DIAGNOSES  Final diagnoses:  Chest pain, unspecified type      NEW MEDICATIONS STARTED DURING THIS VISIT:  Current Discharge Medication List       Note:  This document was prepared using Dragon voice recognition software and may include unintentional dictation errors.     Darel Hong, MD 09/11/16 [phone]

## 2016-09-10 NOTE — ED Triage Notes (Signed)
Pt presents to ED via EMS c/o chest pain. Pt has nitro-paste applied to left upper chests by EMS.

## 2016-09-10 NOTE — ED Notes (Signed)
Pt back from x-ray.

## 2016-09-10 NOTE — H&P (Signed)
[facility] [place] NAME: Michele [name]    MR#:  [id number]  DATE OF BIRTH:  05/01/1928  DATE OF ADMISSION:  09/10/2016  PRIMARY CARE PHYSICIAN: BABAOFF, Caryl Bis, MD   REQUESTING/REFERRING PHYSICIAN: Darel Hong  CHIEF COMPLAINT:   Chief Complaint  Patient presents with  . Chest Pain    HISTORY OF PRESENT ILLNESS: Michele [name]  is a 83 y.o. female with a known history of OA,Essential hypertension, GERD and hypothyroidism who is presenting to the emergency room with complaint of substernal chest pain that started earlier this morning. Patient reports that she's been having dyspnea on exertion ongoing for the past few months. Progressively getting worse with activity. She also has occasional chest pain but this time the pain persisted and her shortness of breath persisted. She came to the emergency room her cardiac enzymes were negative. However her d-dimer was elevated. Therefore she underwent a CT scan of the chest per pulmonary embolism which was negative for pulmonary embolism. Currently is chest pain-free PAST MEDICAL HISTORY:   Past Medical History:  Diagnosis Date  . Anemia    yrs ago  . Arthritis   . Back pain   . Carotid bruit    right side more so than left;but per pt not enough to clean out  . Constipation    metamucil bid  . Diverticulosis    right  . GERD (gastroesophageal reflux disease)    related to some meds;but doesn't take any medications  . History of migraine    stopped with menopause  . Hyperlipidemia    takes crestor daily  . Hypertension    takes Lisinopril daily  . Hypothyroidism    takes Synthroid daily  . Internal hemorrhoids   . Joint pain   . Joint swelling   . Macular degeneration    dry  . Nocturia   . Numbness    left foot  . Osteopenia   . Phlebitis    hx of-63yrs ago right leg;takes Plavix daily but on hold for surgery  . PONV (postoperative nausea and vomiting)   . Urinary frequency    . Urinary urgency     PAST SURGICAL HISTORY: Past Surgical History:  Procedure Laterality Date  . BREAST BIOPSY Right 12/1997   neg  . BREAST CYST ASPIRATION Right 06/21/1954   rt fna- milk duct-neg  . COLONOSCOPY    . DILATION AND CURETTAGE OF UTERUS    . OOPHORECTOMY Bilateral   . removal of breast cyst  1998  . removal of neck cyst  1996  . removal of tubes and ovaries      SOCIAL HISTORY:  Social History  Substance Use Topics  . Smoking status: Never Smoker  . Smokeless tobacco: Not on file  . Alcohol use No    FAMILY HISTORY:  Family History  Problem Relation Age of Onset  . Heart failure Mother   . Heart failure Father   . Cancer Sister   . Breast cancer Sister 79  . Cancer Brother   . Breast cancer Cousin     3 pat cousins    DRUG ALLERGIES:  Allergies  Allergen Reactions  . Aspirin Other (See Comments)    Patient collapsed, was rushed to the hospital and hospitalized for a week.  . Codeine Nausea And Vomiting  . Morphine And Related Nausea And Vomiting  . Other Nausea And Vomiting    AGENT:  anesthesia  . Scallops [Shellfish Allergy] Nausea  And Vomiting    Headaches  . Sulfa Antibiotics Hives    REVIEW OF SYSTEMS:   CONSTITUTIONAL: No fever, fatigue or weakness.  EYES: No blurred or double vision.  EARS, NOSE, AND THROAT: No tinnitus or ear pain.  RESPIRATORY: No cough, Positive shortness of breath with exertion, wheezing or hemoptysis.  CARDIOVASCULAR: Positive chest pain, orthopnea, edema.  GASTROINTESTINAL: No nausea, vomiting, diarrhea or abdominal pain.  GENITOURINARY: No dysuria, hematuria.  ENDOCRINE: No polyuria, nocturia,  HEMATOLOGY: No anemia, easy bruising or bleeding SKIN: No rash or lesion. MUSCULOSKELETAL: No joint pain or arthritis.   NEUROLOGIC: No tingling, numbness, weakness.  PSYCHIATRY: No anxiety or depression.   MEDICATIONS AT HOME:  Prior to Admission medications   Medication Sig Start Date End Date Taking?  Authorizing Provider  clopidogrel (PLAVIX) 75 MG tablet Take 75 mg by mouth at bedtime.   Yes Historical Provider, MD  levothyroxine (SYNTHROID, LEVOTHROID) 112 MCG tablet Take 112 mcg by mouth at bedtime.   Yes Historical Provider, MD  lisinopril (PRINIVIL,ZESTRIL) 5 MG tablet Take 5 mg by mouth at bedtime.   Yes Historical Provider, MD  Multiple Vitamins-Minerals (PRESERVISION AREDS 2 PO) Take 1 capsule by mouth 2 (two) times daily.   Yes Historical Provider, MD  rosuvastatin (CRESTOR) 5 MG tablet Take 5 mg by mouth at bedtime.   Yes Historical Provider, MD  docusate sodium 100 MG CAPS Take 100 mg by mouth 2 (two) times daily. Patient not taking: Reported on 08/05/2015 02/09/13   Newman Pies, MD  meloxicam (MOBIC) 7.5 MG tablet Take 1 tablet (7.5 mg total) by mouth daily. Patient not taking: Reported on 08/05/2015 12/09/14   Lorin Picket, PA-C  ondansetron (ZOFRAN) 4 MG tablet Take 1 tablet (4 mg total) by mouth every 8 (eight) hours as needed for nausea or vomiting. Patient not taking: Reported on 08/05/2015 07/22/15   Nance Pear, MD      PHYSICAL EXAMINATION:   VITAL SIGNS: Blood pressure 101/61, pulse 90, temperature 97.5 F (36.4 C), temperature source Oral, resp. rate 12, height 5\' 4"  (1.626 m), weight 140 lb (63.5 kg), SpO2 97 %.  GENERAL:  83 y.o.-year-old patient lying in the bed with no acute distress.  EYES: Pupils equal, round, reactive to light and accommodation. No scleral icterus. Extraocular muscles intact.  HEENT: Head atraumatic, normocephalic. Oropharynx and nasopharynx clear.  NECK:  Supple, no jugular venous distention. No thyroid enlargement, no tenderness.  LUNGS: Normal breath sounds bilaterally, no wheezing, rales,rhonchi or crepitation. No use of accessory muscles of respiration.  CARDIOVASCULAR: S1, S2 Positive systolic murmur at the left sternal border rubs, or gallops.  ABDOMEN: Soft, nontender, nondistended. Bowel sounds present. No organomegaly or mass.   EXTREMITIES: No pedal edema, cyanosis, or clubbing.  NEUROLOGIC: Cranial nerves II through XII are intact. Muscle strength 5/5 in all extremities. Sensation intact. Gait not checked.  PSYCHIATRIC: The patient is alert and oriented x 3.  SKIN: No obvious rash, lesion, or ulcer.   LABORATORY PANEL:   CBC No results for input(s): WBC, HGB, HCT, PLT, MCV, MCH, MCHC, RDW, LYMPHSABS, MONOABS, EOSABS, BASOSABS, BANDABS in the last 168 hours.  Invalid input(s): NEUTRABS, BANDSABD ------------------------------------------------------------------------------------------------------------------  Chemistries   Recent Labs Lab 09/10/16 1515  NA 138  K 4.5  CL 107  CO2 25  GLUCOSE 146*  BUN 31*  CREATININE 1.06*  CALCIUM 9.4  AST 28  ALT 16  ALKPHOS 55  BILITOT 0.4   ------------------------------------------------------------------------------------------------------------------ estimated creatinine clearance is 32.9 mL/min (A) (by  C-G formula based on SCr of 1.06 mg/dL (H)). ------------------------------------------------------------------------------------------------------------------  Recent Labs  09/10/16 1515  TSH 0.363     Coagulation profile  Recent Labs Lab 09/10/16 1515  INR 0.98   ------------------------------------------------------------------------------------------------------------------- No results for input(s): DDIMER in the last 72 hours. -------------------------------------------------------------------------------------------------------------------  Cardiac Enzymes  Recent Labs Lab 09/10/16 1515  TROPONINI <0.03   ------------------------------------------------------------------------------------------------------------------ Invalid input(s): POCBNP  ---------------------------------------------------------------------------------------------------------------  Urinalysis    Component Value Date/Time   COLORURINE YELLOW (A)  07/22/2015 1448   APPEARANCEUR CLEAR (A) 07/22/2015 1448   LABSPEC 1.018 07/22/2015 1448   PHURINE 5.0 07/22/2015 1448   GLUCOSEU NEGATIVE 07/22/2015 1448   HGBUR NEGATIVE 07/22/2015 1448   BILIRUBINUR NEGATIVE 07/22/2015 1448   KETONESUR 1+ (A) 07/22/2015 1448   PROTEINUR NEGATIVE 07/22/2015 1448   NITRITE NEGATIVE 07/22/2015 1448   LEUKOCYTESUR NEGATIVE 07/22/2015 1448     RADIOLOGY: Dg Chest 2 View  Result Date: 09/10/2016 CLINICAL DATA:  Center pain, SOB starting this morning. Hx of HTN. Nonsmoker. EXAM: CHEST  2 VIEW COMPARISON:  07/22/2015; 02/02/2013 FINDINGS: Grossly unchanged cardiac silhouette and mediastinal contours with atherosclerotic plaque within the thoracic aorta. The lungs remain hyperexpanded with flattening the diaphragms mild diffuse slightly nodular thickening of the pulmonary interstitium. No focal airspace opacities. No pleural effusion or pneumothorax. No evidence of edema. No acute osseus abnormalities. Post lower lumbar paraspinal fusion, incompletely imaged. IMPRESSION: 1. Hyperexpanded lungs without acute cardiopulmonary disease. 2.  Aortic Atherosclerosis (ICD10-I70.0). Electronically Signed   By: Sandi Mariscal M.D.   On: 09/10/2016 15:43   Ct Angio Chest Pe W/cm &/or Wo Cm  Result Date: 09/10/2016 CLINICAL DATA:  Chest pain. History of atrial prolapse. LEFT lower extremity swelling, elevated D-dimer. Shortness of breath earlier today. EXAM: CT ANGIOGRAPHY CHEST WITH CONTRAST TECHNIQUE: Multidetector CT imaging of the chest was performed using the standard protocol during bolus administration of intravenous contrast. Multiplanar CT image reconstructions and MIPs were obtained to evaluate the vascular anatomy. CONTRAST:  Isovue 370, 75 mL. COMPARISON:  Chest radiograph earlier today.  MRI lumbar 12/02/2012 FINDINGS: Cardiovascular: Satisfactory opacification of the pulmonary arteries to the segmental level. No evidence of pulmonary embolism. Cardiomegaly. No  pericardial effusion. Extensive atherosclerotic calcification of the aorta without aneurysmal dilatation. Three vessel coronary artery calcification. Mediastinum/Nodes: No enlarged mediastinal, hilar, or axillary lymph nodes. Thyroid gland, trachea, and esophagus demonstrate no significant findings. Lungs/Pleura: No focal airspace opacities. Mild dependent atelectasis at the bases, greater on the LEFT. No pleural effusion or pneumothorax. Upper Abdomen: No acute abnormality. Multicystic change in the LEFT kidney versus chronic hydronephrosis, incompletely evaluated. Similar appearance to prior MR. Musculoskeletal: No chest wall abnormality. No acute or significant osseous findings. Osteopenia. Review of the MIP images confirms the above findings. IMPRESSION: No evidence for acute pulmonary emboli or acute chest pathology. Aortic atherosclerosis. Multicystic change in LEFT kidney versus chronic hydronephrosis, incompletely evaluated, similar to 2015. Electronically Signed   By: Staci Righter M.D.   On: 09/10/2016 17:02    EKG: Orders placed or performed during the hospital encounter of 09/10/16  . ED EKG  . ED EKG  . EKG 12-Lead  . EKG 12-Lead    IMPRESSION AND PLAN: Patient is a 83 year old white female presenting with chest  1. Chest pain with exertional dyspnea At a higher risk of underlying coronary artery disease we will admit under observation obtain a stress test for the morning Due to allergy to aspirin will continue Plavix  2. Dyspnea on exertion I will obtain echocardiogram of the  heart to further evaluate the murmur  3. Essential hypertension we'll continue lisinopril  4. Hyperlipidemia unspecified continue Crestor  5. Hypothyroidism continue Synthroid  6. Miscellaneous Lovenox for DVT prophylaxis All the records are reviewed and case discussed with ED provider. Management plans discussed with the patient, family and they are in agreement.  CODE STATUS: Code Status History     Date Active Date Inactive Code Status Order ID Comments User Context   02/05/2013  5:37 PM 02/09/2013  4:20 PM Full Code [id number]  Newman Pies, MD Inpatient       TOTAL TIME TAKING CARE OF THIS PATIENT: 44minutes.    Dustin Flock M.D on 09/10/2016 at 5:52 PM  Between 7am to 6pm - Pager - [phone]  After 6pm go to [email or web address] - password [email or web address] [facility]  Office  [phone]  CC: Primary care physician; BABAOFF, Caryl Bis, MD

## 2016-09-11 ENCOUNTER — Observation Stay (HOSPITAL_BASED_OUTPATIENT_CLINIC_OR_DEPARTMENT_OTHER)
Admit: 2016-09-11 | Discharge: 2016-09-11 | Disposition: A | Discharge status: Home / Self Care | Payer: Medicare HMO | Attending: Internal Medicine | Admitting: Internal Medicine

## 2016-09-11 ENCOUNTER — Observation Stay (HOSPITAL_BASED_OUTPATIENT_CLINIC_OR_DEPARTMENT_OTHER): Payer: Medicare HMO

## 2016-09-11 DIAGNOSIS — R079 Chest pain, unspecified: Secondary | ICD-10-CM

## 2016-09-11 DIAGNOSIS — R0789 Other chest pain: Secondary | ICD-10-CM

## 2016-09-11 DIAGNOSIS — E785 Hyperlipidemia, unspecified: Secondary | ICD-10-CM | POA: Diagnosis not present

## 2016-09-11 DIAGNOSIS — I10 Essential (primary) hypertension: Secondary | ICD-10-CM | POA: Diagnosis not present

## 2016-09-11 DIAGNOSIS — R011 Cardiac murmur, unspecified: Secondary | ICD-10-CM | POA: Diagnosis not present

## 2016-09-11 DIAGNOSIS — R0600 Dyspnea, unspecified: Secondary | ICD-10-CM | POA: Diagnosis not present

## 2016-09-11 LAB — CBC
HCT: 31.8 % — ABNORMAL LOW (ref 35.0–47.0)
Hemoglobin: 10.7 g/dL — ABNORMAL LOW (ref 12.0–16.0)
MCH: 30.4 pg (ref 26.0–34.0)
MCHC: 33.6 g/dL (ref 32.0–36.0)
MCV: 90.3 fL (ref 80.0–100.0)
PLATELETS: 176 10*3/uL (ref 150–440)
RBC: 3.52 MIL/uL — ABNORMAL LOW (ref 3.80–5.20)
RDW: 12.9 % (ref 11.5–14.5)
WBC: 6.8 10*3/uL (ref 3.6–11.0)

## 2016-09-11 LAB — NM MYOCAR MULTI W/SPECT W/WALL MOTION / EF
CHL CUP RESTING HR STRESS: 73 {beats}/min
CSEPPHR: 107 {beats}/min
LV dias vol: 59 mL (ref 46–106)
LV sys vol: 21 mL
Percent HR: 79 %
TID: 1.13

## 2016-09-11 LAB — BASIC METABOLIC PANEL
Anion gap: 6 (ref 5–15)
BUN: 23 mg/dL — AB (ref 6–20)
CALCIUM: 8.9 mg/dL (ref 8.9–10.3)
CHLORIDE: 107 mmol/L (ref 101–111)
CO2: 27 mmol/L (ref 22–32)
CREATININE: 1.15 mg/dL — AB (ref 0.44–1.00)
GFR calc Af Amer: 48 mL/min — ABNORMAL LOW (ref >60)
GFR calc non Af Amer: 42 mL/min — ABNORMAL LOW (ref >60)
Glucose, Bld: 94 mg/dL (ref 65–99)
Potassium: 4.6 mmol/L (ref 3.5–5.1)
SODIUM: 140 mmol/L (ref 135–145)

## 2016-09-11 LAB — LIPID PANEL
CHOL/HDL RATIO: 3.4 ratio
Cholesterol: 171 mg/dL (ref 0–200)
HDL: 51 mg/dL (ref >40)
LDL Cholesterol: 94 mg/dL (ref 0–99)
Triglycerides: 130 mg/dL (ref <150)
VLDL: 26 mg/dL (ref 0–40)

## 2016-09-11 LAB — ECHOCARDIOGRAM COMPLETE
HEIGHTINCHES: 64 in
WEIGHTICAEL: 2281.6 [oz_av]

## 2016-09-11 LAB — TROPONIN I: Troponin I: <0.03 ng/mL (ref <0.03)

## 2016-09-11 MED ORDER — PANTOPRAZOLE SODIUM 40 MG PO TBEC: 40.0000 mg | DELAYED_RELEASE_TABLET | Freq: Every day | ORAL | 0 refills | Status: DC

## 2016-09-11 MED ORDER — PANTOPRAZOLE SODIUM 40 MG PO TBEC
40.0000 mg | DELAYED_RELEASE_TABLET | Freq: Every day | ORAL | Status: DC
  Administered 2016-09-11: 40 mg via ORAL
  Filled 2016-09-11: qty 1

## 2016-09-11 MED ORDER — TECHNETIUM TC 99M TETROFOSMIN IV KIT
13.7400 | PACK | Freq: Once | INTRAVENOUS | Status: AC | PRN
  Administered 2016-09-11: 13.74 via INTRAVENOUS

## 2016-09-11 MED ORDER — TECHNETIUM TC 99M TETROFOSMIN IV KIT
29.8220 | PACK | Freq: Once | INTRAVENOUS | Status: AC | PRN
  Administered 2016-09-11: 29.822 via INTRAVENOUS

## 2016-09-11 MED ORDER — REGADENOSON 0.4 MG/5ML IV SOLN
0.4000 mg | Freq: Once | INTRAVENOUS | Status: AC
  Administered 2016-09-11: 0.4 mg via INTRAVENOUS

## 2016-09-11 NOTE — Progress Notes (Signed)
*  PRELIMINARY RESULTS* Echocardiogram 2D Echocardiogram has been performed.  Sherrie Sport 09/11/2016, 10:55 AM

## 2016-09-11 NOTE — Care Management Obs Status (Signed)
[facility] NOTIFICATION   Patient Details  Name: Michele [name] MRN: [id number] Date of Birth: 10-09-1927   Medicare Observation Status Notification Given:  Yes Husband signed.  Patient not available   Katrina Stack, RN 09/11/2016, 10:01 AM

## 2016-09-11 NOTE — Care Management (Signed)
Placed in observation for chest pain.  Troponins negative. For stress test.  Resides at [facility] independent living with her husband.

## 2016-09-11 NOTE — Progress Notes (Signed)
Patient ambulated independently using walker without any difficulty of complains.She did x1 lap round the nurses station and back to room.nausea subsided with zofran

## 2016-09-13 NOTE — Discharge Summary (Signed)
[facility] [place] NAME: Michele [name]    MR#:  [id number]  DATE OF BIRTH:  Jul 12, 1927  DATE OF ADMISSION:  09/10/2016 ADMITTING PHYSICIAN: Dustin Flock, MD  DATE OF DISCHARGE: 09/11/2016  9:02 PM  PRIMARY CARE PHYSICIAN: BABAOFF, MARC E, MD   ADMISSION DIAGNOSIS:  Chest pain [R07.9] Chest pain, unspecified type [R07.9]  DISCHARGE DIAGNOSIS:  Active Problems:   Chest pain   SECONDARY DIAGNOSIS:   Past Medical History:  Diagnosis Date  . Anemia    yrs ago  . Arthritis   . Back pain   . Carotid bruit    right side more so than left;but per pt not enough to clean out  . Constipation    metamucil bid  . Diverticulosis    right  . GERD (gastroesophageal reflux disease)    related to some meds;but doesn't take any medications  . History of migraine    stopped with menopause  . Hyperlipidemia    takes crestor daily  . Hypertension    takes Lisinopril daily  . Hypothyroidism    takes Synthroid daily  . Internal hemorrhoids   . Joint pain   . Joint swelling   . Macular degeneration    dry  . Nocturia   . Numbness    left foot  . Osteopenia   . Phlebitis    hx of-75yrs ago right leg;takes Plavix daily but on hold for surgery  . PONV (postoperative nausea and vomiting)   . Urinary frequency   . Urinary urgency      ADMITTING HISTORY  HISTORY OF PRESENT ILLNESS: Michele [name]  is a 83 y.o. female with a known history of OA,Essential hypertension, GERD and hypothyroidism who is presenting to the emergency room with complaint of substernal chest pain that started earlier this morning. Patient reports that she's been having dyspnea on exertion ongoing for the past few months. Progressively getting worse with activity. She also has occasional chest pain but this time the pain persisted and her shortness of breath persisted. She came to the emergency room her cardiac enzymes were negative. However her d-dimer was  elevated. Therefore she underwent a CT scan of the chest per pulmonary embolism which was negative for pulmonary embolism. Currently is chest pain-free  HOSPITAL COURSE:   * Atypical chest pain. Patient was admitted to telemetry floor. Troponin 3 was normal. EKG showed nothing acute. No arrhythmias on telemetry. Stress test was low risk study other than a little small abnormality with her breast shadow. She had very atypical symptoms. Seemed more like GERD. Started on PPIs. Prior to discharge patient had no further pain. Ambulated in the hallway without any problems.  Stable For discharge home.  PCP follow-up in one week.   CONSULTS OBTAINED:    DRUG ALLERGIES:   Allergies  Allergen Reactions  . Aspirin Other (See Comments)    Patient collapsed, was rushed to the hospital and hospitalized for a week.  . Codeine Nausea And Vomiting  . Morphine And Related Nausea And Vomiting  . Other Nausea And Vomiting    AGENT:  anesthesia  . Scallops [Shellfish Allergy] Nausea And Vomiting    Headaches  . Sulfa Antibiotics Hives    DISCHARGE MEDICATIONS:   Discharge Medication List as of 09/11/2016  6:03 PM    START taking these medications   Details  pantoprazole (PROTONIX) 40 MG tablet Take 1 tablet (40 mg total) by mouth daily before breakfast., Starting Tue 09/11/2016,  Normal      CONTINUE these medications which have NOT CHANGED   Details  clopidogrel (PLAVIX) 75 MG tablet Take 75 mg by mouth at bedtime., Until Discontinued, Historical Med    levothyroxine (SYNTHROID, LEVOTHROID) 112 MCG tablet Take 112 mcg by mouth at bedtime., Until Discontinued, Historical Med    lisinopril (PRINIVIL,ZESTRIL) 5 MG tablet Take 5 mg by mouth at bedtime., Until Discontinued, Historical Med    Multiple Vitamins-Minerals (PRESERVISION AREDS 2 PO) Take 1 capsule by mouth 2 (two) times daily., Historical Med    rosuvastatin (CRESTOR) 5 MG tablet Take 5 mg by mouth at bedtime., Until Discontinued,  Historical Med    docusate sodium 100 MG CAPS Take 100 mg by mouth 2 (two) times daily., Starting Mon 02/09/2013, Normal    meloxicam (MOBIC) 7.5 MG tablet Take 1 tablet (7.5 mg total) by mouth daily., Starting Thu 12/09/2014, Print    ondansetron (ZOFRAN) 4 MG tablet Take 1 tablet (4 mg total) by mouth every 8 (eight) hours as needed for nausea or vomiting., Starting Fri 07/22/2015, Print        Today   VITAL SIGNS:  Blood pressure (!) 122/43, pulse 80, temperature 98 F (36.7 C), resp. rate 16, height 5\' 4"  (1.626 m), weight 64.7 kg (142 lb 9.6 oz), SpO2 95 %.  I/O:  No intake or output data in the 24 hours ending 09/13/16 1343  PHYSICAL EXAMINATION:  Physical Exam  GENERAL:  83 y.o.-year-old patient lying in the bed with no acute distress.  LUNGS: Normal breath sounds bilaterally, no wheezing, rales,rhonchi or crepitation. No use of accessory muscles of respiration.  CARDIOVASCULAR: S1, S2 normal. No murmurs, rubs, or gallops.  ABDOMEN: Soft, non-tender, non-distended. Bowel sounds present. No organomegaly or mass.  NEUROLOGIC: Moves all 4 extremities. PSYCHIATRIC: The patient is alert and oriented x 3.  SKIN: No obvious rash, lesion, or ulcer.   DATA REVIEW:   CBC  Recent Labs Lab 09/11/16 0520  WBC 6.8  HGB 10.7*  HCT 31.8*  PLT 176    Chemistries   Recent Labs Lab 09/10/16 1515 09/11/16 0520  NA 138 140  K 4.5 4.6  CL 107 107  CO2 25 27  GLUCOSE 146* 94  BUN 31* 23*  CREATININE 1.06* 1.15*  CALCIUM 9.4 8.9  AST 28  --   ALT 16  --   ALKPHOS 55  --   BILITOT 0.4  --     Cardiac Enzymes  Recent Labs Lab 09/11/16 0520  TROPONINI <0.03    Microbiology Results  Results for orders placed or performed during the hospital encounter of 07/22/15  Rapid Influenza A&B Antigens ([place] only)     Status: None   Collection Time: 07/22/15  6:07 PM  Result Value Ref Range Status   Influenza A ([facility]) NEGATIVE NEGATIVE Final   Influenza B ([facility]) NEGATIVE  NEGATIVE Final    RADIOLOGY:  No results found.  Follow up with PCP in 1 week.  Management plans discussed with the patient, family and they are in agreement.  CODE STATUS:  Code Status History    Date Active Date Inactive Code Status Order ID Comments User Context   09/10/2016  7:45 PM 09/12/2016 12:07 AM Full Code [id number]  Dustin Flock, MD Inpatient   02/05/2013  5:37 PM 02/09/2013  4:20 PM Full Code [id number]  Newman Pies, MD Inpatient    Advance Directive Documentation     Most Recent Value  Type of Advance Directive  Living will,  Healthcare Power of Attorney  Pre-existing out of facility DNR order (yellow form or pink MOST form)  -  "MOST" Form in Place?  -      TOTAL TIME TAKING CARE OF THIS PATIENT ON DAY OF DISCHARGE: more than 30 minutes.   Hillary Bow R M.D on 09/13/2016 at 1:43 PM  Between 7am to 6pm - Pager - [phone]  After 6pm go to [email or web address] - password [email or web address] [facility]  Office  [phone]  CC: Primary care physician; BABAOFF, Caryl Bis, MD  Note: This dictation was prepared with Dragon dictation along with smaller phrase technology. Any transcriptional errors that result from this process are unintentional.

## 2016-09-18 DIAGNOSIS — R079 Chest pain, unspecified: Secondary | ICD-10-CM | POA: Diagnosis not present

## 2016-12-07 DIAGNOSIS — H353132 Nonexudative age-related macular degeneration, bilateral, intermediate dry stage: Secondary | ICD-10-CM | POA: Diagnosis not present

## 2016-12-21 DIAGNOSIS — E7800 Pure hypercholesterolemia, unspecified: Secondary | ICD-10-CM | POA: Diagnosis not present

## 2016-12-21 DIAGNOSIS — E039 Hypothyroidism, unspecified: Secondary | ICD-10-CM | POA: Diagnosis not present

## 2016-12-21 DIAGNOSIS — Z79899 Other long term (current) drug therapy: Secondary | ICD-10-CM | POA: Diagnosis not present

## 2016-12-28 DIAGNOSIS — K219 Gastro-esophageal reflux disease without esophagitis: Secondary | ICD-10-CM | POA: Diagnosis not present

## 2016-12-28 DIAGNOSIS — E7800 Pure hypercholesterolemia, unspecified: Secondary | ICD-10-CM | POA: Diagnosis not present

## 2016-12-28 DIAGNOSIS — E039 Hypothyroidism, unspecified: Secondary | ICD-10-CM | POA: Diagnosis not present

## 2016-12-28 DIAGNOSIS — D649 Anemia, unspecified: Secondary | ICD-10-CM | POA: Diagnosis not present

## 2016-12-28 DIAGNOSIS — I10 Essential (primary) hypertension: Secondary | ICD-10-CM | POA: Diagnosis not present

## 2016-12-28 DIAGNOSIS — Z79899 Other long term (current) drug therapy: Secondary | ICD-10-CM | POA: Diagnosis not present

## 2017-03-27 DIAGNOSIS — D2262 Melanocytic nevi of left upper limb, including shoulder: Secondary | ICD-10-CM | POA: Diagnosis not present

## 2017-03-27 DIAGNOSIS — D2272 Melanocytic nevi of left lower limb, including hip: Secondary | ICD-10-CM | POA: Diagnosis not present

## 2017-03-27 DIAGNOSIS — L821 Other seborrheic keratosis: Secondary | ICD-10-CM | POA: Diagnosis not present

## 2017-03-27 DIAGNOSIS — D225 Melanocytic nevi of trunk: Secondary | ICD-10-CM | POA: Diagnosis not present

## 2017-06-07 DIAGNOSIS — H353132 Nonexudative age-related macular degeneration, bilateral, intermediate dry stage: Secondary | ICD-10-CM | POA: Diagnosis not present

## 2017-06-24 DIAGNOSIS — E7800 Pure hypercholesterolemia, unspecified: Secondary | ICD-10-CM | POA: Diagnosis not present

## 2017-06-24 DIAGNOSIS — Z79899 Other long term (current) drug therapy: Secondary | ICD-10-CM | POA: Diagnosis not present

## 2017-06-24 DIAGNOSIS — D649 Anemia, unspecified: Secondary | ICD-10-CM | POA: Diagnosis not present

## 2017-06-24 DIAGNOSIS — E039 Hypothyroidism, unspecified: Secondary | ICD-10-CM | POA: Diagnosis not present

## 2017-07-01 DIAGNOSIS — I10 Essential (primary) hypertension: Secondary | ICD-10-CM | POA: Diagnosis not present

## 2017-07-01 DIAGNOSIS — E039 Hypothyroidism, unspecified: Secondary | ICD-10-CM | POA: Diagnosis not present

## 2017-07-01 DIAGNOSIS — Z0000 Encounter for general adult medical examination without abnormal findings: Secondary | ICD-10-CM | POA: Diagnosis not present

## 2017-07-01 DIAGNOSIS — Z79899 Other long term (current) drug therapy: Secondary | ICD-10-CM | POA: Diagnosis not present

## 2017-07-01 DIAGNOSIS — E7800 Pure hypercholesterolemia, unspecified: Secondary | ICD-10-CM | POA: Diagnosis not present

## 2017-12-19 DIAGNOSIS — E039 Hypothyroidism, unspecified: Secondary | ICD-10-CM | POA: Diagnosis not present

## 2017-12-19 DIAGNOSIS — E7800 Pure hypercholesterolemia, unspecified: Secondary | ICD-10-CM | POA: Diagnosis not present

## 2017-12-19 DIAGNOSIS — Z79899 Other long term (current) drug therapy: Secondary | ICD-10-CM | POA: Diagnosis not present

## 2017-12-26 DIAGNOSIS — H353132 Nonexudative age-related macular degeneration, bilateral, intermediate dry stage: Secondary | ICD-10-CM | POA: Diagnosis not present

## 2017-12-30 DIAGNOSIS — I6529 Occlusion and stenosis of unspecified carotid artery: Secondary | ICD-10-CM | POA: Diagnosis not present

## 2017-12-30 DIAGNOSIS — E039 Hypothyroidism, unspecified: Secondary | ICD-10-CM | POA: Diagnosis not present

## 2017-12-30 DIAGNOSIS — I10 Essential (primary) hypertension: Secondary | ICD-10-CM | POA: Diagnosis not present

## 2017-12-30 DIAGNOSIS — E7800 Pure hypercholesterolemia, unspecified: Secondary | ICD-10-CM | POA: Diagnosis not present

## 2017-12-30 DIAGNOSIS — N183 Chronic kidney disease, stage 3 (moderate): Secondary | ICD-10-CM | POA: Diagnosis not present

## 2018-10-21 ENCOUNTER — Emergency Department: Payer: Medicare HMO

## 2018-10-21 ENCOUNTER — Emergency Department
Admission: EM | Admit: 2018-10-21 | Discharge: 2018-10-21 | Disposition: A | Discharge status: Home / Self Care | Payer: Medicare HMO | Attending: Emergency Medicine | Admitting: Emergency Medicine

## 2018-10-21 ENCOUNTER — Other Ambulatory Visit: Payer: Self-pay

## 2018-10-21 DIAGNOSIS — E039 Hypothyroidism, unspecified: Secondary | ICD-10-CM | POA: Insufficient documentation

## 2018-10-21 DIAGNOSIS — Z7902 Long term (current) use of antithrombotics/antiplatelets: Secondary | ICD-10-CM | POA: Diagnosis not present

## 2018-10-21 DIAGNOSIS — Z1159 Encounter for screening for other viral diseases: Secondary | ICD-10-CM | POA: Insufficient documentation

## 2018-10-21 DIAGNOSIS — N390 Urinary tract infection, site not specified: Secondary | ICD-10-CM | POA: Diagnosis not present

## 2018-10-21 DIAGNOSIS — I10 Essential (primary) hypertension: Secondary | ICD-10-CM | POA: Insufficient documentation

## 2018-10-21 DIAGNOSIS — R1030 Lower abdominal pain, unspecified: Secondary | ICD-10-CM | POA: Diagnosis present

## 2018-10-21 DIAGNOSIS — Z79899 Other long term (current) drug therapy: Secondary | ICD-10-CM | POA: Insufficient documentation

## 2018-10-21 LAB — CBC
HCT: 36.3 % (ref 36.0–46.0)
Hemoglobin: 12.1 g/dL (ref 12.0–15.0)
MCH: 31.3 pg (ref 26.0–34.0)
MCHC: 33.3 g/dL (ref 30.0–36.0)
MCV: 93.8 fL (ref 80.0–100.0)
Platelets: 193 10*3/uL (ref 150–400)
RBC: 3.87 MIL/uL (ref 3.87–5.11)
RDW: 12.3 % (ref 11.5–15.5)
WBC: 12.5 10*3/uL — ABNORMAL HIGH (ref 4.0–10.5)
nRBC: 0 % (ref 0.0–0.2)

## 2018-10-21 LAB — COMPREHENSIVE METABOLIC PANEL
ALT: 21 U/L (ref 0–44)
AST: 27 U/L (ref 15–41)
Albumin: 4.3 g/dL (ref 3.5–5.0)
Alkaline Phosphatase: 49 U/L (ref 38–126)
Anion gap: 9 (ref 5–15)
BUN: 21 mg/dL (ref 8–23)
CO2: 26 mmol/L (ref 22–32)
Calcium: 9.2 mg/dL (ref 8.9–10.3)
Chloride: 103 mmol/L (ref 98–111)
Creatinine, Ser: 1.15 mg/dL — ABNORMAL HIGH (ref 0.44–1.00)
GFR calc Af Amer: 49 mL/min — ABNORMAL LOW (ref >60)
GFR calc non Af Amer: 42 mL/min — ABNORMAL LOW (ref >60)
Glucose, Bld: 148 mg/dL — ABNORMAL HIGH (ref 70–99)
Potassium: 4.6 mmol/L (ref 3.5–5.1)
Sodium: 138 mmol/L (ref 135–145)
Total Bilirubin: 0.9 mg/dL (ref 0.3–1.2)
Total Protein: 7.1 g/dL (ref 6.5–8.1)

## 2018-10-21 LAB — LIPASE, BLOOD: Lipase: 41 U/L (ref 11–51)

## 2018-10-21 LAB — SARS CORONAVIRUS 2 BY RT PCR (HOSPITAL ORDER, PERFORMED IN ~~LOC~~ HOSPITAL LAB): SARS Coronavirus 2: NEGATIVE

## 2018-10-21 LAB — URINALYSIS, COMPLETE (UACMP) WITH MICROSCOPIC
Bilirubin Urine: NEGATIVE
Glucose, UA: NEGATIVE mg/dL
Hgb urine dipstick: NEGATIVE
Ketones, ur: 20 mg/dL — AB
Leukocytes,Ua: NEGATIVE
Nitrite: POSITIVE — AB
Protein, ur: 30 mg/dL — AB
Specific Gravity, Urine: 1.028 (ref 1.005–1.030)
pH: 5 (ref 5.0–8.0)

## 2018-10-21 IMAGING — CT CT ABDOMEN AND PELVIS WITH CONTRAST
2 of 5 series · 16 of 46 positions shown, 18 images · IV contrast (APPLIED)
Comparison: CT scan of [DATE]. Radiographs [DATE].

CLINICAL DATA: Acute lower abdominal pain.

EXAM:
CT ABDOMEN AND PELVIS WITH CONTRAST
TECHNIQUE: Multidetector CT imaging of the abdomen and pelvis was performed
using the standard protocol following bolus administration of
intravenous contrast.
CONTRAST:  75mL OMNIPAQUE IOHEXOL 300 MG/ML  SOLN

[Series 2: axial (person_name) (person_name) · axial · 0.79mm/px · z∈[-461,-66]mm · 13 of 89 slices shown, 15 images]
[im 5/89  soft-tissue]
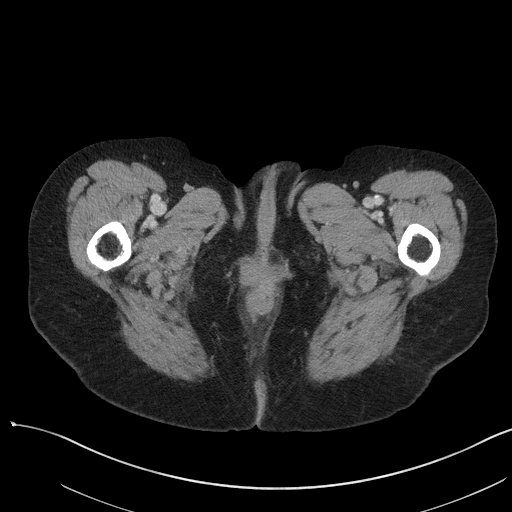
[im 5/89  bone]
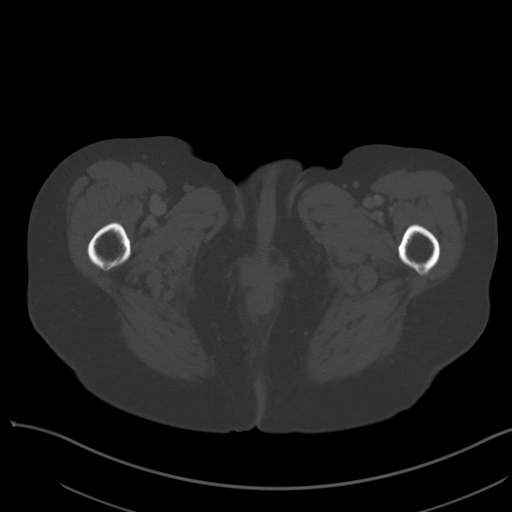
[im 10/89  soft-tissue]
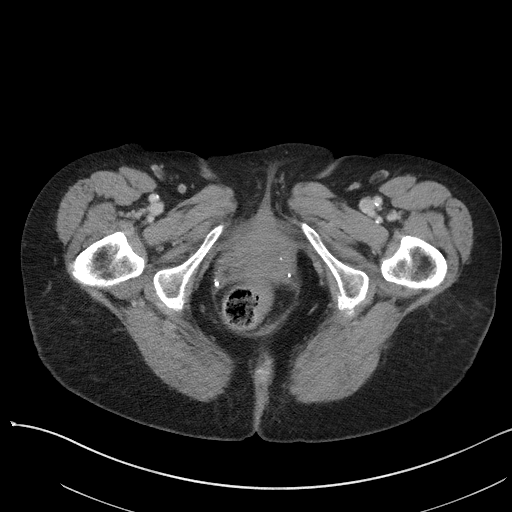
[im 20/89  soft-tissue]
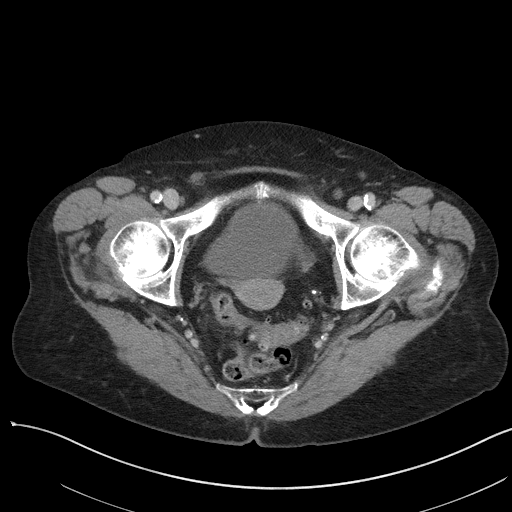
[im 25/89  soft-tissue]
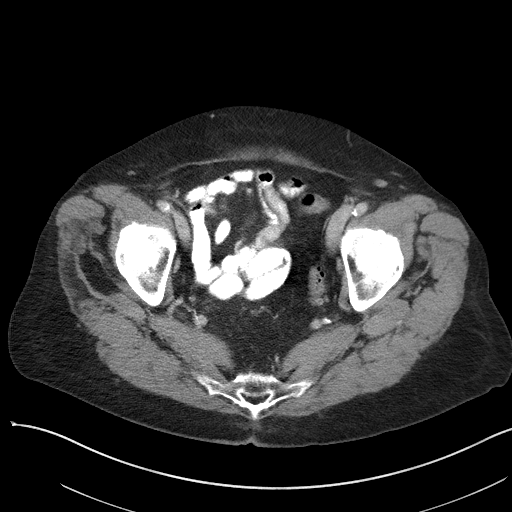
[im 30/89  soft-tissue]
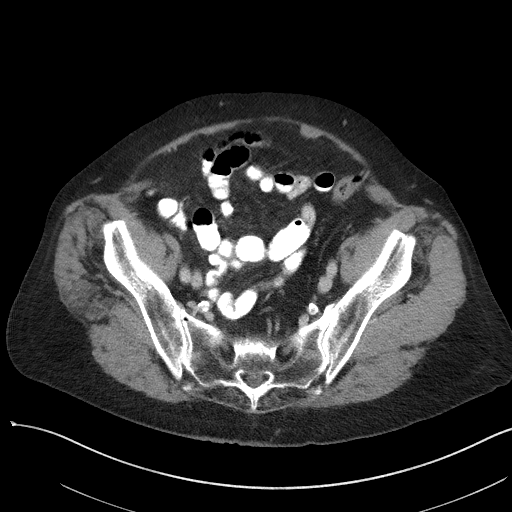
[im 40/89  soft-tissue]
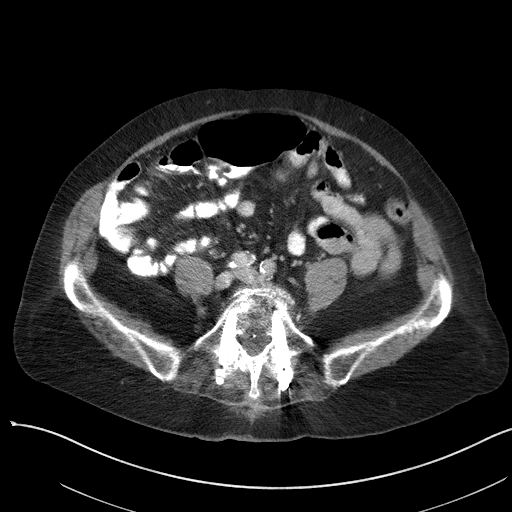
[im 45/89  soft-tissue]
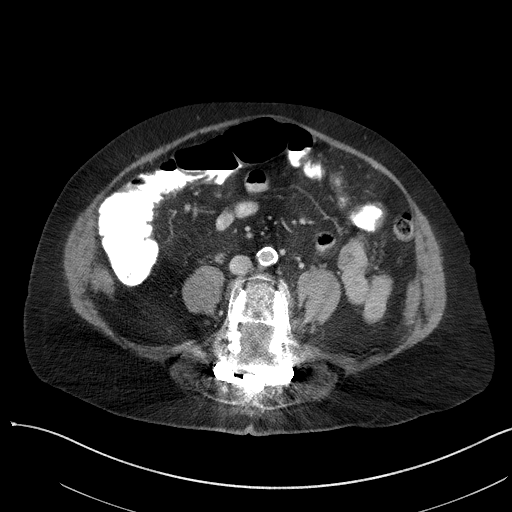
[im 49/89  soft-tissue]
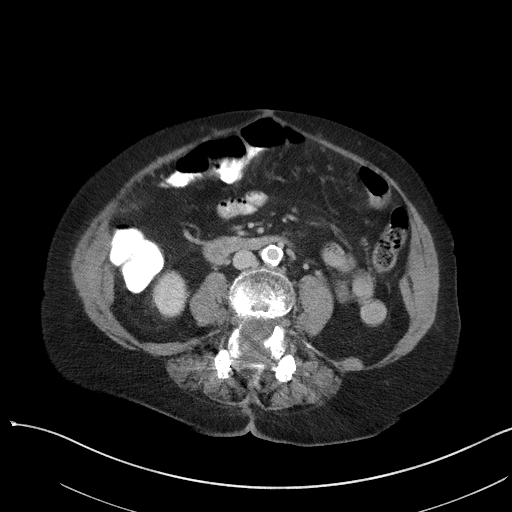
[im 59/89  soft-tissue]
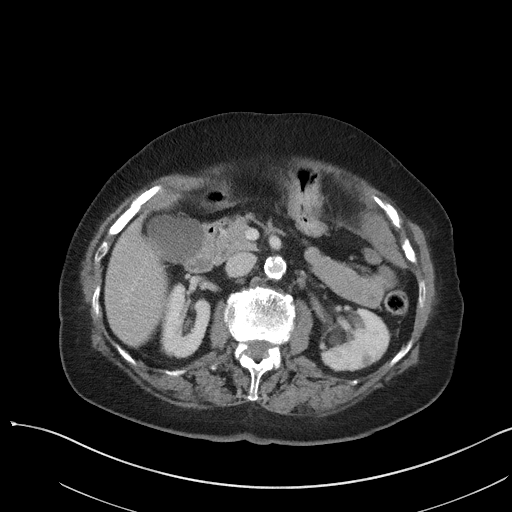
[im 59/89  bone]
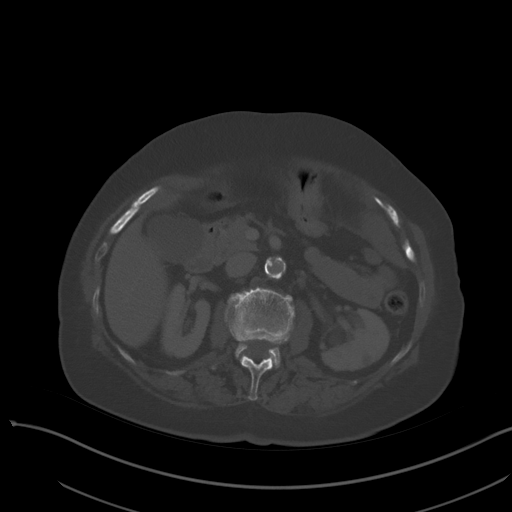
[im 64/89  soft-tissue]
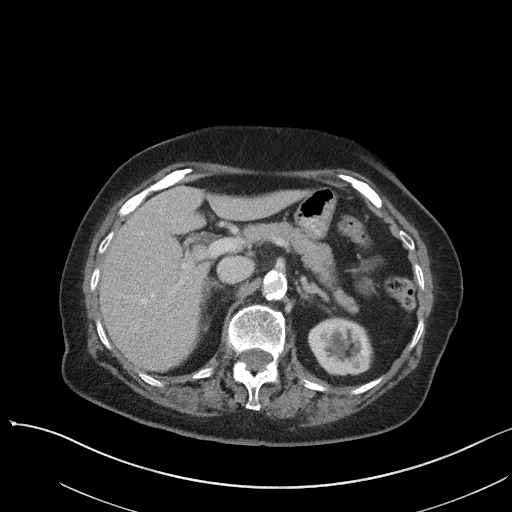
[im 69/89  soft-tissue]
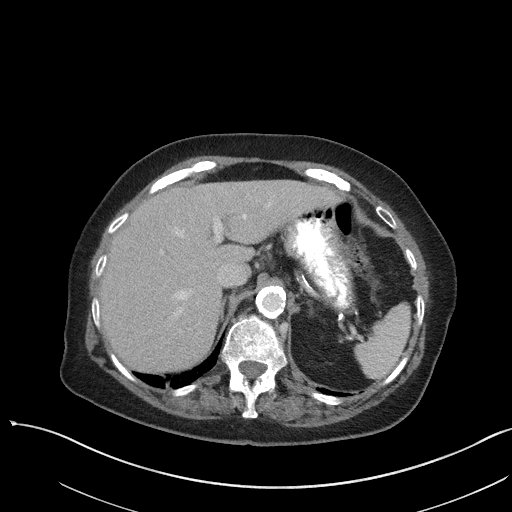
[im 79/89  soft-tissue]
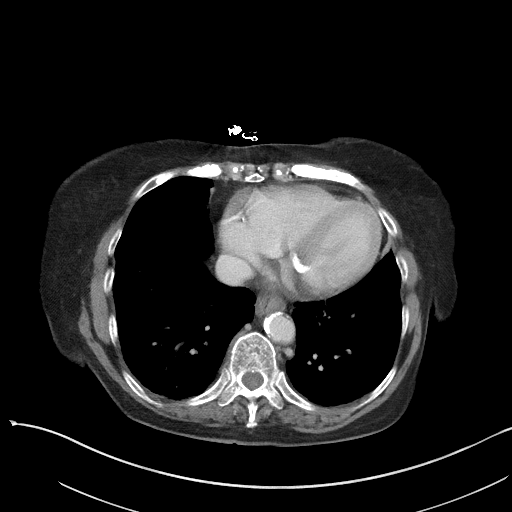
[im 84/89  soft-tissue]
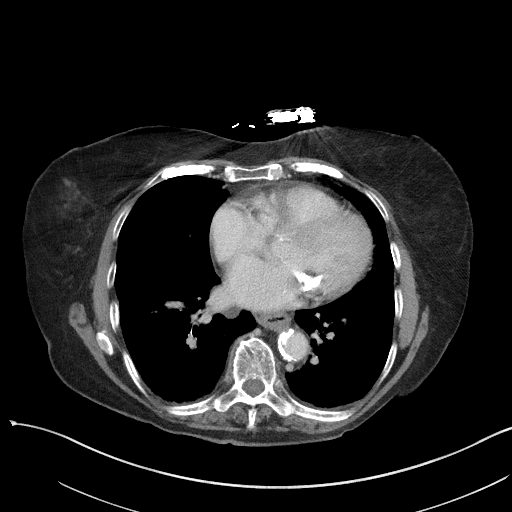

[Series 5: coronal st · coronal · 0.68mm/px · 3 of 85 slices shown]
[im 29/85  soft-tissue]
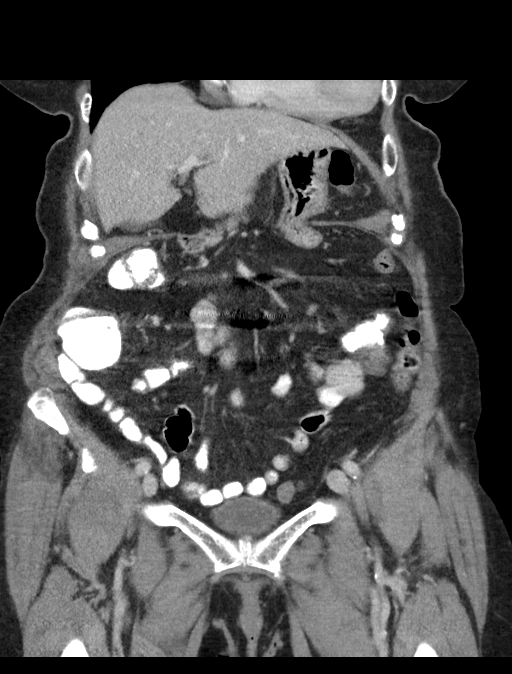
[im 38/85  soft-tissue]
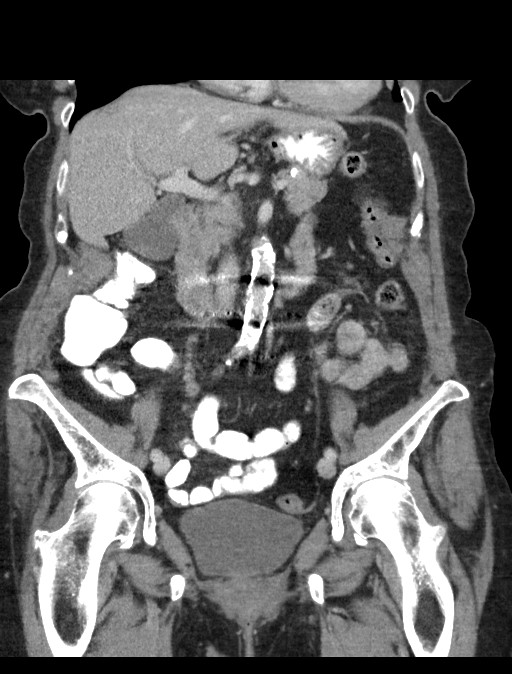
[im 47/85  soft-tissue]
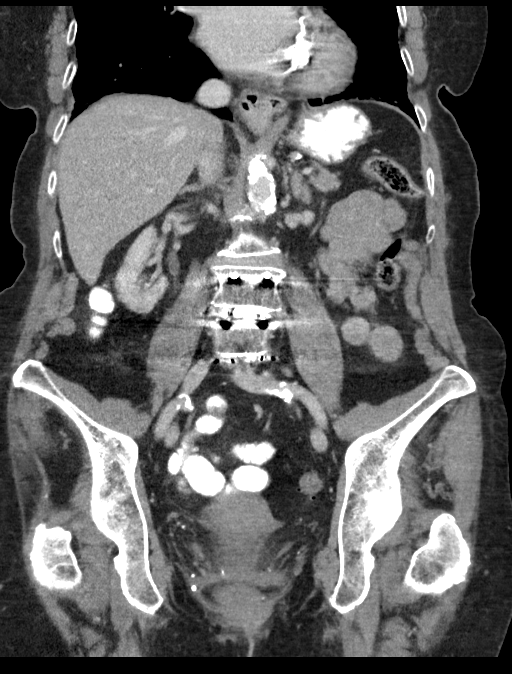

[16 of 46 positions shown; findings below may reference images not displayed]

FINDINGS: Lower chest: Small sliding-type hiatal hernia is noted. Visualized
lung bases are unremarkable.

Hepatobiliary: No focal liver abnormality is seen. No gallstones,
gallbladder wall thickening, or biliary dilatation.

Pancreas: Unremarkable. No pancreatic ductal dilatation or
surrounding inflammatory changes.

Spleen: Normal in size without focal abnormality.

Adrenals/Urinary Tract: Adrenal glands appear normal. Parapelvic
cysts are seen on the left. No hydronephrosis or renal obstruction
is noted. No renal or ureteral calculi are noted. Urinary bladder is
unremarkable.

Stomach/Bowel: Stomach is within normal limits. Appendix appears
normal. No evidence of bowel wall thickening, distention, or
inflammatory changes. Sigmoid diverticulosis is noted without
inflammation.

Vascular/Lymphatic: Aortic atherosclerosis. No enlarged abdominal or
pelvic lymph nodes.

Reproductive: Uterus and bilateral adnexa are unremarkable.

Other: No abdominal wall hernia or abnormality. No abdominopelvic
ascites.

Musculoskeletal: Moderate compression deformity of T12 vertebral
body is noted concerning for fracture of indeterminate age. Status
post surgical posterior fusion of L3-4 and L4-5.
IMPRESSION: Moderate compression deformity of T12 vertebral body is noted
concerning for fracture of indeterminate age. MRI may be performed
for further evaluation.

Small sliding-type hiatal hernia.

Sigmoid diverticulosis without inflammation.

Aortic Atherosclerosis ([HY]-[HY]).

## 2018-10-21 MED ORDER — SODIUM CHLORIDE 0.9 % IV SOLN
1.0000 g | Freq: Once | INTRAVENOUS | Status: AC
  Administered 2018-10-21: 1 g via INTRAVENOUS
  Filled 2018-10-21: qty 10

## 2018-10-21 MED ORDER — ACETAMINOPHEN 500 MG PO TABS
1000.0000 mg | ORAL_TABLET | Freq: Once | ORAL | Status: AC
  Administered 2018-10-21: 1000 mg via ORAL
  Filled 2018-10-21: qty 2

## 2018-10-21 MED ORDER — SODIUM CHLORIDE 0.9 % IV BOLUS
1000.0000 mL | Freq: Once | INTRAVENOUS | Status: AC
  Administered 2018-10-21: 1000 mL via INTRAVENOUS

## 2018-10-21 MED ORDER — SODIUM CHLORIDE 0.9% FLUSH
3.0000 mL | Freq: Once | INTRAVENOUS | Status: AC
  Administered 2018-10-21: 3 mL via INTRAVENOUS

## 2018-10-21 MED ORDER — ONDANSETRON HCL 4 MG/2ML IJ SOLN
4.0000 mg | Freq: Once | INTRAMUSCULAR | Status: AC
Start: 2018-10-21 — End: 2018-10-21
  Administered 2018-10-21: 4 mg via INTRAVENOUS
  Filled 2018-10-21: qty 2

## 2018-10-21 MED ORDER — IOHEXOL 300 MG/ML  SOLN
75.0000 mL | Freq: Once | INTRAMUSCULAR | Status: AC | PRN
  Administered 2018-10-21: 75 mL via INTRAVENOUS

## 2018-10-21 MED ORDER — IOHEXOL 240 MG/ML SOLN
50.0000 mL | Freq: Once | INTRAMUSCULAR | Status: AC | PRN
  Administered 2018-10-21: 50 mL via ORAL

## 2018-10-21 MED ORDER — CEPHALEXIN 250 MG PO CAPS: 250.0000 mg | ORAL_CAPSULE | Freq: Two times a day (BID) | ORAL | 0 refills | Status: DC

## 2018-10-21 MED ORDER — CEPHALEXIN 250 MG PO CAPS: 250.0000 mg | ORAL_CAPSULE | Freq: Two times a day (BID) | ORAL | 0 refills | Status: AC

## 2018-10-21 NOTE — ED Notes (Signed)
Patient transported to CT 

## 2018-10-21 NOTE — ED Provider Notes (Signed)
[facility] Emergency Department Provider Note  Time seen: 6:43 PM  I have reviewed the triage vital signs and the nursing notes.   HISTORY  Chief Complaint Abdominal Pain   HPI Michele [name] is a 85 y.o. female with a past medical history of anemia, back pain, gastric reflux, hypertension, hyperlipidemia, presents to the emergency department for lower abdominal pain.  According to the patient over the past 2 days she has been experiencing lower abdominal pain, yesterday she was very nauseated and ate very little.  Her last bowel movement was yesterday morning which is somewhat abnormal did notice some abdominal distention last night and today.  Denies any dysuria.  Denies any actual vomiting.  Denies any diarrhea.  No fever cough congestion or shortness of breath.   Past Medical History:  Diagnosis Date  . Anemia    yrs ago  . Arthritis   . Back pain   . Carotid bruit    right side more so than left;but per pt not enough to clean out  . Constipation    metamucil bid  . Diverticulosis    right  . GERD (gastroesophageal reflux disease)    related to some meds;but doesn't take any medications  . History of migraine    stopped with menopause  . Hyperlipidemia    takes crestor daily  . Hypertension    takes Lisinopril daily  . Hypothyroidism    takes Synthroid daily  . Internal hemorrhoids   . Joint pain   . Joint swelling   . Macular degeneration    dry  . Nocturia   . Numbness    left foot  . Osteopenia   . Phlebitis    hx of-98yrs ago right leg;takes Plavix daily but on hold for surgery  . PONV (postoperative nausea and vomiting)   . Urinary frequency   . Urinary urgency     Patient Active Problem List   Diagnosis Date Noted  . Chest pain 09/10/2016    Past Surgical History:  Procedure Laterality Date  . BREAST BIOPSY Right 12/1997   neg  . BREAST CYST ASPIRATION Right 06/21/1954   rt fna- milk duct-neg  . COLONOSCOPY    .  DILATION AND CURETTAGE OF UTERUS    . OOPHORECTOMY Bilateral   . removal of breast cyst  1998  . removal of neck cyst  1996  . removal of tubes and ovaries      Prior to Admission medications   Medication Sig Start Date End Date Taking? Authorizing Provider  clopidogrel (PLAVIX) 75 MG tablet Take 75 mg by mouth at bedtime.    [provider]  docusate sodium 100 MG CAPS Take 100 mg by mouth 2 (two) times daily. Patient not taking: Reported on 08/05/2015 02/09/13   Newman Pies, MD  levothyroxine (SYNTHROID, LEVOTHROID) 112 MCG tablet Take 112 mcg by mouth at bedtime.    [provider]  lisinopril (PRINIVIL,ZESTRIL) 5 MG tablet Take 5 mg by mouth at bedtime.    [provider]  meloxicam (MOBIC) 7.5 MG tablet Take 1 tablet (7.5 mg total) by mouth daily. Patient not taking: Reported on 08/05/2015 12/09/14   Lorin Picket, PA-C  Multiple Vitamins-Minerals (PRESERVISION AREDS 2 PO) Take 1 capsule by mouth 2 (two) times daily.    [provider]  ondansetron (ZOFRAN) 4 MG tablet Take 1 tablet (4 mg total) by mouth every 8 (eight) hours as needed for nausea or vomiting. Patient not taking: Reported  on 08/05/2015 07/22/15   Nance Pear, MD  pantoprazole (PROTONIX) 40 MG tablet Take 1 tablet (40 mg total) by mouth daily before breakfast. 09/11/16   Hillary Bow, MD  rosuvastatin (CRESTOR) 5 MG tablet Take 5 mg by mouth at bedtime.    [provider]    Allergies  Allergen Reactions  . Aspirin Other (See Comments)    Patient collapsed, was rushed to the hospital and hospitalized for a week.  . Codeine Nausea And Vomiting  . Morphine And Related Nausea And Vomiting  . Other Nausea And Vomiting    AGENT:  anesthesia  . Scallops [Shellfish Allergy] Nausea And Vomiting    Headaches  . Sulfa Antibiotics Hives    Family History  Problem Relation Age of Onset  . Heart failure Mother   . Heart failure Father   . Cancer Sister   . Breast  cancer Sister 68  . Cancer Brother   . Breast cancer Cousin        3 pat cousins    Social History Social History   Tobacco Use  . Smoking status: Never Smoker  . Smokeless tobacco: Never Used  Substance Use Topics  . Alcohol use: No  . Drug use: No    Review of Systems Constitutional: Negative for fever. ENT: Negative for recent illness/congestion Cardiovascular: Negative for chest pain. Respiratory: Negative for shortness of breath.  Negative for cough. Gastrointestinal: Positive for lower abdominal pain.  Positive for nausea but negative for vomiting or diarrhea.  Last bowel movement yesterday morning. Genitourinary: Negative for urinary compaints Musculoskeletal: Negative for musculoskeletal complaints Skin: Negative for skin complaints  Neurological: Negative for headache All other ROS negative  ____________________________________________   PHYSICAL EXAM:  VITAL SIGNS: ED Triage Vitals  Enc Vitals Group     BP 10/21/18 1626 (!) 143/46     Pulse Rate 10/21/18 1625 90     Resp 10/21/18 1625 18     Temp 10/21/18 1625 97.7 F (36.5 C)     Temp Source 10/21/18 1625 Oral     SpO2 10/21/18 1625 99 %     Weight 10/21/18 1626 142 lb (64.4 kg)     Height 10/21/18 1626 5\' 5"  (1.651 m)     Head Circumference --      Peak Flow --      Pain Score 10/21/18 1626 5     Pain Loc --      Pain Edu? --      Excl. in [place]? --     Constitutional: Alert and oriented. Well appearing and in no distress. Eyes: Normal exam ENT      Head: Normocephalic and atraumatic.      Mouth/Throat: Mucous membranes are moist. Cardiovascular: Normal rate, regular rhythm Respiratory: Normal respiratory effort without tachypnea nor retractions. Breath sounds are clear Gastrointestinal: Soft, mild to moderate tenderness palpation across the lower abdomen with mild distention.  No rebound or guarding.  Tympanic percussion.  No upper abdominal tenderness. Musculoskeletal: Nontender with normal  range of motion in all extremities. No lower extremity tenderness or edema. Neurologic:  Normal speech and language. No gross focal neurologic deficits  Skin:  Skin is warm, dry and intact.  Psychiatric: Mood and affect are normal.  ____________________________________________    EKG  EKG viewed and interpreted by myself shows a normal sinus rhythm at 68 bpm with a narrow QRS, mild right axis deviation, largely normal intervals besides slight PR prolongation.  No concerning ST changes.  ____________________________________________  RADIOLOGY  CT negative for acute abnormality  ____________________________________________   INITIAL IMPRESSION / ASSESSMENT AND PLAN / ED COURSE  Pertinent labs & imaging results that were available during my care of the patient were reviewed by me and considered in my medical decision making (see chart for details).   Patient presents emergency department for lower abdominal pain with mild distention and nausea.  Patient does have mild distention with tympanic percussion with mild to moderate tenderness across the lower abdomen.  Differential would include urinary tract infection, ileus, SBO, volvulus, colitis or diverticulitis less likely free air.  Patient's lab work shows a very slight leukocytosis otherwise largely within normal limit lab work.  We will obtain a CT scan to further evaluate.  Patient agreeable to plan of care.  CT scan largely negative for acute abnormality.  Patient's lab work has resulted showing a urinary tract infection.  We will dose IV Rocephin.  Patient states she is feeling better, has received Tylenol for discomfort.  I discussed with the patient continued oral antibiotics and Tylenol as needed.  Patient agreeable to plan of care.  Keegan Bensch was evaluated in Emergency Department on 10/21/2018 for the symptoms described in the history of present illness. She was evaluated in the context of the global COVID-19  pandemic, which necessitated consideration that the patient might be at risk for infection with the SARS-CoV-2 virus that causes COVID-19. Institutional protocols and algorithms that pertain to the evaluation of patients at risk for COVID-19 are in a state of rapid change based on information released by regulatory bodies including the CDC and federal and state organizations. These policies and algorithms were followed during the patient's care in the ED.  ____________________________________________   FINAL CLINICAL IMPRESSION(S) / ED DIAGNOSES  Abdominal pain Urinary tract infection   Harvest Dark, MD 10/21/18 2244

## 2018-10-21 NOTE — ED Notes (Signed)
This RN assisting pt to bedside toilet. Urine sample will be sent to lab.

## 2018-10-21 NOTE — ED Notes (Signed)
Offered to turn lights off for pt but pt declined again.

## 2018-10-21 NOTE — ED Triage Notes (Signed)
Pt c/o severe lower abd pain since yesterday with nausea. Denies emesis, diarrhea or constipations.

## 2018-10-21 NOTE — ED Notes (Signed)
Responded to call bell. Pt stated she needed to urinate. Pt placed on bedpan. After approx 5 minutes she wasn't sure if she had gone but she felt like she was done. There was no urine in the bedpan. Peri care provided, Pt resting.

## 2018-10-21 NOTE — ED Notes (Signed)
Pt hurting in abd/back but continues to refuse pain meds.

## 2018-10-21 NOTE — ED Notes (Signed)
Pt's daughter Mickel Baas given brief update. Requested to also speak with pt directly & pt agreed; phone given to pt.

## 2018-10-21 NOTE — ED Notes (Signed)
Daughter Mickel Baas called to notify of pt's d/c orders. Mickel Baas states she will be here in about 105mins.

## 2018-10-21 NOTE — ED Notes (Signed)
Pt wheeled to lobby.

## 2018-10-21 NOTE — ED Notes (Signed)
Pt's daughter Mickel Baas updated at [phone] as pt gave verbal okay to update her.

## 2018-10-22 ENCOUNTER — Encounter
Admission: RE | Admit: 2018-10-22 | Discharge: 2018-10-22 | Disposition: A | Discharge status: Home / Self Care | Payer: Medicare HMO | Source: Ambulatory Visit | Attending: Internal Medicine | Admitting: Internal Medicine

## 2018-10-22 ENCOUNTER — Emergency Department
Admission: EM | Admit: 2018-10-22 | Discharge: 2018-10-22 | Disposition: A | Discharge status: Home / Self Care | Payer: Medicare HMO | Attending: Emergency Medicine | Admitting: Emergency Medicine

## 2018-10-22 ENCOUNTER — Emergency Department: Payer: Medicare HMO

## 2018-10-22 ENCOUNTER — Other Ambulatory Visit: Payer: Self-pay

## 2018-10-22 DIAGNOSIS — Y929 Unspecified place or not applicable: Secondary | ICD-10-CM | POA: Insufficient documentation

## 2018-10-22 DIAGNOSIS — I10 Essential (primary) hypertension: Secondary | ICD-10-CM | POA: Diagnosis not present

## 2018-10-22 DIAGNOSIS — S299XXA Unspecified injury of thorax, initial encounter: Secondary | ICD-10-CM | POA: Diagnosis present

## 2018-10-22 DIAGNOSIS — E039 Hypothyroidism, unspecified: Secondary | ICD-10-CM | POA: Insufficient documentation

## 2018-10-22 DIAGNOSIS — D649 Anemia, unspecified: Secondary | ICD-10-CM | POA: Insufficient documentation

## 2018-10-22 DIAGNOSIS — Y999 Unspecified external cause status: Secondary | ICD-10-CM | POA: Insufficient documentation

## 2018-10-22 DIAGNOSIS — M549 Dorsalgia, unspecified: Secondary | ICD-10-CM | POA: Insufficient documentation

## 2018-10-22 DIAGNOSIS — Y33XXXA Other specified events, undetermined intent, initial encounter: Secondary | ICD-10-CM | POA: Diagnosis not present

## 2018-10-22 DIAGNOSIS — S22080A Wedge compression fracture of T11-T12 vertebra, initial encounter for closed fracture: Secondary | ICD-10-CM

## 2018-10-22 DIAGNOSIS — M6289 Other specified disorders of muscle: Secondary | ICD-10-CM | POA: Insufficient documentation

## 2018-10-22 DIAGNOSIS — Y939 Activity, unspecified: Secondary | ICD-10-CM | POA: Insufficient documentation

## 2018-10-22 DIAGNOSIS — E785 Hyperlipidemia, unspecified: Secondary | ICD-10-CM | POA: Insufficient documentation

## 2018-10-22 LAB — COMPREHENSIVE METABOLIC PANEL
ALT: 21 U/L (ref 0–44)
AST: 38 U/L (ref 15–41)
Albumin: 4.1 g/dL (ref 3.5–5.0)
Alkaline Phosphatase: 50 U/L (ref 38–126)
Anion gap: 11 (ref 5–15)
BUN: 19 mg/dL (ref 8–23)
CO2: 24 mmol/L (ref 22–32)
Calcium: 8.6 mg/dL — ABNORMAL LOW (ref 8.9–10.3)
Chloride: 102 mmol/L (ref 98–111)
Creatinine, Ser: 1.02 mg/dL — ABNORMAL HIGH (ref 0.44–1.00)
GFR calc Af Amer: 56 mL/min — ABNORMAL LOW (ref >60)
GFR calc non Af Amer: 49 mL/min — ABNORMAL LOW (ref >60)
Glucose, Bld: 129 mg/dL — ABNORMAL HIGH (ref 70–99)
Potassium: 3.9 mmol/L (ref 3.5–5.1)
Sodium: 137 mmol/L (ref 135–145)
Total Bilirubin: 0.8 mg/dL (ref 0.3–1.2)
Total Protein: 7 g/dL (ref 6.5–8.1)

## 2018-10-22 LAB — CBC WITH DIFFERENTIAL/PLATELET
Abs Immature Granulocytes: 0.03 10*3/uL (ref 0.00–0.07)
Basophils Absolute: 0 10*3/uL (ref 0.0–0.1)
Basophils Relative: 0 %
Eosinophils Absolute: 0 10*3/uL (ref 0.0–0.5)
Eosinophils Relative: 0 %
HCT: 35.5 % — ABNORMAL LOW (ref 36.0–46.0)
Hemoglobin: 11.8 g/dL — ABNORMAL LOW (ref 12.0–15.0)
Immature Granulocytes: 0 %
Lymphocytes Relative: 13 %
Lymphs Abs: 1.6 10*3/uL (ref 0.7–4.0)
MCH: 31.1 pg (ref 26.0–34.0)
MCHC: 33.2 g/dL (ref 30.0–36.0)
MCV: 93.4 fL (ref 80.0–100.0)
Monocytes Absolute: 1.1 10*3/uL — ABNORMAL HIGH (ref 0.1–1.0)
Monocytes Relative: 9 %
Neutro Abs: 9 10*3/uL — ABNORMAL HIGH (ref 1.7–7.7)
Neutrophils Relative %: 78 %
Platelets: 185 10*3/uL (ref 150–400)
RBC: 3.8 MIL/uL — ABNORMAL LOW (ref 3.87–5.11)
RDW: 12.2 % (ref 11.5–15.5)
WBC: 11.6 10*3/uL — ABNORMAL HIGH (ref 4.0–10.5)
nRBC: 0 % (ref 0.0–0.2)

## 2018-10-22 IMAGING — MR MRI LUMBAR SPINE WITHOUT CONTRAST
4 of 5 series · 30 of 48 positions shown · non-contrast
Comparison: CT [DATE]. Radiography [DATE]. Chest CT
[DATE].

CLINICAL DATA: Back pain.  T12 fracture seen by CT.

EXAM:
MRI LUMBAR SPINE WITHOUT CONTRAST
TECHNIQUE: Multiplanar, multisequence MR imaging of the lumbar spine was
performed. No intravenous contrast was administered.

[Series 9: T2 · sagittal · 4.0mm · 0.81mm/px · 6 of 17 slices shown (1 of 2)]
[im 1/17]
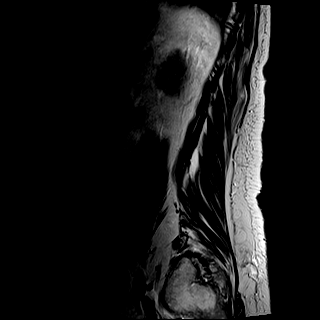
[im 4/17]
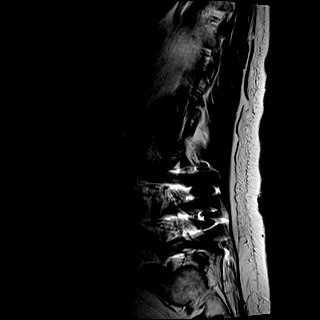
[im 7/17]
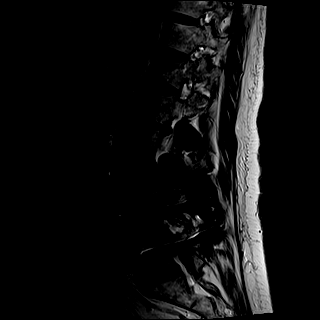
[im 10/17]
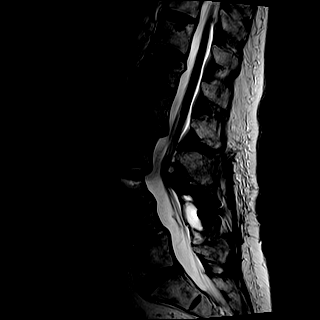
[im 13/17]
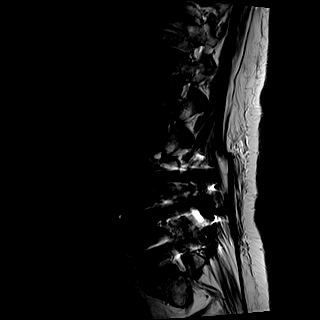
[im 17/17]
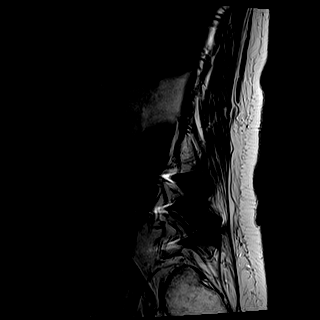

[Series 10: T1 · sagittal · 4.0mm · 0.81mm/px · 6 of 17 slices shown (1 of 2)]
[im 1/17]
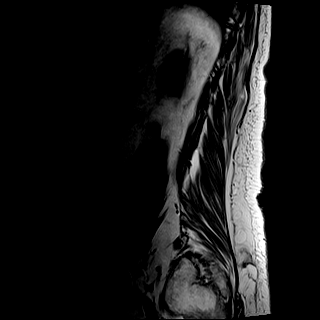
[im 4/17]
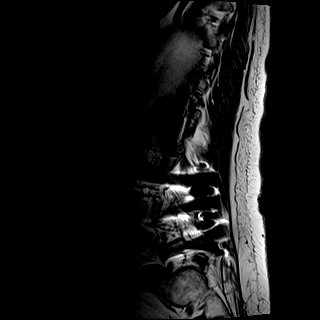
[im 7/17]
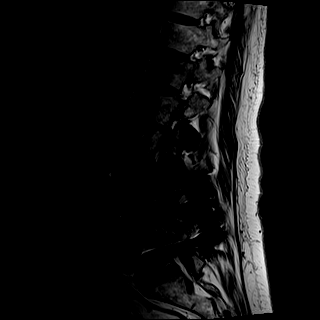
[im 10/17]
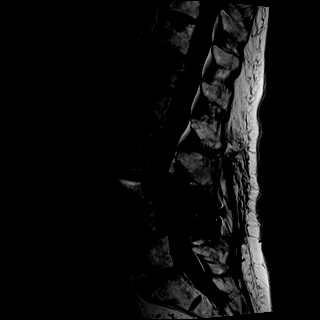
[im 13/17]
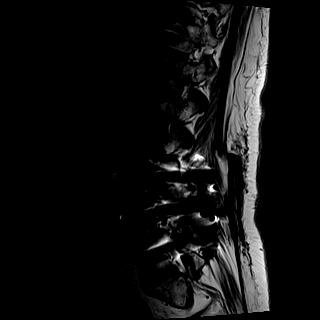
[im 17/17]
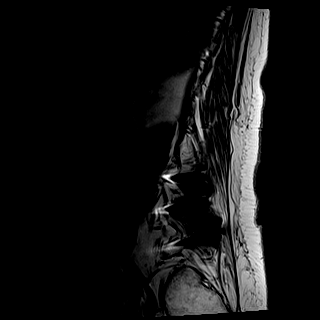

[Series 12: T2 · axial · 4.0mm · 0.78mm/px · z∈[+12,+256]mm · 9 of 39 slices shown (2 of 2)]
[im 1/39]
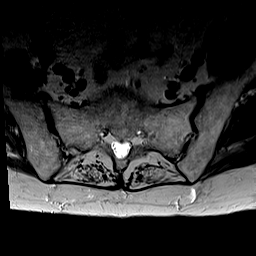
[im 6/39]
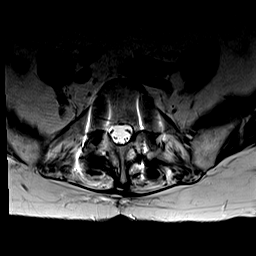
[im 11/39]
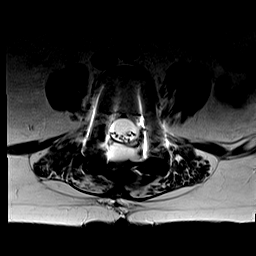
[im 17/39]
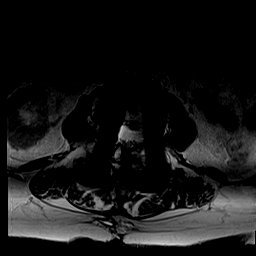
[im 20/39]
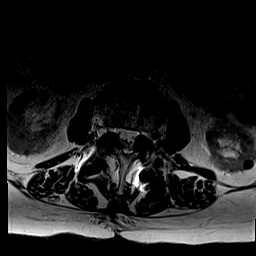
[im 22/39]
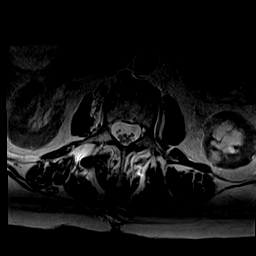
[im 28/39]
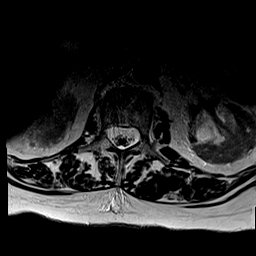
[im 33/39]
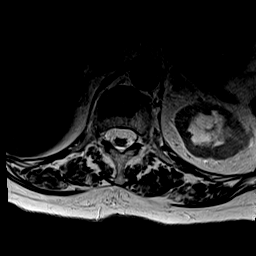
[im 39/39]
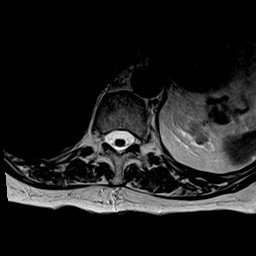

[Series 13: T1 · axial · 4.0mm · 0.39mm/px · z∈[+12,+256]mm · 9 of 39 slices shown (2 of 2)]
[im 1/39]
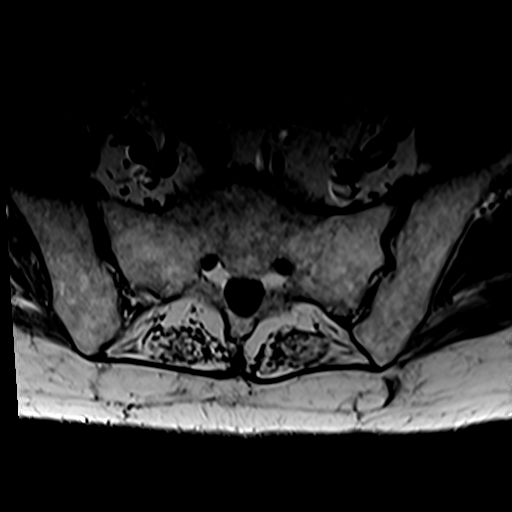
[im 6/39]
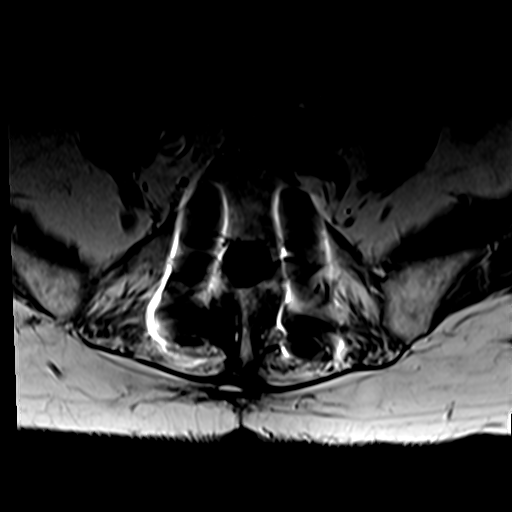
[im 11/39]
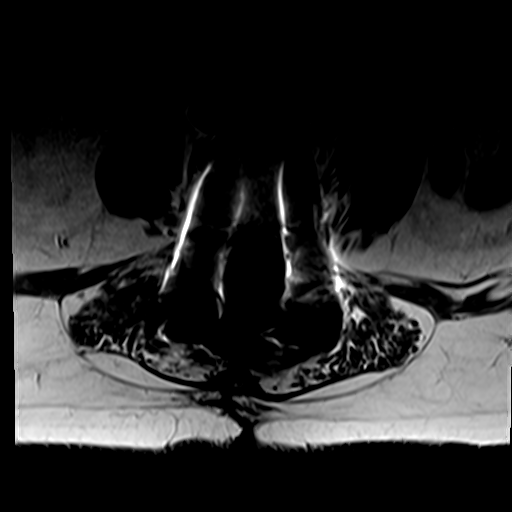
[im 17/39]
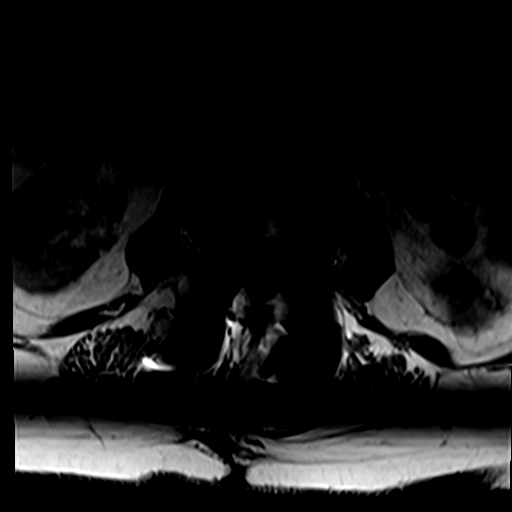
[im 20/39]
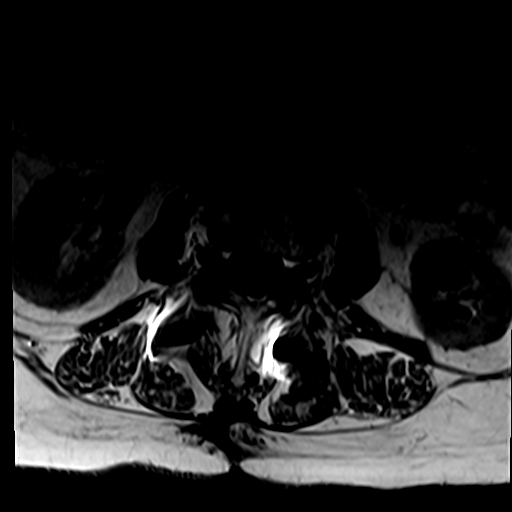
[im 22/39]
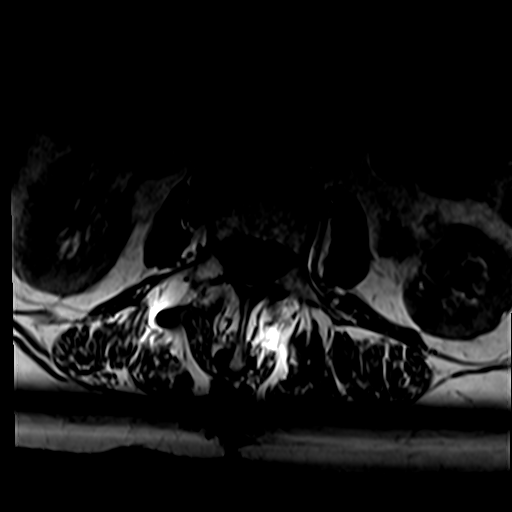
[im 28/39]
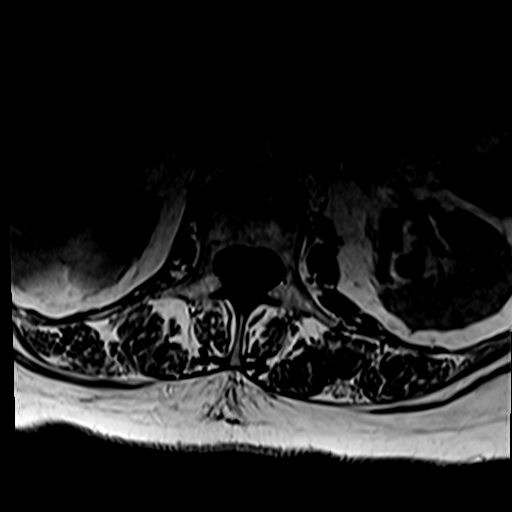
[im 33/39]
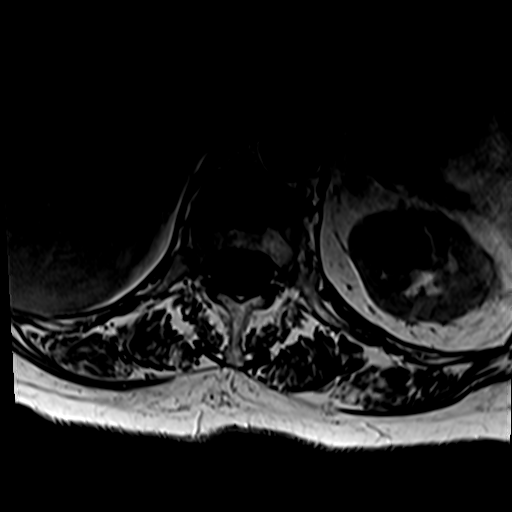
[im 39/39]
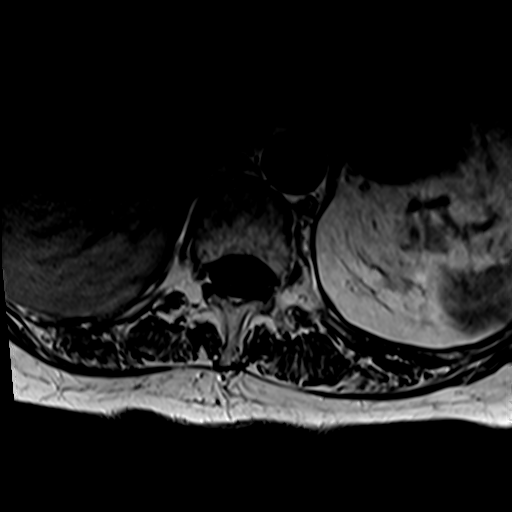

[30 of 48 positions shown; findings below may reference images not displayed]

FINDINGS: Segmentation:  5 lumbar type vertebral bodies.

Alignment: 3 mm retrolisthesis L1-2 and L2-3. Fixed 3 mm
anterolisthesis L3-4 and L4-5.

Vertebrae: Compression fracture at T12 with loss of height centrally
of 20%. This is probably subacute but certainly not completely
healed. The appearance is indeterminate for a benign fracture versus
is a pathologic fracture. There is minimal posterior bowing of the
posterosuperior margin of the vertebral body by 2 mm. Ordinary
appearing superior endplate Schmorl's node at T11. Old minor
compression deformity at L4 which is completely healed. Discogenic
endplate edema of a mild degree at the L1-2 level.

Conus medullaris and cauda equina: Conus extends to the L1-2 level.
Conus and cauda equina appear normal.

Paraspinal and other soft tissues: Negative

Disc levels:

No compressive stenosis at the T12 fracture level. The T12-L1 disc
is normal.

L1-2: Retrolisthesis of 3 mm. Endplate osteophytes and bulging of
the disc. Facet and ligamentous hypertrophy. Stenosis of both
lateral recesses without definite neural compression.

L2-3: Retrolisthesis of 3 mm. Endplate osteophytes and bulging of
the disc. Facet degeneration and hypertrophy. No central canal
stenosis. Mild narrowing of the lateral recesses but without visible
neural compression.

L3 through L5: Previous posterior decompression and fusion. No
complicating features seen to suggest nonunion. Sufficient patency
of the canal and foramina.

L5-S1: Mild disc bulge and facet degeneration. No canal or foraminal
stenosis.
IMPRESSION: Compression fracture at T12 with loss of height centrally of 20%.
Posterior bowing of the posterosuperior margin of the vertebral body
by 2 mm, not causing compressive narrowing of the canal. The
appearance of the fracture suggests a subacute nature. The pattern
of marrow edema is indeterminate for benign fracture versus the
possibility of an underlying bone marrow lesion. Does the patient
have any history of cancer? I do not see any other marrow space
lesions in the region of concern, a finding that favors benign
nature.

Degenerative findings at L1-2 and L2-3. 3 mm retrolisthesis at each
level. Endplate osteophytes, bulging of the disc and facet
hypertrophy. At both of these levels, there is narrowing of the
lateral recesses but no distinct neural compression. Discogenic
endplate edema at the L1-2 level could also contribute back pain.

Good appearance at the decompression and fusion segment from L3
through L5.

## 2018-10-22 MED ORDER — DEXAMETHASONE SODIUM PHOSPHATE 10 MG/ML IJ SOLN
10.0000 mg | Freq: Once | INTRAMUSCULAR | Status: AC
  Administered 2018-10-22: 10 mg via INTRAVENOUS
  Filled 2018-10-22: qty 1

## 2018-10-22 MED ORDER — KETOROLAC TROMETHAMINE 30 MG/ML IJ SOLN
15.0000 mg | Freq: Once | INTRAMUSCULAR | Status: AC
  Administered 2018-10-22: 15 mg via INTRAVENOUS
  Filled 2018-10-22: qty 1

## 2018-10-22 NOTE — ED Notes (Signed)
Patient to MR.

## 2018-10-22 NOTE — TOC Initial Note (Signed)
Transition of Care [facility]) - Initial/Assessment Note    Patient Details  Name: Michele [name] MRN: [id number] Date of Birth: [birth year]  Transition of [facility]) CM/SW Contact:    Fredric Mare, LCSW Phone Number: 10/22/2018, 3:19 PM  Clinical Narrative:                 Patient is an 85 year old female that presents to the ED for back pain. Patient stated that she has been experiencing back pain for about a week.  Lovena Le at the [facility] [place] asked for CSW to send her an FL2 since patient will be staying in the respite care unit temporarily.  Patient shared that she enjoys staying at the [facility] [place], and likes her appointment. Patient stated that she is receiving PT services there as well.   Expected Discharge Plan: Assisted Living Barriers to Discharge: No Barriers Identified   Patient Goals and CMS Choice        Expected Discharge Plan and Services Expected Discharge Plan: Assisted Living   Discharge Planning Services: CM Consult                                          Prior Living Arrangements/Services   Lives with:: Other (Comment)(Assisted Living; [facility] ) Patient language and need for interpreter reviewed:: Yes Do you feel safe going back to the place where you live?: Yes      Need for Family Participation in Patient Care: Yes (Comment) Care giver support system in place?: No (comment)   Criminal Activity/Legal Involvement Pertinent to Current Situation/Hospitalization: No - Comment as needed  Activities of Daily Living      Permission Sought/Granted Permission sought to share information with : Family Supports Permission granted to share information with : Yes, Verbal Permission Granted  Share Information with NAME: Mickel Baas      Permission granted to share info w Relationship: daughter  Permission granted to share info w Contact Information: [phone]  Emotional Assessment Appearance:: Appears stated  age Attitude/Demeanor/Rapport: Engaged Affect (typically observed): Accepting Orientation: : Oriented to Self, Oriented to Place, Oriented to  Time, Oriented to Situation Alcohol / Substance Use: Never Used Psych Involvement: No (comment)  Admission diagnosis:  back pain Patient Active Problem List   Diagnosis Date Noted  . Chest pain 09/10/2016   PCP:  Derinda Late, MD Pharmacy:   [facility] [place] to [address] [place] [address] Phone: [phone] Fax: [phone]  [id number] - [place], [place] [address] [place] [address] Phone: [phone] Fax: [place], [place] - [place] [address] [place] [address] Phone: [phone] Fax: [phone]  [id number] - [place], [place] [address] Phone: [phone] Fax: [phone]     Social Determinants of Health (SDOH) Interventions    Readmission Risk Interventions No flowsheet data found.

## 2018-10-22 NOTE — NC FL2 (Signed)
[place] LEVEL OF CARE SCREENING TOOL     IDENTIFICATION  Patient Name: Michele [name] Birthdate: 1928/01/24 Sex: female Admission Date (Current Location): 10/22/2018  [place] and [place] Number:  Engineering geologist and Address:  [facility], [address] [place], [place] [address]      Provider Number: [id number]  Attending Physician Name and Address:  Earleen Newport, MD  Relative Name and Phone Number:  Mickel Baas daughter, [phone]    Current Level of Care: Hospital Recommended Level of Care: [place] Prior Approval Number:    Date Approved/Denied:   PASRR Number: [id number]  Discharge Plan: SNF    Current Diagnoses: Patient Active Problem List   Diagnosis Date Noted  . Chest pain 09/10/2016    Orientation RESPIRATION BLADDER Height & Weight     Self, Time, Situation, Place  Normal Continent Weight: 145 lb (65.8 kg) Height:  5\' 5"  (165.1 cm)  BEHAVIORAL SYMPTOMS/MOOD NEUROLOGICAL BOWEL NUTRITION STATUS      Continent Diet  AMBULATORY STATUS COMMUNICATION OF NEEDS Skin   Extensive Assist   Normal                       Personal Care Assistance Level of Assistance  Bathing, Feeding, Dressing Bathing Assistance: Maximum assistance Feeding assistance: Independent Dressing Assistance: Maximum assistance     Functional Limitations Info             SPECIAL CARE FACTORS FREQUENCY                       Contractures Contractures Info: Not present    Additional Factors Info  Code Status, Allergies Code Status Info: FULL  Allergies Info: aspirin, codeine, morphine and related, scallops, sulfa antibiotics           Current Medications (10/22/2018):  This is the current hospital active medication list No current facility-administered medications for this encounter.    Current Outpatient Medications  Medication Sig Dispense Refill  . cephALEXin (KEFLEX) 250 MG  capsule Take 1 capsule (250 mg total) by mouth 2 (two) times daily for 10 days. 20 capsule 0  . clopidogrel (PLAVIX) 75 MG tablet Take 75 mg by mouth at bedtime.    . docusate sodium 100 MG CAPS Take 100 mg by mouth 2 (two) times daily. (Patient not taking: Reported on 08/05/2015) 60 capsule 0  . levothyroxine (SYNTHROID, LEVOTHROID) 112 MCG tablet Take 112 mcg by mouth at bedtime.    Marland Kitchen lisinopril (PRINIVIL,ZESTRIL) 5 MG tablet Take 5 mg by mouth at bedtime.    . meloxicam (MOBIC) 7.5 MG tablet Take 1 tablet (7.5 mg total) by mouth daily. (Patient not taking: Reported on 08/05/2015) 14 tablet 0  . Multiple Vitamins-Minerals (PRESERVISION AREDS 2 PO) Take 1 capsule by mouth 2 (two) times daily.    . ondansetron (ZOFRAN) 4 MG tablet Take 1 tablet (4 mg total) by mouth every 8 (eight) hours as needed for nausea or vomiting. (Patient not taking: Reported on 08/05/2015) 20 tablet 0  . pantoprazole (PROTONIX) 40 MG tablet Take 1 tablet (40 mg total) by mouth daily before breakfast. 15 tablet 0  . rosuvastatin (CRESTOR) 5 MG tablet Take 5 mg by mouth at bedtime.       Discharge Medications: Please see discharge summary for a list of discharge medications.  Relevant Imaging Results:  Relevant Lab Results:   Additional Information SSN: [id number]  Tania Julie-Anne Torain, LCSW

## 2018-10-22 NOTE — TOC Progression Note (Signed)
Transition of Care [facility]) - Progression Note    Patient Details  Name: Michele [name] MRN: [id number] Date of Birth: 06/28/1927  Transition of Care [facility]) CM/SW Contact  Tania Roy Snuffer, LCSW Phone Number: 10/22/2018, 3:50 PM  Clinical Narrative:    CSW faxed pt's FL2 to the [facility] [place] [phone]).    Expected Discharge Plan: Assisted Living Barriers to Discharge: No Barriers Identified  Expected Discharge Plan and Services Expected Discharge Plan: Assisted Living   Discharge Planning Services: CM Consult                                           Social Determinants of Health (SDOH) Interventions    Readmission Risk Interventions No flowsheet data found.

## 2018-10-22 NOTE — ED Triage Notes (Signed)
Patient had back surgery five years ago. She now states that she has been having back pain for about a week. She also was treated yesterday for a UTI.

## 2018-10-22 NOTE — ED Provider Notes (Signed)
[facility] Emergency Department Provider Note       Time seen: ----------------------------------------- 11:09 AM on 10/22/2018 -----------------------------------------   I have reviewed the triage vital signs and the nursing notes.  HISTORY   Chief Complaint Back Pain    HPI Michele [name] is a 85 y.o. female with a history of anemia, back pain, constipation, GERD, hyperlipidemia, hypertension who presents to the ED for severe back pain.  Patient reports back surgery 5 years ago.  She is been having back pain for the past week.  Yesterday she was treated for UTI.  She describes 10 out of 10 low back pain that is all the way across her lower back.  She denies any falls or trauma.  Past Medical History:  Diagnosis Date  . Anemia    yrs ago  . Arthritis   . Back pain   . Carotid bruit    right side more so than left;but per pt not enough to clean out  . Constipation    metamucil bid  . Diverticulosis    right  . GERD (gastroesophageal reflux disease)    related to some meds;but doesn't take any medications  . History of migraine    stopped with menopause  . Hyperlipidemia    takes crestor daily  . Hypertension    takes Lisinopril daily  . Hypothyroidism    takes Synthroid daily  . Internal hemorrhoids   . Joint pain   . Joint swelling   . Macular degeneration    dry  . Nocturia   . Numbness    left foot  . Osteopenia   . Phlebitis    hx of-38yrs ago right leg;takes Plavix daily but on hold for surgery  . PONV (postoperative nausea and vomiting)   . Urinary frequency   . Urinary urgency     Patient Active Problem List   Diagnosis Date Noted  . Chest pain 09/10/2016    Past Surgical History:  Procedure Laterality Date  . BREAST BIOPSY Right 12/1997   neg  . BREAST CYST ASPIRATION Right 06/21/1954   rt fna- milk duct-neg  . COLONOSCOPY    . DILATION AND CURETTAGE OF UTERUS    . OOPHORECTOMY Bilateral   . removal of  breast cyst  1998  . removal of neck cyst  1996  . removal of tubes and ovaries      Allergies Aspirin; Codeine; Morphine and related; Other; Scallops [shellfish allergy]; and Sulfa antibiotics  Social History Social History   Tobacco Use  . Smoking status: Never Smoker  . Smokeless tobacco: Never Used  Substance Use Topics  . Alcohol use: No  . Drug use: No   Review of Systems Constitutional: Negative for fever. Cardiovascular: Negative for chest pain. Respiratory: Negative for shortness of breath. Gastrointestinal: Negative for abdominal pain, vomiting and diarrhea. Genitourinary: Negative for dysuria. Musculoskeletal: positive for back pain Skin: Negative for rash. Neurological: Negative for headaches, focal weakness or numbness.  All systems negative/normal/unremarkable except as stated in the HPI  ____________________________________________   PHYSICAL EXAM:  VITAL SIGNS: ED Triage Vitals  Enc Vitals Group     BP --      Pulse --      Resp --      Temp --      Temp src --      SpO2 --      Weight 10/22/18 1106 145 lb (65.8 kg)     Height 10/22/18 1106 5\' 5"  (1.651  m)     Head Circumference --      Peak Flow --      Pain Score 10/22/18 1104 10     Pain Loc --      Pain Edu? --      Excl. in [place]? --    onstitutional: Alert and oriented.  Mild distress from pain Eyes: Conjunctivae are normal. Normal extraocular movements. Cardiovascular: Normal rate, regular rhythm. No murmurs, rubs, or gallops. Respiratory: Normal respiratory effort without tachypnea nor retractions. Breath sounds are clear and equal bilaterally. No wheezes/rales/rhonchi. Gastrointestinal: Soft and nontender. Normal bowel sounds Musculoskeletal: Pain in the back with range of motion of both lower extremities Neurologic:  Normal speech and language. No gross focal neurologic deficits are appreciated.  Skin:  Skin is warm, dry and intact. No rash noted. Psychiatric: Mood and affect are  normal. Speech and behavior are normal.  ____________________________________________  ED COURSE:  As part of my medical decision making, I reviewed the following data within the [place] History obtained from family if available, nursing notes, old chart and ekg, as well as notes from prior ED visits. Patient presented for back pain with recent visit for UTI, we will assess with labs and imaging as indicated at this time.   Procedures  Michele [name] was evaluated in Emergency Department on 10/22/2018 for the symptoms described in the history of present illness. She was evaluated in the context of the global COVID-19 pandemic, which necessitated consideration that the patient might be at risk for infection with the SARS-CoV-2 virus that causes COVID-19. Institutional protocols and algorithms that pertain to the evaluation of patients at risk for COVID-19 are in a state of rapid change based on information released by regulatory bodies including the CDC and federal and state organizations. These policies and algorithms were followed during the patient's care in the ED.  ____________________________________________   LABS (pertinent positives/negatives)  Labs Reviewed  CBC WITH DIFFERENTIAL/PLATELET - Abnormal; Notable for the following components:      Result Value   WBC 11.6 (*)    RBC 3.80 (*)    Hemoglobin 11.8 (*)    HCT 35.5 (*)    Neutro Abs 9.0 (*)    Monocytes Absolute 1.1 (*)    All other components within normal limits  COMPREHENSIVE METABOLIC PANEL - Abnormal; Notable for the following components:   Glucose, Bld 129 (*)    Creatinine, Ser 1.02 (*)    Calcium 8.6 (*)    GFR calc non Af Amer 49 (*)    GFR calc Af Amer 56 (*)    All other components within normal limits    RADIOLOGY Images were viewed by me  MRI lumbar spine IMPRESSION: Compression fracture at T12 with loss of height centrally of 20%. Posterior bowing of the posterosuperior  margin of the vertebral body by 2 mm, not causing compressive narrowing of the canal. The appearance of the fracture suggests a subacute nature. The pattern of marrow edema is indeterminate for benign fracture versus the possibility of an underlying bone marrow lesion. Does the patient have any history of cancer? I do not see any other marrow space lesions in the region of concern, a finding that favors benign nature.  Degenerative findings at L1-2 and L2-3. 3 mm retrolisthesis at each level. Endplate osteophytes, bulging of the disc and facet hypertrophy. At both of these levels, there is narrowing of the lateral recesses but no distinct neural compression. Discogenic endplate edema at  the L1-2 level could also contribute back pain.  Good appearance at the decompression and fusion segment from L3 through L5. ____________________________________________   DIFFERENTIAL DIAGNOSIS   Degenerative disc disease, herniation, fracture, sciatica  FINAL ASSESSMENT AND PLAN  T12 compression fracture   Plan: The patient had presented for persistent low back pain. Patient's labs did not reveal any acute abnormality. Patient's imaging did reveal compression fracture T12 with loss of height of 20%.  There are some other degenerative findings at L1 and L2 and L2 and L3.  MRI was reviewed by neurosurgery.  This is felt to be a simple compression fracture.  She has been placed in a TLSO orthotic. Laurence Aly, MD    Note: This note was generated in part or whole with voice recognition software. Voice recognition is usually quite accurate but there are transcription errors that can and very often do occur. I apologize for any typographical errors that were not detected and corrected.     Earleen Newport, MD 10/22/18 [phone]

## 2018-10-22 NOTE — ED Notes (Addendum)
Report given to [place] at [facility].

## 2018-10-22 NOTE — TOC Transition Note (Signed)
Transition of Care [facility]) - CM/SW Discharge Note   Patient Details  Name: Michele [name] MRN: [id number] Date of Birth: 1928-05-06  [facility] of Care [facility]) CM/SW Contact:  Fredric Mare, LCSW Phone Number: 10/22/2018, 3:53 PM   Clinical Narrative:    Patient will be going back to the [facility] at [place], and will be staying on the SNF side. Patient's daughter was notified.  CSW asked Lovena Le at the [facility] [place] if she needed anything else; Lovena Le denied.    Final next level of care: Skilled Nursing Facility Barriers to Discharge: Barriers Resolved   Patient Goals and CMS Choice     Choice offered to / list presented to : Adult Children  Discharge Placement PASRR number recieved: 10/22/18              Patient to be transferred to facility by: Daughter Name of family member notified: Daughter, Mickel Baas  Patient and family notified of of transfer: 10/22/18  Discharge Plan and Services   Discharge Planning Services: CM Consult                                 Social Determinants of Health (SDOH) Interventions     Readmission Risk Interventions No flowsheet data found.

## 2018-10-22 NOTE — ED Notes (Addendum)
Attempted to call [facility] to give report. Unable to get in contact with a nurse. Told to call Lauren RN at [phone].

## 2018-10-22 NOTE — ED Notes (Signed)
Patient's daughter states that she is on the way to pick her up.

## 2018-10-22 NOTE — ED Notes (Signed)
Patient lives at [facility] at [place].

## 2018-10-24 LAB — URINE CULTURE: Culture: >=100000 — AB

## 2018-12-09 ENCOUNTER — Other Ambulatory Visit: Payer: Self-pay | Admitting: Student

## 2018-12-09 DIAGNOSIS — S22089D Unspecified fracture of T11-T12 vertebra, subsequent encounter for fracture with routine healing: Secondary | ICD-10-CM

## 2018-12-17 ENCOUNTER — Other Ambulatory Visit: Payer: Self-pay | Admitting: Surgery

## 2018-12-17 DIAGNOSIS — R1032 Left lower quadrant pain: Secondary | ICD-10-CM

## 2018-12-23 ENCOUNTER — Other Ambulatory Visit: Payer: Self-pay

## 2018-12-23 ENCOUNTER — Ambulatory Visit
Admission: RE | Admit: 2018-12-23 | Discharge: 2018-12-23 | Disposition: A | Discharge status: Home / Self Care | Payer: Medicare HMO | Source: Ambulatory Visit | Attending: Surgery | Admitting: Surgery

## 2018-12-23 ENCOUNTER — Ambulatory Visit
Admission: RE | Admit: 2018-12-23 | Discharge: 2018-12-23 | Disposition: A | Discharge status: Home / Self Care | Payer: Medicare HMO | Source: Ambulatory Visit | Attending: Student | Admitting: Student

## 2018-12-23 DIAGNOSIS — R1032 Left lower quadrant pain: Secondary | ICD-10-CM

## 2018-12-23 DIAGNOSIS — S22089D Unspecified fracture of T11-T12 vertebra, subsequent encounter for fracture with routine healing: Secondary | ICD-10-CM | POA: Diagnosis present

## 2018-12-23 IMAGING — MR MRI LUMBAR SPINE WITHOUT CONTRAST
5 series · 33 of 48 positions shown · non-contrast
Comparison: MRI lumbar spine [DATE]

CLINICAL DATA: Closed fracture twelfth thoracic vertebra with
healing subsequent encounter

EXAM:
MRI LUMBAR SPINE WITHOUT CONTRAST
TECHNIQUE: Multiplanar, multisequence MR imaging of the lumbar spine was
performed. No intravenous contrast was administered.

[Series 5: T2 · sagittal · 4.0mm · 1.02mm/px · 6 of 18 slices shown (1 of 2)]
[im 1/18]
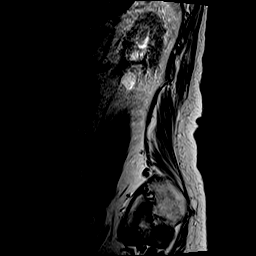
[im 4/18]
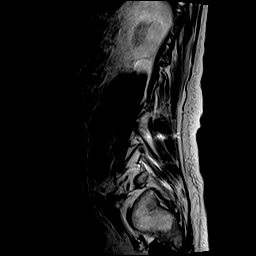
[im 7/18]
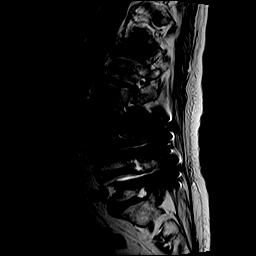
[im 11/18]
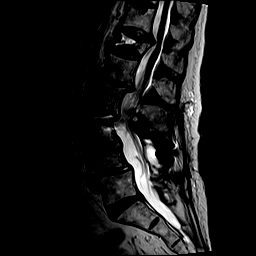
[im 14/18]
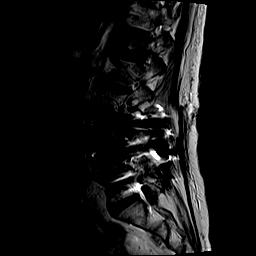
[im 18/18]
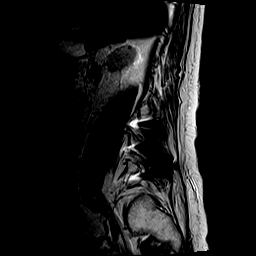

[Series 6: T1 · sagittal · 4.0mm · 1.02mm/px · 6 of 15 slices shown (1 of 2)]
[im 1/15]
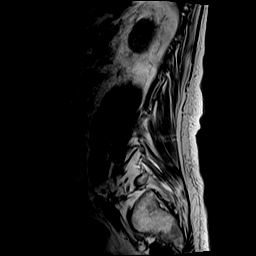
[im 3/15]
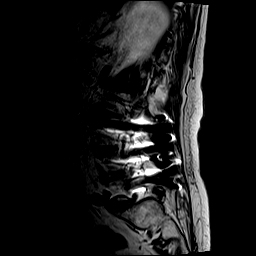
[im 6/15]
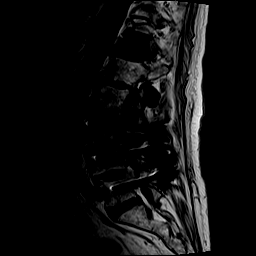
[im 9/15]
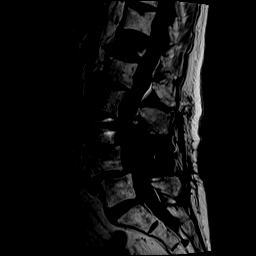
[im 12/15]
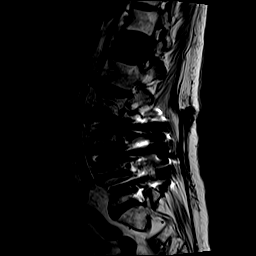
[im 15/15]
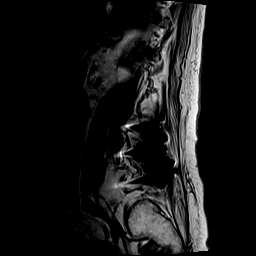

[Series 7: STIR · sagittal · 4.0mm · 0.51mm/px · 3 of 15 slices shown]
[im 1/15]
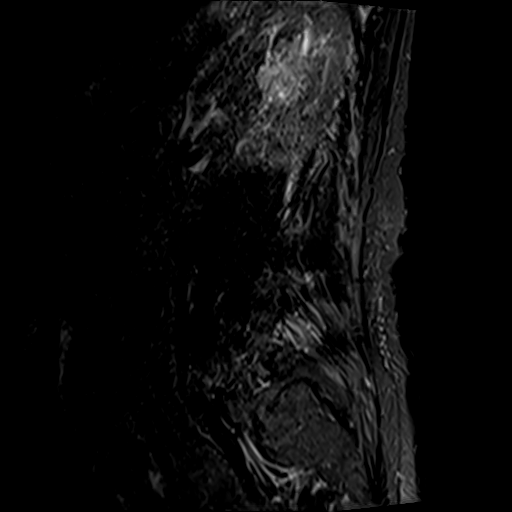
[im 3/15]
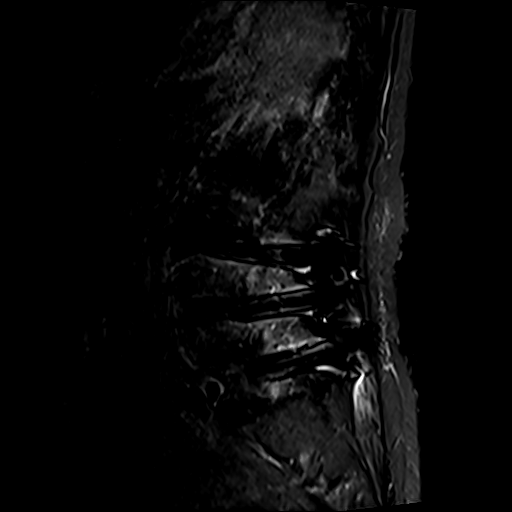
[im 6/15]
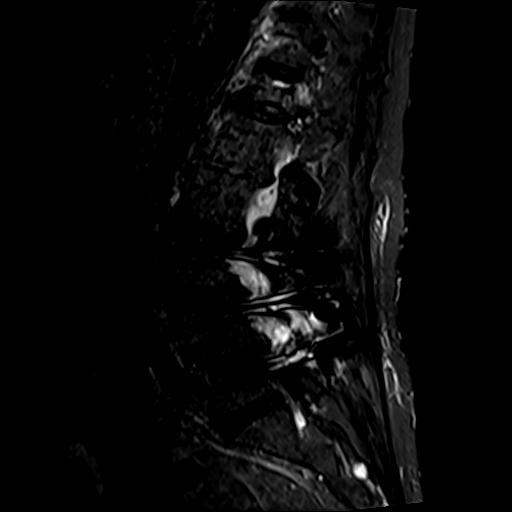

[Series 8: T2 · axial · 4.0mm · 0.78mm/px · z∈[-94,+141]mm · 9 of 38 slices shown (2 of 2)]
[im 1/38]
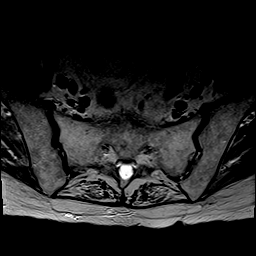
[im 6/38]
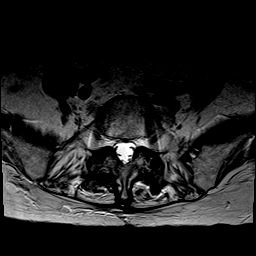
[im 11/38]
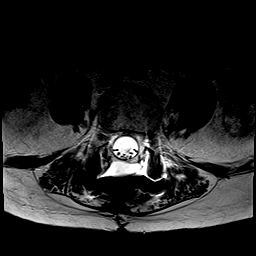
[im 16/38]
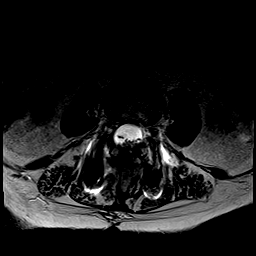
[im 19/38]
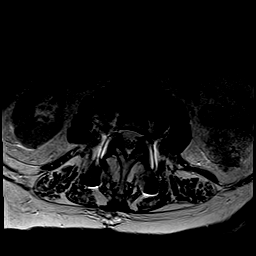
[im 22/38]
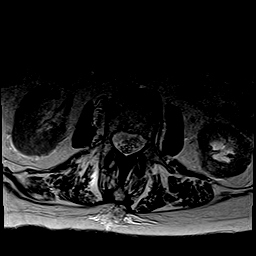
[im 27/38]
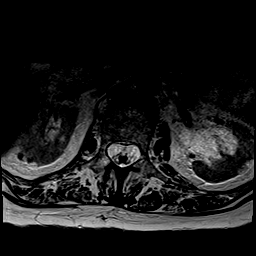
[im 32/38]
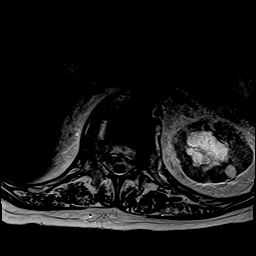
[im 38/38]
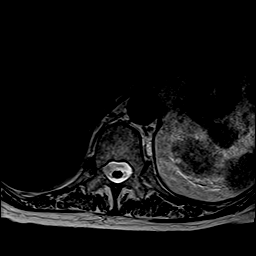

[Series 9: T1 · axial · 4.0mm · 0.39mm/px · z∈[-94,+141]mm · 9 of 38 slices shown (2 of 2)]
[im 1/38]
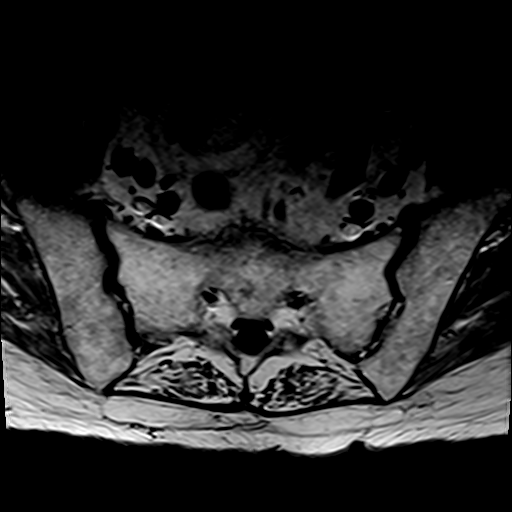
[im 6/38]
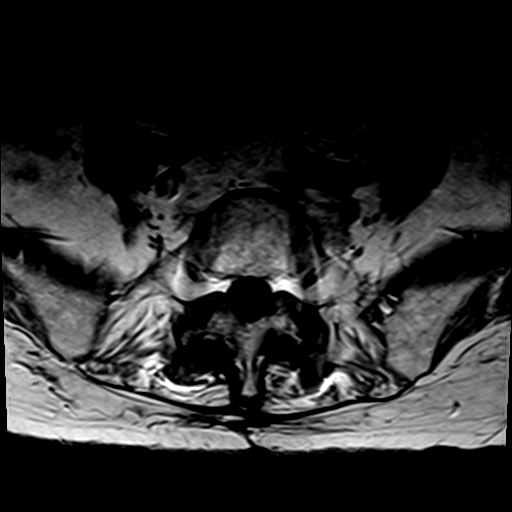
[im 11/38]
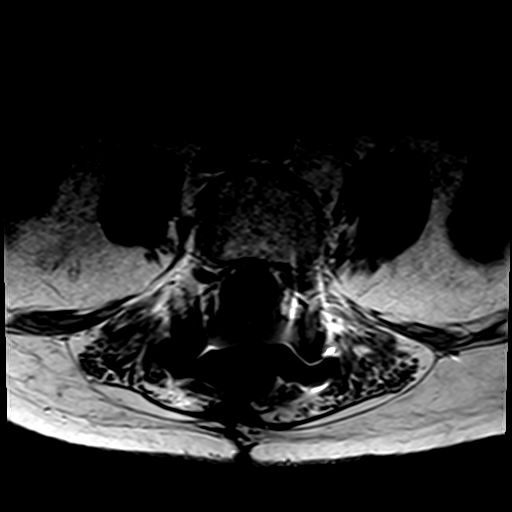
[im 16/38]
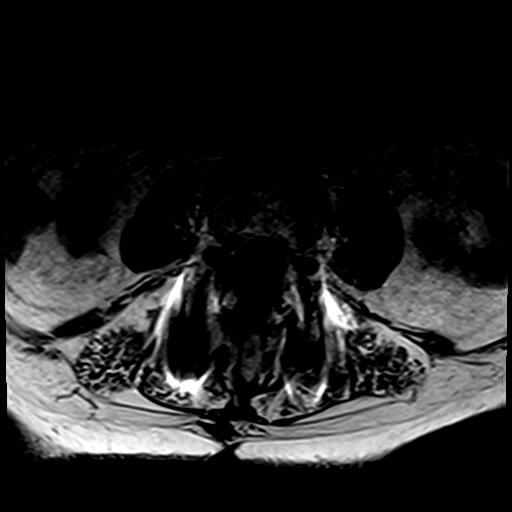
[im 19/38]
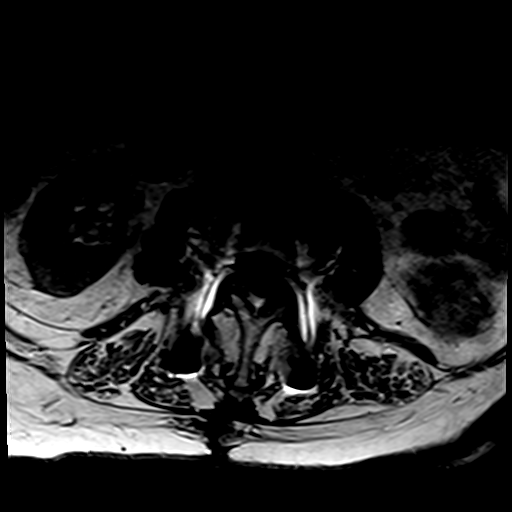
[im 22/38]
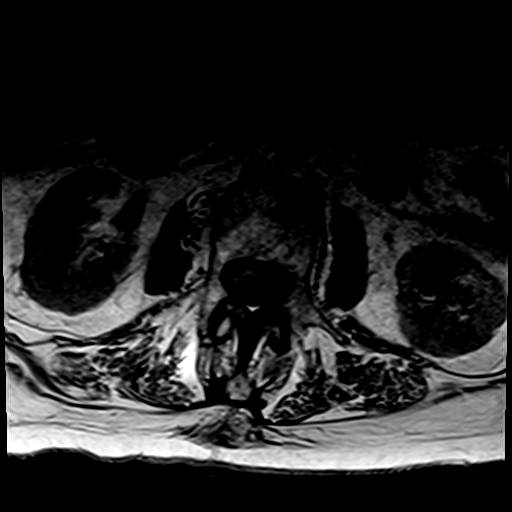
[im 27/38]
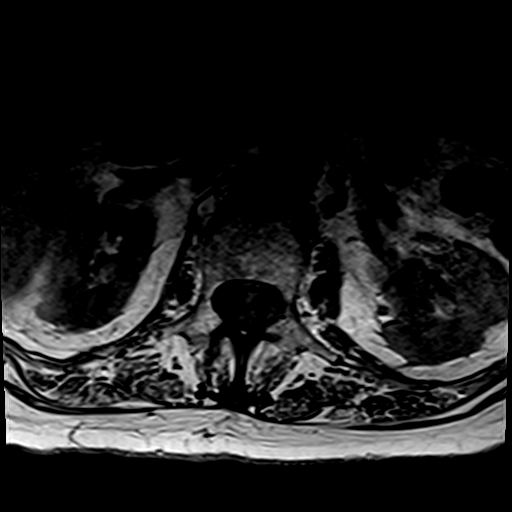
[im 32/38]
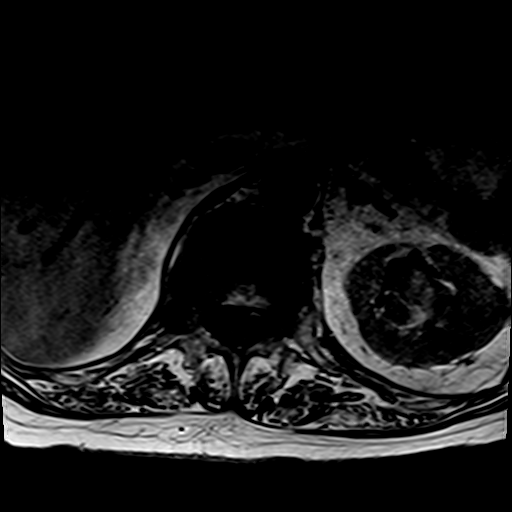
[im 38/38]
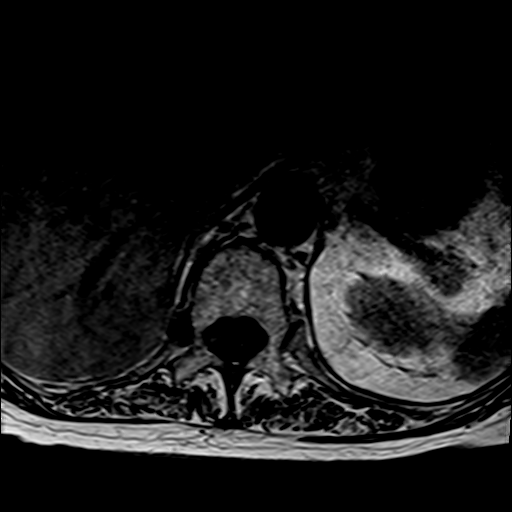

[33 of 48 positions shown; findings below may reference images not displayed]

FINDINGS: Segmentation:  Normal

Alignment: 3 mm retrolisthesis L1-2 and L2-3 unchanged. 4 mm
anterolisthesis L3-4 and 6 mm anterolisthesis L4-5 unchanged.

Vertebrae: Compression fracture T12 has progressed in the interval.
There is now approximately 60% loss of vertebral body height. There
is progressive bone marrow edema in the vertebral body due to
progressive fracture. There is retropulsion of bone into the canal
which has progressed in the interval.

No other fractures.  Pedicle screw fusion L3-4 L4-5.

Conus medullaris and cauda equina: Conus extends to the L2 level.
Conus and cauda equina appear normal.

Paraspinal and other soft tissues: Negative for paraspinous mass or
fluid collection.

Disc levels:

T11-12: Moderately severe spinal stenosis with mild flattening of
the distal spinal cord due to retropulsion of T12 fracture into the
canal. This has progressed in the interval.

T12-L1: No significant stenosis

L1-2: Disc and facet degeneration. Retrolisthesis L1-2. Mild to
moderate spinal stenosis. Subarticular stenosis bilaterally

L2-3: Retrolisthesis with disc and facet degeneration. Mild
subarticular stenosis bilaterally with mild spinal stenosis

L3-4: PLIF with posterior decompression.  Negative for stenosis

L4-5: PLIF with posterior decompression.  Negative for stenosis

L5-S1: Bilateral facet degeneration.  Negative for stenosis.
IMPRESSION: 1. Significant progression of fracture T12 compared with the prior
MRI of [DATE]. Progression of retropulsion of T12 into the canal
now with moderate spinal stenosis which has progressed
significantly.
2. No other new findings compared with the prior MRI.

## 2018-12-23 IMAGING — MR MRI PELVIS WITHOUT AND WITH CONTRAST
5 of 8 series · 29 of 48 positions shown · IV contrast (gadavist)
Comparison: None.

CLINICAL DATA: Left groin pain. Low back pain and pelvic pain.
Prior lumbar surgery.

EXAM:
MRI PELVIS WITHOUT AND WITH CONTRAST
TECHNIQUE: Multiplanar multisequence MR imaging of the pelvis was performed
both before and after administration of intravenous contrast.
CONTRAST:  6 cc Gadavist

[Series 8: T1 · axial · 4.0mm · 0.74mm/px · z∈[-112,+143]mm · 6 of 52 slices shown (1 of 2)]
[im 1/52]
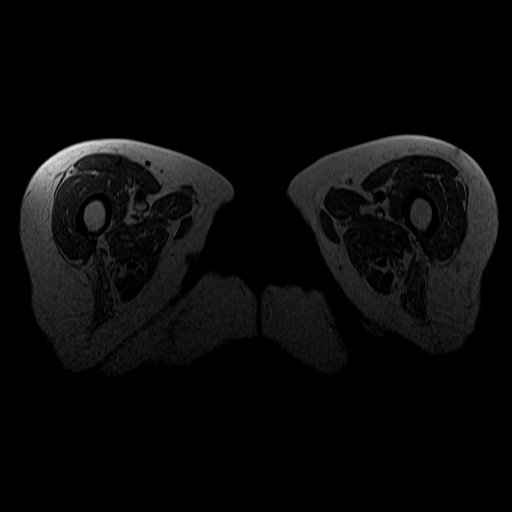
[im 11/52]
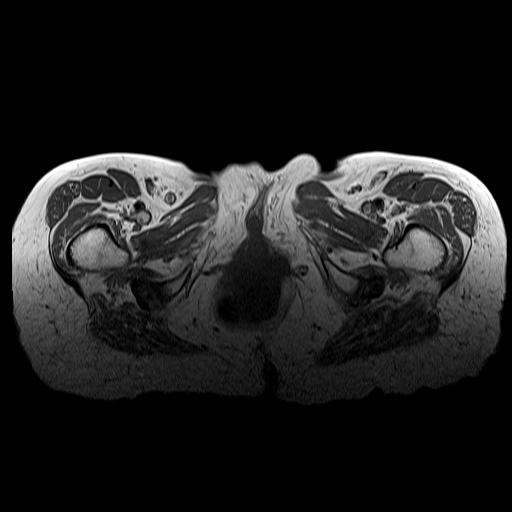
[im 21/52]
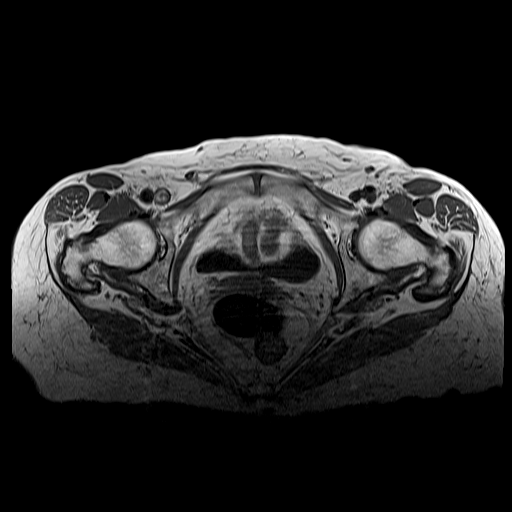
[im 31/52]
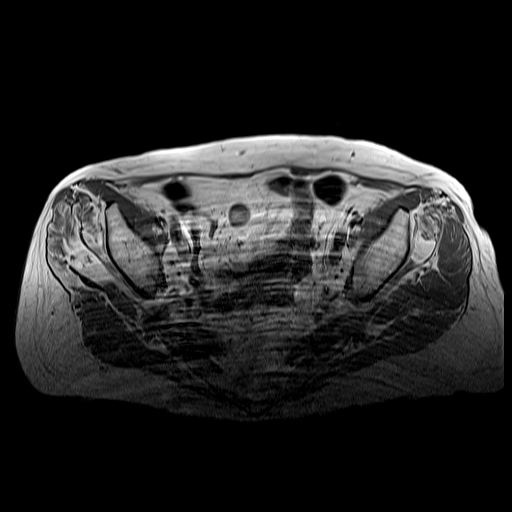
[im 41/52]
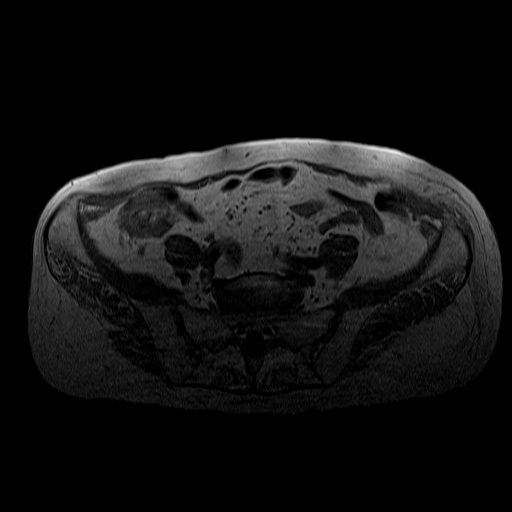
[im 52/52]
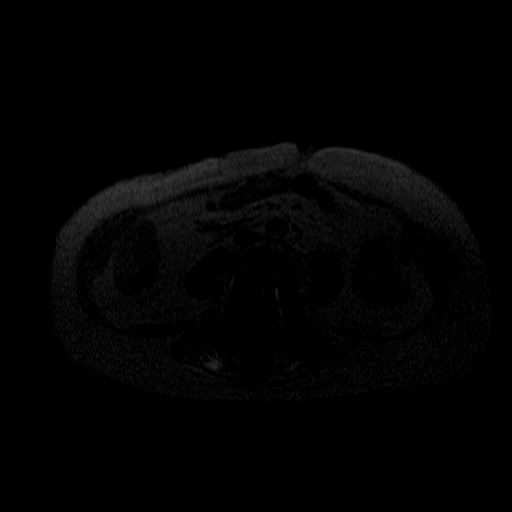

[Series 9: T2 fat-sat · axial · 4.0mm · 0.74mm/px · z∈[-112,+143]mm · 6 of 52 slices shown (1 of 2)]
[im 1/52]
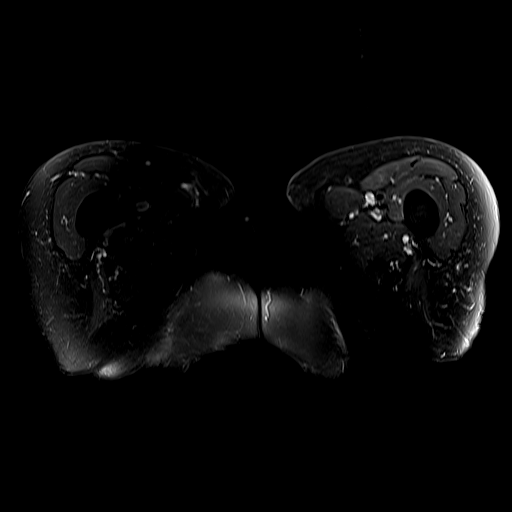
[im 11/52]
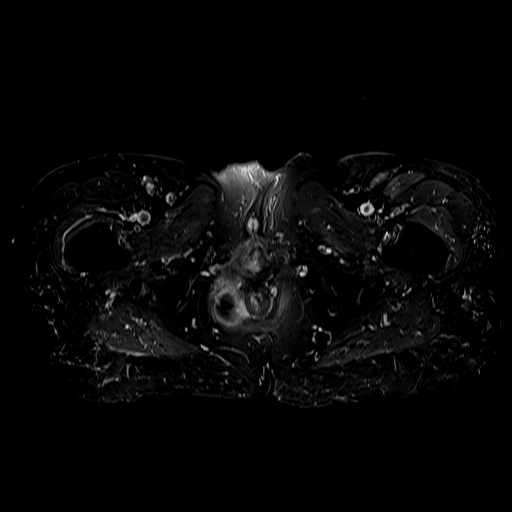
[im 21/52]
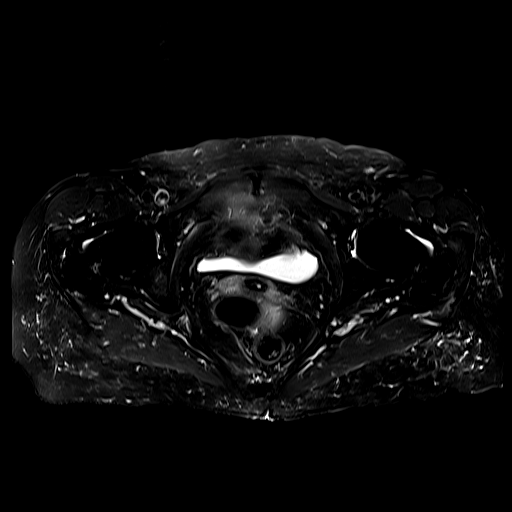
[im 31/52]
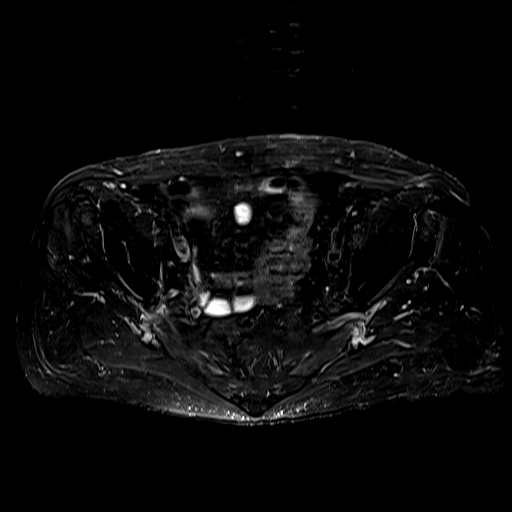
[im 41/52]
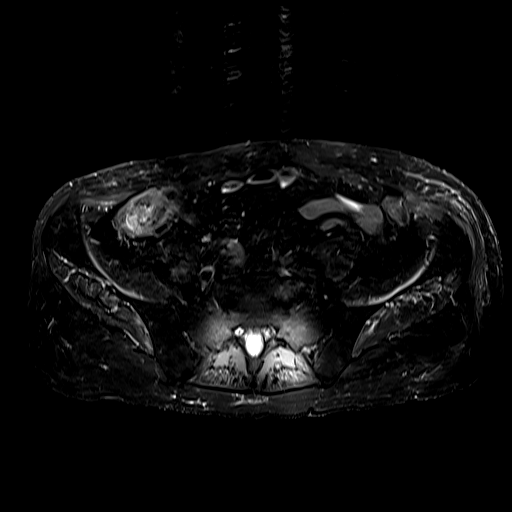
[im 52/52]
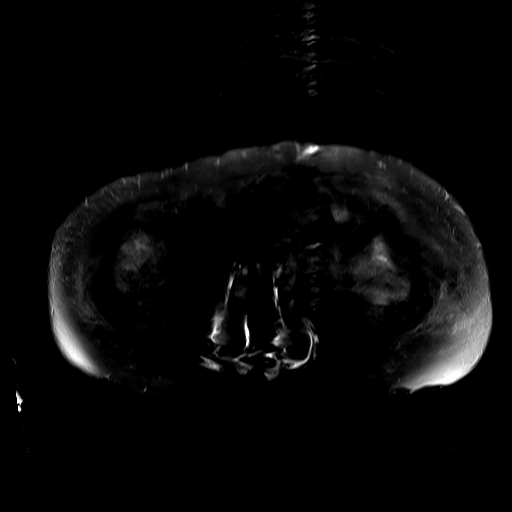

[Series 10: STIR · coronal · 4.0mm · 1.25mm/px · 5 of 40 slices shown]
[im 1/40]
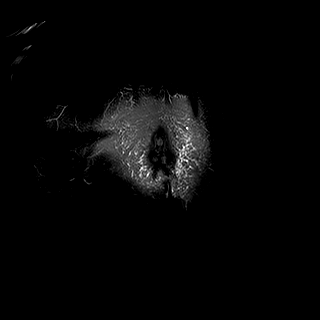
[im 10/40]
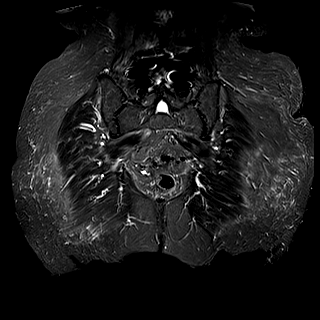
[im 20/40]
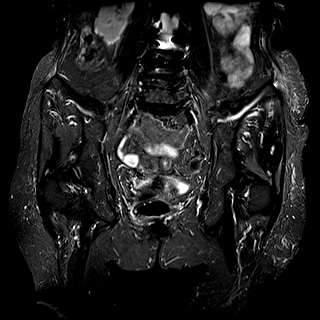
[im 30/40]
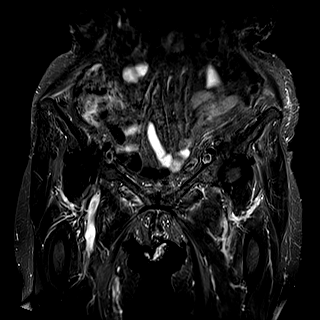
[im 40/40]
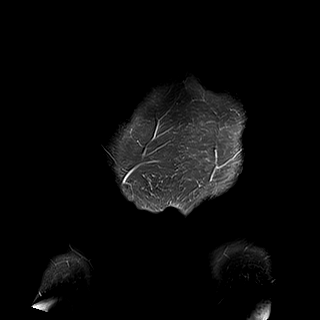

[Series 11: T1 · coronal · 4.0mm · 1.25mm/px · 5 of 40 slices shown (2 of 2)]
[im 1/40]
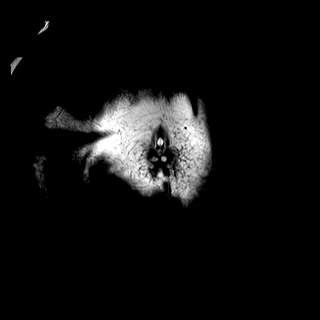
[im 10/40]
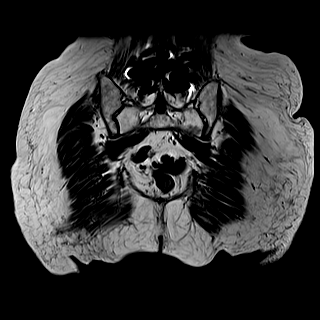
[im 20/40]
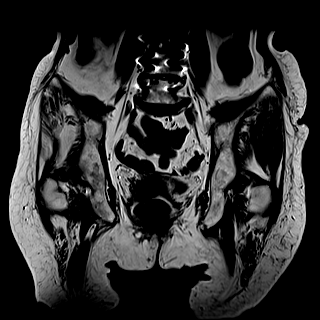
[im 30/40]
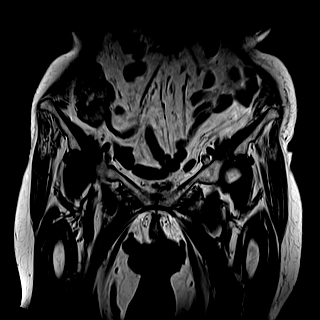
[im 40/40]
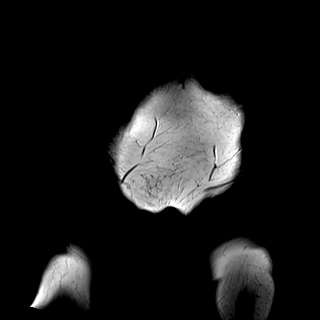

[Series 12: T2 fat-sat · sagittal · 4.0mm · 0.94mm/px · 7 of 72 slices shown (2 of 2)]
[im 1/72]
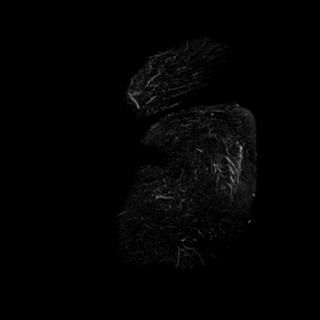
[im 9/72]
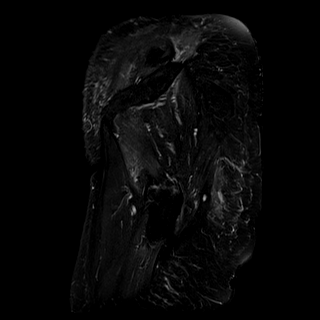
[im 18/72]
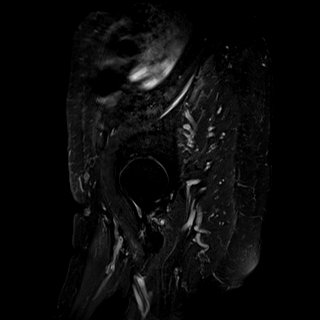
[im 27/72]
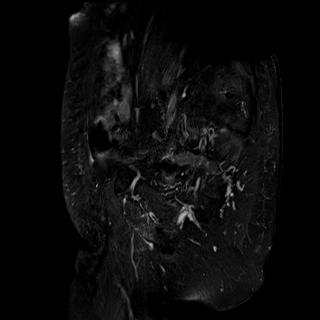
[im 45/72]
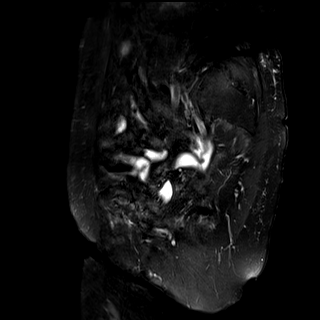
[im 54/72]
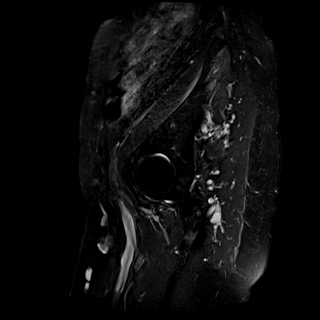
[im 63/72]
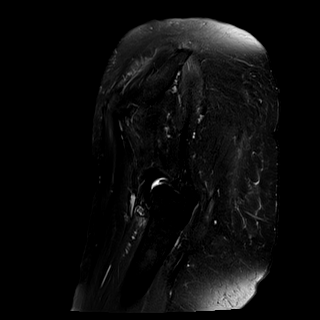

[29 of 48 positions shown; findings below may reference images not displayed]

FINDINGS: Musculoskeletal: There is a small degenerative cyst in the
superolateral aspect of the left acetabulum. The proximal femurs
appear normal bilaterally. The other bones of the pelvis appear
normal. SI joints are normal. Small amount of fluid in both hip
joints, nonspecific.

Previous posterior lumbar fusion at L3-4 and L4-5.

Chronic slight atrophy of the right gluteus medius and minimus
muscles. Slight atrophy of the left gluteus minimus muscle. After
contrast administration there is minimal enhancement at the left
gluteus maximus insertion on the left greater trochanter. This is
not felt to be significant. No bursitis.

Urinary Tract:  Normal.

Bowel: Scattered diverticula in the distal colon. No significant
abnormalities.

Vascular/Lymphatic: No adenopathy. Distal abdominal aorta and iliac
arteries are normal.

Reproductive: Small amount of fluid in the endometrial cavity of the
otherwise normal appearing uterus. Patient reports bilateral
oophorectomies.

Other:  None
IMPRESSION: No significant abnormalities. Minimal inflammation of the distal
left gluteus maximus adjacent to the left greater trochanter.

## 2018-12-23 MED ORDER — GADOBUTROL 1 MMOL/ML IV SOLN
6.0000 mL | Freq: Once | INTRAVENOUS | Status: AC | PRN
  Administered 2018-12-23: 10:00:00 6 mL via INTRAVENOUS

## 2018-12-27 ENCOUNTER — Encounter: Payer: Self-pay | Admitting: Emergency Medicine

## 2018-12-27 ENCOUNTER — Other Ambulatory Visit: Payer: Self-pay

## 2018-12-27 ENCOUNTER — Emergency Department: Payer: Medicare HMO

## 2018-12-27 ENCOUNTER — Emergency Department
Admission: EM | Admit: 2018-12-27 | Discharge: 2018-12-27 | Disposition: A | Discharge status: Skilled Nursing Facility | Payer: Medicare HMO | Attending: Student | Admitting: Student

## 2018-12-27 DIAGNOSIS — I10 Essential (primary) hypertension: Secondary | ICD-10-CM | POA: Diagnosis not present

## 2018-12-27 DIAGNOSIS — R531 Weakness: Secondary | ICD-10-CM | POA: Diagnosis present

## 2018-12-27 DIAGNOSIS — M25562 Pain in left knee: Secondary | ICD-10-CM | POA: Diagnosis not present

## 2018-12-27 DIAGNOSIS — W050XXA Fall from non-moving wheelchair, initial encounter: Secondary | ICD-10-CM | POA: Diagnosis not present

## 2018-12-27 DIAGNOSIS — W19XXXA Unspecified fall, initial encounter: Secondary | ICD-10-CM

## 2018-12-27 DIAGNOSIS — Z79899 Other long term (current) drug therapy: Secondary | ICD-10-CM | POA: Insufficient documentation

## 2018-12-27 DIAGNOSIS — M25561 Pain in right knee: Secondary | ICD-10-CM | POA: Diagnosis not present

## 2018-12-27 LAB — COMPREHENSIVE METABOLIC PANEL
ALT: 15 U/L (ref 0–44)
AST: 25 U/L (ref 15–41)
Albumin: 3.8 g/dL (ref 3.5–5.0)
Alkaline Phosphatase: 45 U/L (ref 38–126)
Anion gap: 13 (ref 5–15)
BUN: 11 mg/dL (ref 8–23)
CO2: 28 mmol/L (ref 22–32)
Calcium: 8.9 mg/dL (ref 8.9–10.3)
Chloride: 101 mmol/L (ref 98–111)
Creatinine, Ser: 0.7 mg/dL (ref 0.44–1.00)
GFR calc Af Amer: >60 mL/min (ref >60)
GFR calc non Af Amer: >60 mL/min (ref >60)
Glucose, Bld: 141 mg/dL — ABNORMAL HIGH (ref 70–99)
Potassium: 3.2 mmol/L — ABNORMAL LOW (ref 3.5–5.1)
Sodium: 142 mmol/L (ref 135–145)
Total Bilirubin: 0.6 mg/dL (ref 0.3–1.2)
Total Protein: 6.9 g/dL (ref 6.5–8.1)

## 2018-12-27 LAB — CBC WITH DIFFERENTIAL/PLATELET
Abs Immature Granulocytes: 0.02 10*3/uL (ref 0.00–0.07)
Basophils Absolute: 0 10*3/uL (ref 0.0–0.1)
Basophils Relative: 1 %
Eosinophils Absolute: 0.1 10*3/uL (ref 0.0–0.5)
Eosinophils Relative: 1 %
HCT: 39 % (ref 36.0–46.0)
Hemoglobin: 12.3 g/dL (ref 12.0–15.0)
Immature Granulocytes: 0 %
Lymphocytes Relative: 22 %
Lymphs Abs: 1.3 10*3/uL (ref 0.7–4.0)
MCH: 30.8 pg (ref 26.0–34.0)
MCHC: 31.5 g/dL (ref 30.0–36.0)
MCV: 97.5 fL (ref 80.0–100.0)
Monocytes Absolute: 0.5 10*3/uL (ref 0.1–1.0)
Monocytes Relative: 8 %
Neutro Abs: 4 10*3/uL (ref 1.7–7.7)
Neutrophils Relative %: 68 %
Platelets: 266 10*3/uL (ref 150–400)
RBC: 4 MIL/uL (ref 3.87–5.11)
RDW: 13.7 % (ref 11.5–15.5)
WBC: 5.9 10*3/uL (ref 4.0–10.5)
nRBC: 0 % (ref 0.0–0.2)

## 2018-12-27 LAB — URINALYSIS, COMPLETE (UACMP) WITH MICROSCOPIC
Bacteria, UA: NONE SEEN
Bilirubin Urine: NEGATIVE
Glucose, UA: NEGATIVE mg/dL
Hgb urine dipstick: NEGATIVE
Ketones, ur: 20 mg/dL — AB
Leukocytes,Ua: NEGATIVE
Nitrite: NEGATIVE
Protein, ur: 30 mg/dL — AB
Specific Gravity, Urine: 1.019 (ref 1.005–1.030)
pH: 6 (ref 5.0–8.0)

## 2018-12-27 IMAGING — CR LEFT KNEE - COMPLETE 4+ VIEW
4 series · 4 of 4 positions shown · non-contrast
Comparison: None.

CLINICAL DATA: Fall today with bilateral knee pain

EXAM:
LEFT KNEE - COMPLETE 4+ VIEW

[knee ap]
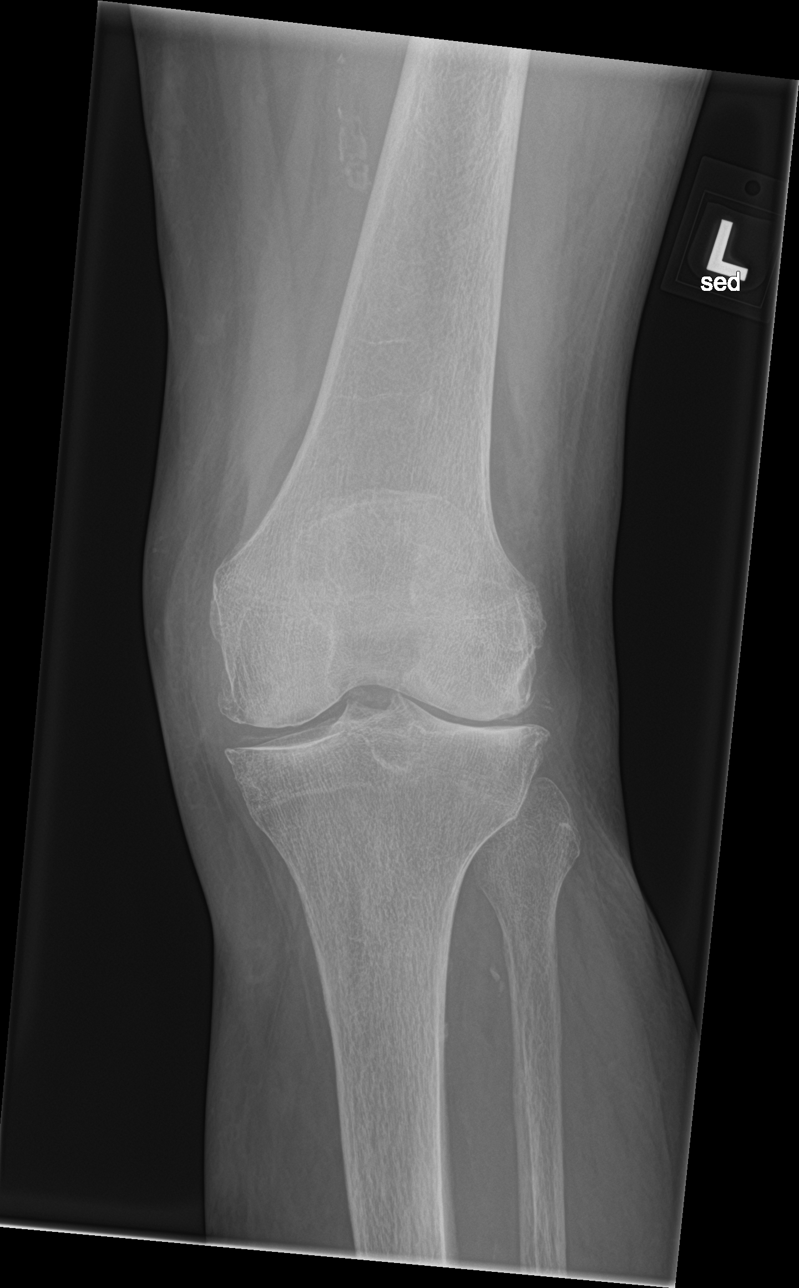

[knee obl (1 of 2)]
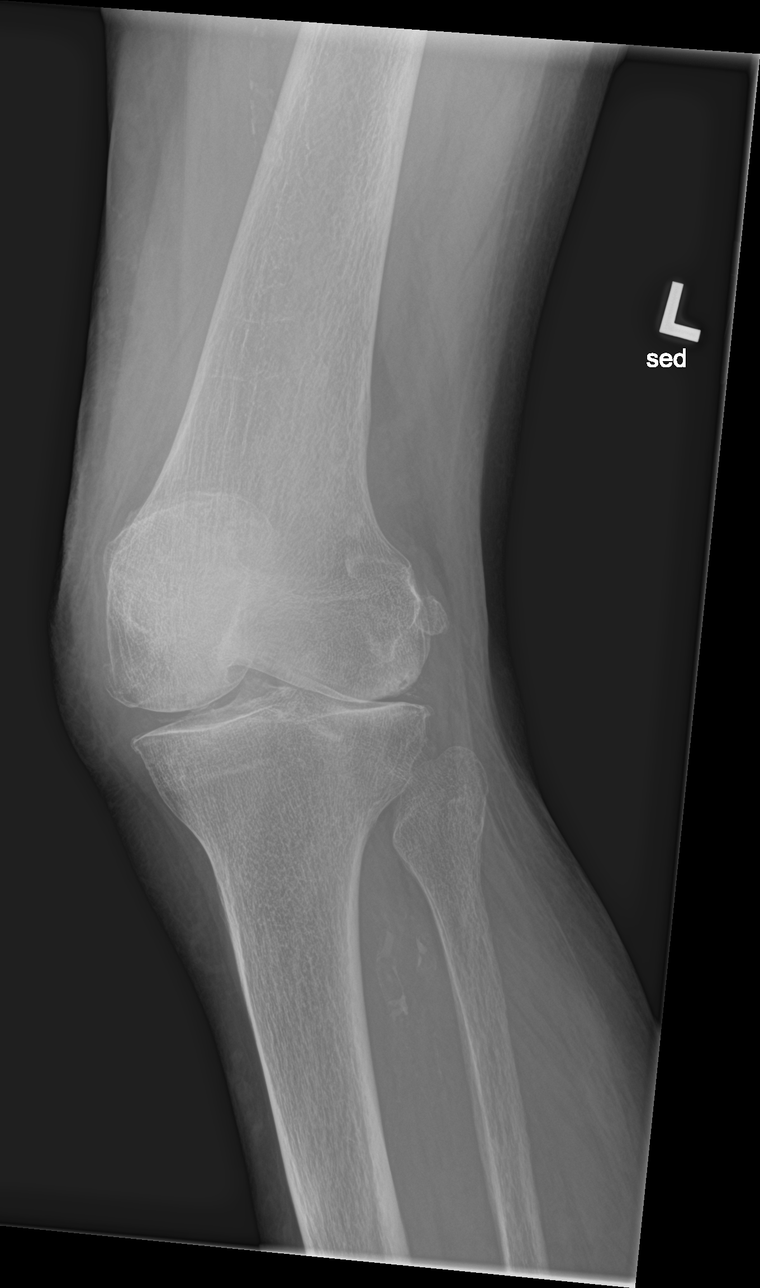

[knee obl (2 of 2)]
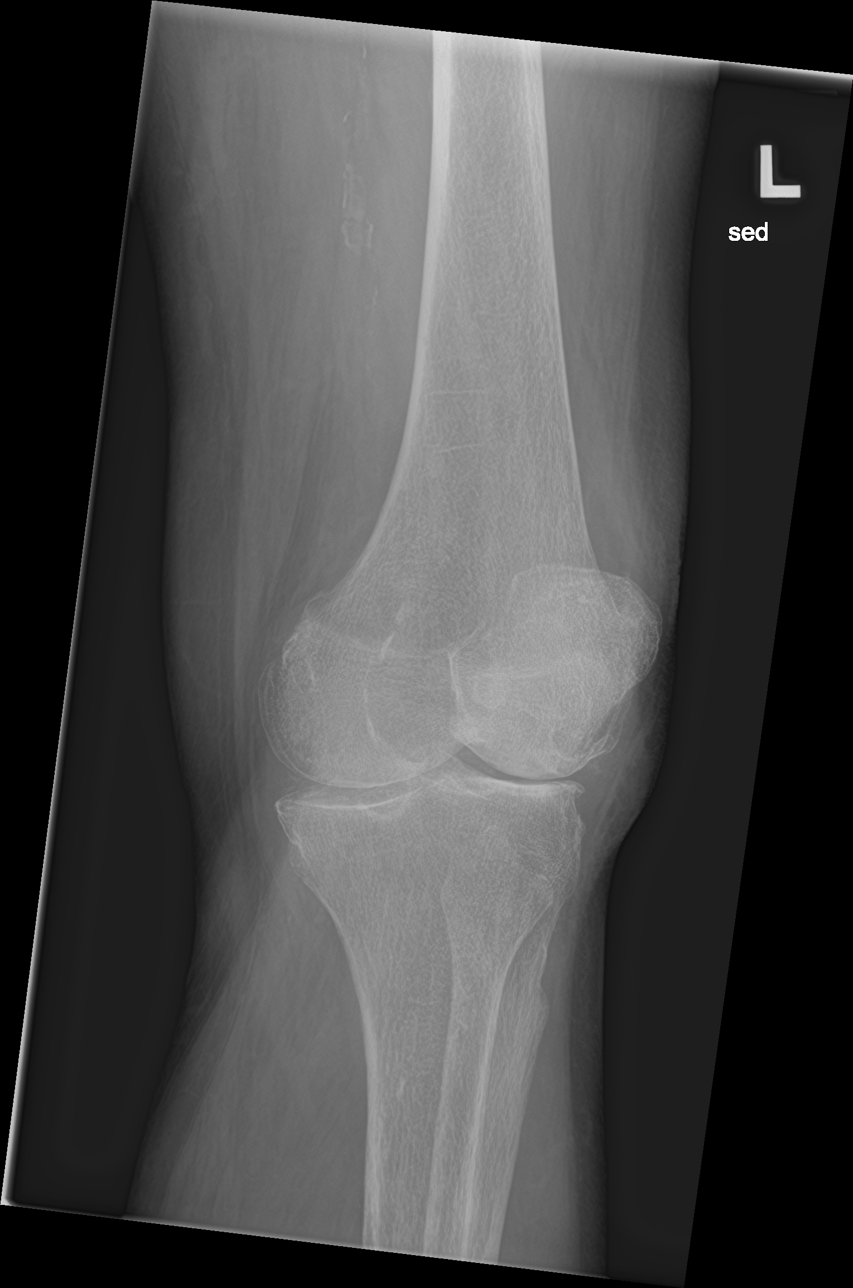

[knee lat]
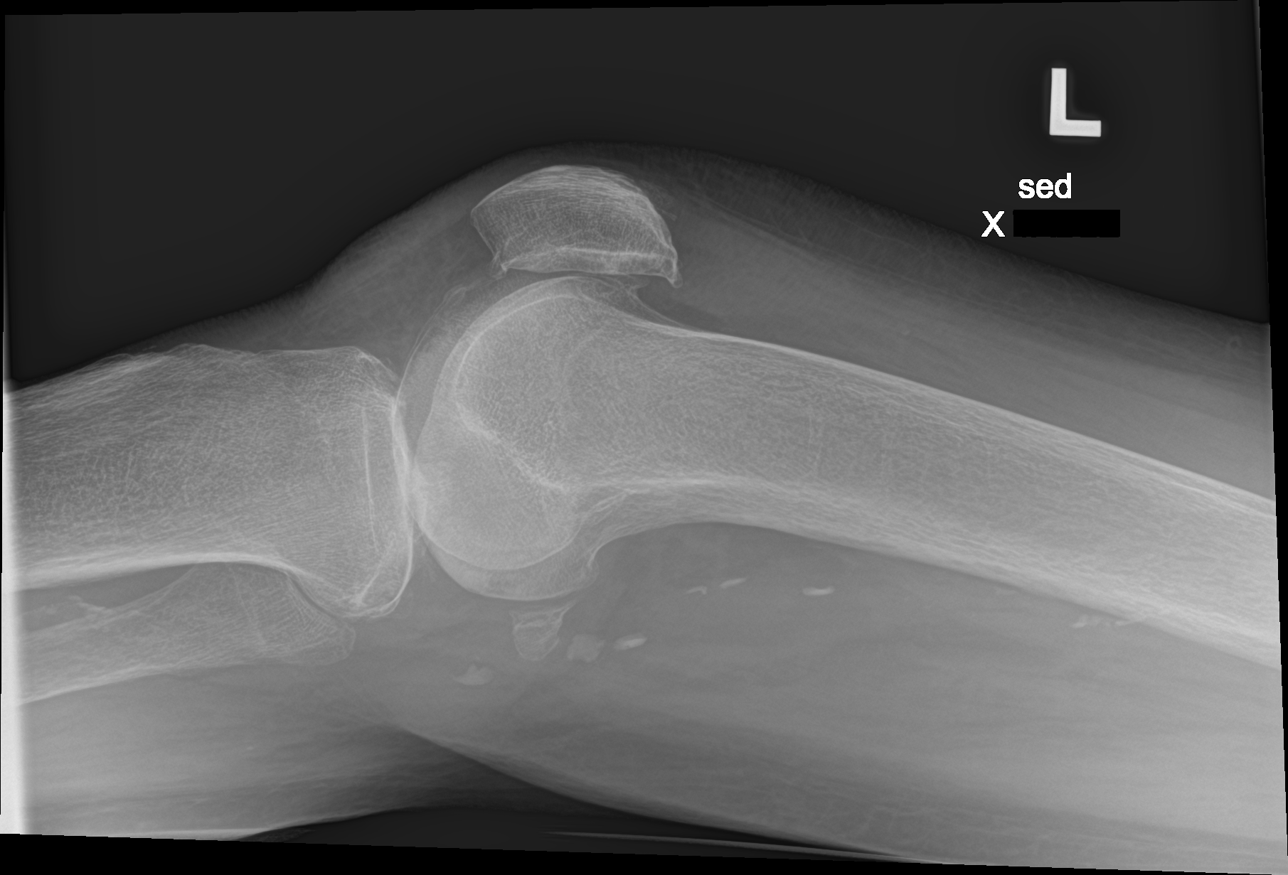

[4 of 4 positions shown; findings below may reference images not displayed]

FINDINGS: No fracture or dislocation. Trace suprapatellar joint effusion. No
suspicious focal osseous lesions. Mild to moderate tricompartmental
osteoarthritis, most prominent in the lateral compartment.
IMPRESSION: No left knee fracture or dislocation. Trace suprapatellar left knee
joint effusion. Mild-to-moderate tricompartmental left knee
osteoarthritis, most prominent in the lateral compartment.

## 2018-12-27 IMAGING — CR RIGHT KNEE - COMPLETE 4+ VIEW
4 series · 4 of 4 positions shown · non-contrast
Comparison: None.

CLINICAL DATA: Fall today with bilateral knee pain.

EXAM:
RIGHT KNEE - COMPLETE 4+ VIEW

[knee ap]
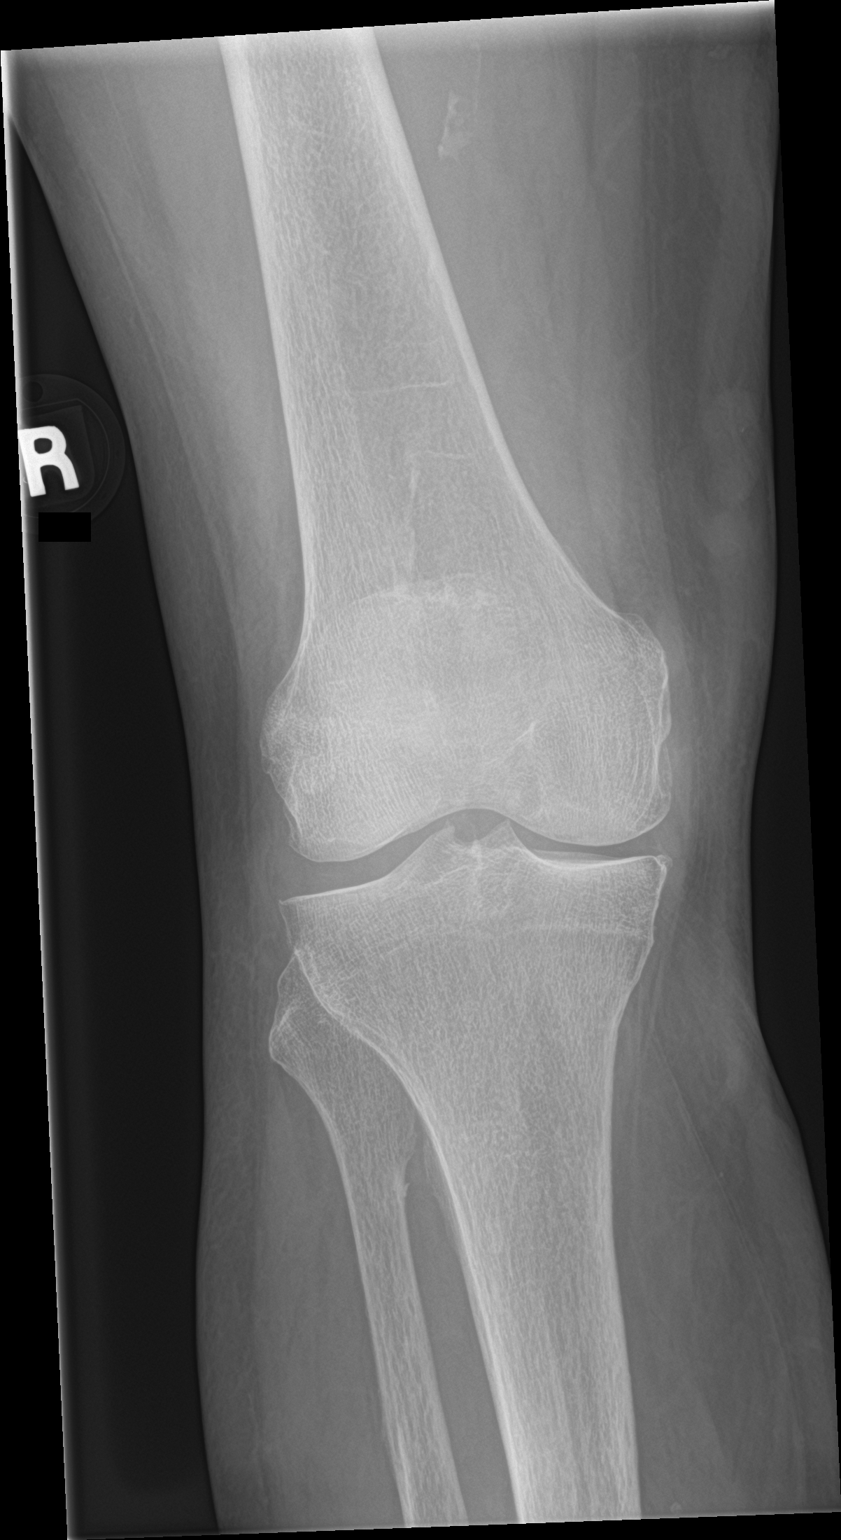

[knee obl (1 of 2)]
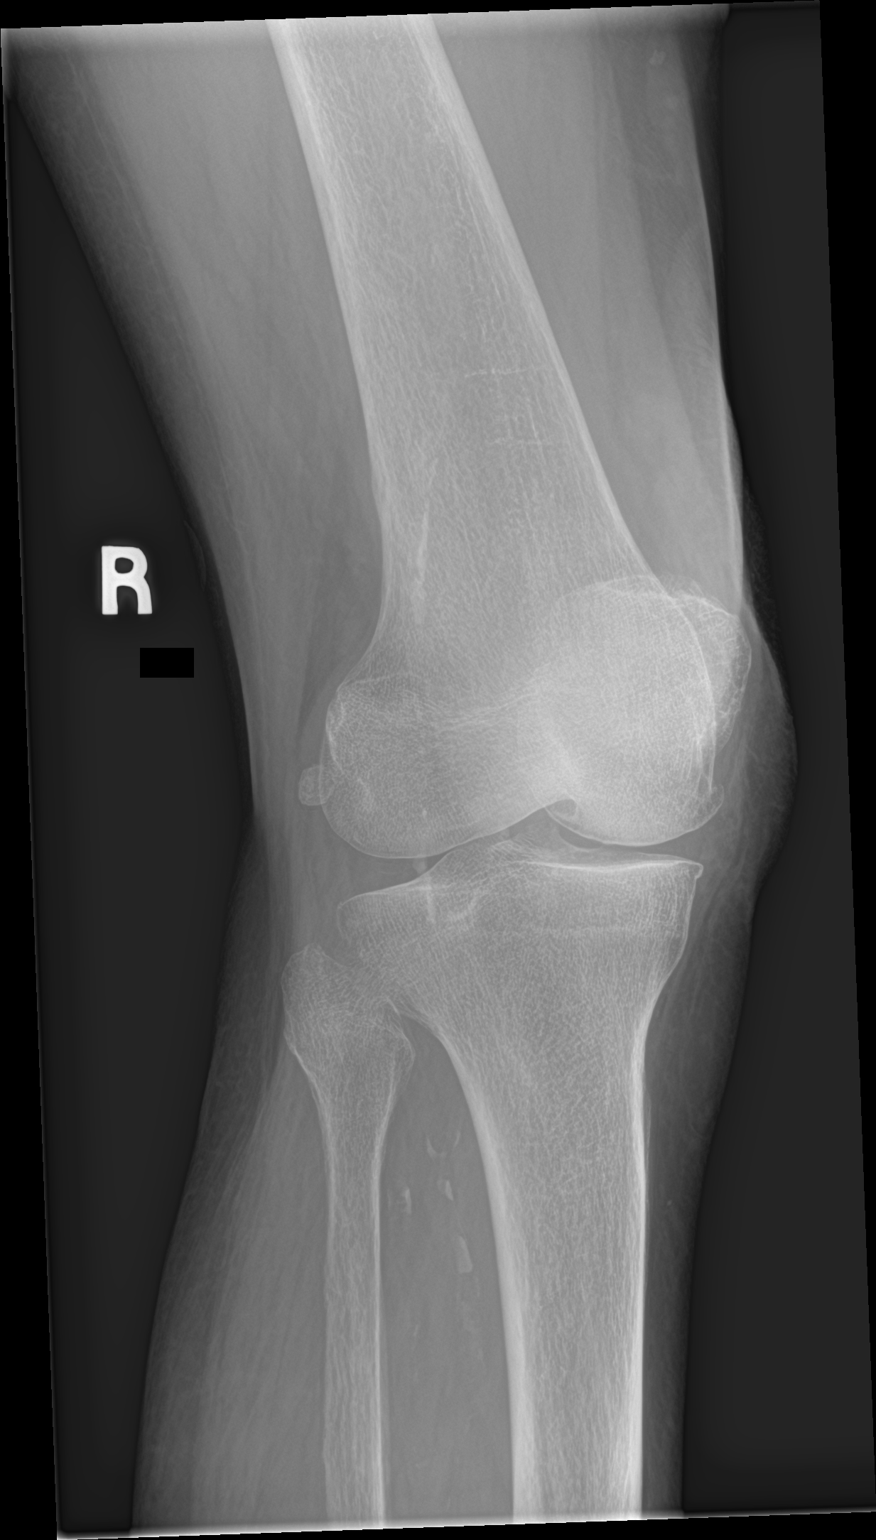

[knee obl (2 of 2)]
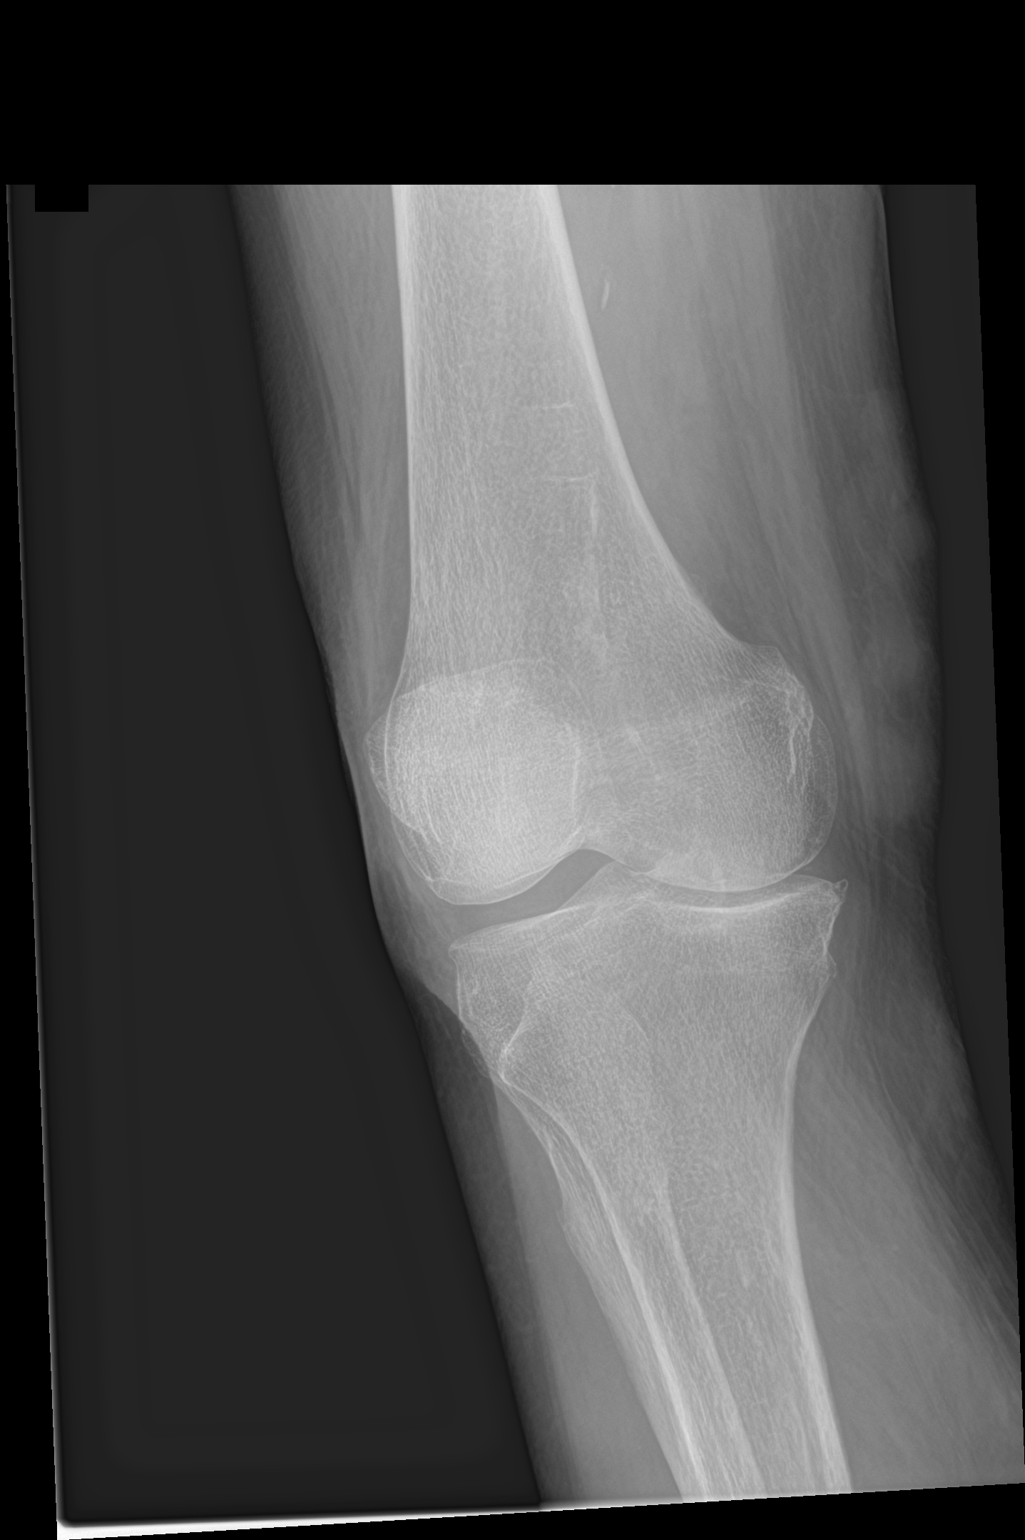

[knee lat]
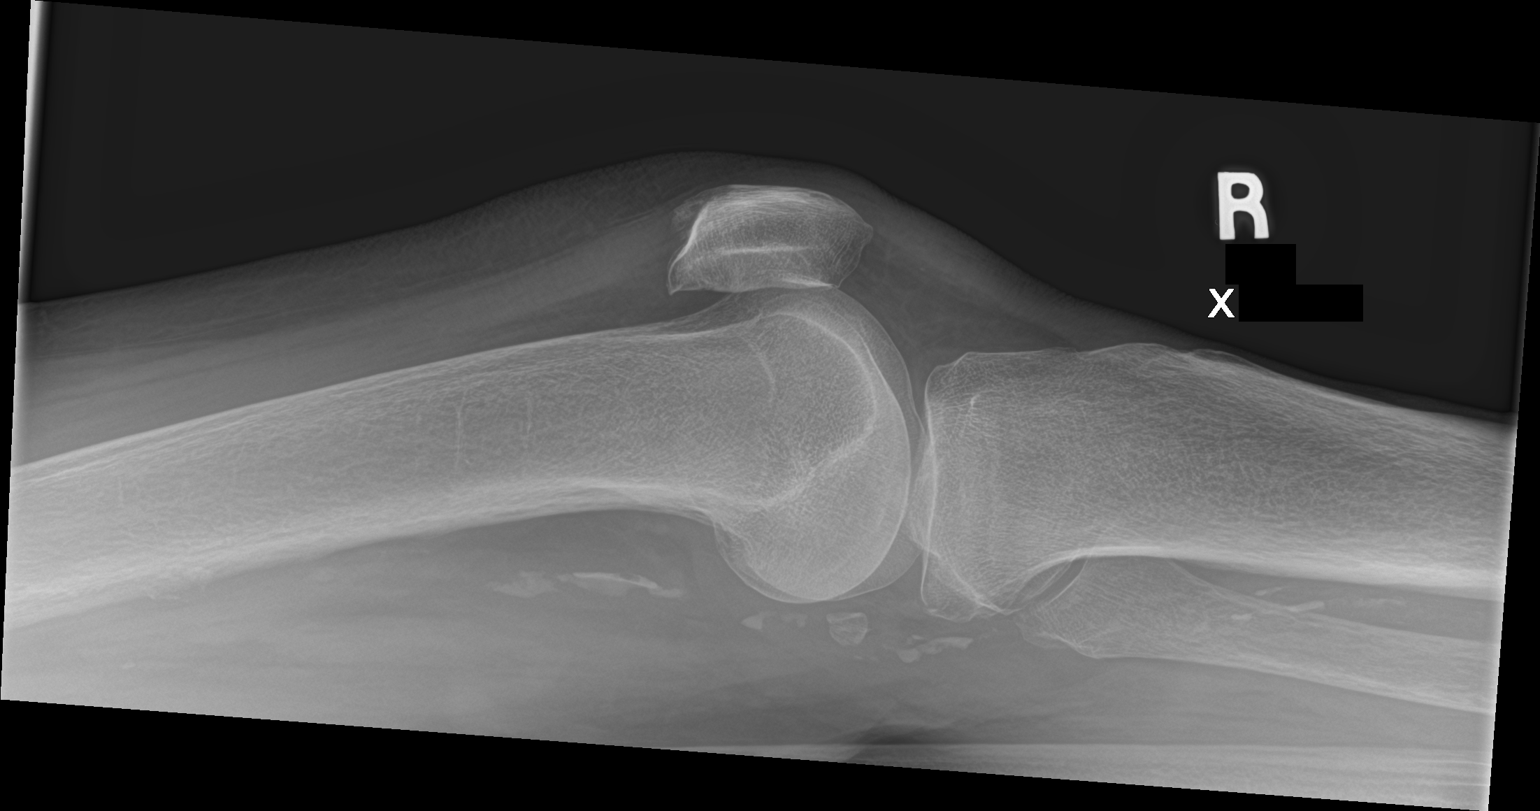

[4 of 4 positions shown; findings below may reference images not displayed]

FINDINGS: No right knee fracture, joint effusion or dislocation. No suspicious
focal osseous lesions. Small superior right patellar enthesophyte.
Minimal tricompartmental osteoarthritis. No radiopaque foreign body.
IMPRESSION: No right knee fracture, joint effusion or dislocation. Minimal
tricompartmental right knee osteoarthritis.

## 2018-12-27 MED ORDER — ACETAMINOPHEN 500 MG PO TABS
500.0000 mg | ORAL_TABLET | Freq: Once | ORAL | Status: AC
  Administered 2018-12-27: 500 mg via ORAL
  Filled 2018-12-27: qty 1

## 2018-12-27 NOTE — ED Provider Notes (Signed)
[facility] Emergency Department Provider Note  ____________________________________________   First MD Initiated Contact with Patient 12/27/18 [phone]     (approximate)  I have reviewed the triage vital signs and the nursing notes.  History  Chief Complaint Fall and Weakness    HPI Michele [name] is a 85 y.o. female history of hyperlipidemia, GERD, recent progressive LE weakness 2/2 spinal canal stenosis (see MRI below) who presents the emergency department for a fall.  Patient states she was transferring from her wheelchair to the commode, when her legs gave out from under her causing her to fall.  She landed on her knees.  No hit to the head, no loss of consciousness.  She denies any preceding chest pain, shortness of breath, lightheadedness, or dizziness.  She states over the last month she has progressive lower extremity weakness.  1 month ago she was able to 10 to all her ADLs, and now she requires a wheelchair because her lower extremities are so weak.  She denies any numbness or tingling.  No issues with bowel or bladder control.     MRI Lumbar Spine from 12/23/18 IMPRESSION: 1. Significant progression of fracture T12 compared with the prior MRI of 10/22/2018. Progression of retropulsion of T12 into the canal now with moderate spinal stenosis which has progressed significantly. 2. No other new findings compared with the prior MRI.    Past Medical Hx Past Medical History:  Diagnosis Date  . Anemia    yrs ago  . Arthritis   . Back pain   . Carotid bruit    right side more so than left;but per pt not enough to clean out  . Constipation    metamucil bid  . Diverticulosis    right  . GERD (gastroesophageal reflux disease)    related to some meds;but doesn't take any medications  . History of migraine    stopped with menopause  . Hyperlipidemia    takes crestor daily  . Hypertension    takes Lisinopril daily  . Hypothyroidism    takes Synthroid daily  . Internal hemorrhoids   . Joint pain   . Joint swelling   . Macular degeneration    dry  . Nocturia   . Numbness    left foot  . Osteopenia   . Phlebitis    hx of-40yrs ago right leg;takes Plavix daily but on hold for surgery  . PONV (postoperative nausea and vomiting)   . Urinary frequency   . Urinary urgency     Problem List Patient Active Problem List   Diagnosis Date Noted  . Chest pain 09/10/2016    Past Surgical Hx Past Surgical History:  Procedure Laterality Date  . BREAST BIOPSY Right 12/1997   neg  . BREAST CYST ASPIRATION Right 06/21/1954   rt fna- milk duct-neg  . COLONOSCOPY    . DILATION AND CURETTAGE OF UTERUS    . OOPHORECTOMY Bilateral   . removal of breast cyst  1998  . removal of neck cyst  1996  . removal of tubes and ovaries      Medications Prior to Admission medications   Medication Sig Start Date End Date Taking? Authorizing Provider  lisinopril (ZESTRIL) 10 MG tablet Take 10 mg by mouth daily. 12/06/18  Yes [provider]  potassium chloride (K-DUR) 10 MEQ tablet Take 10 mEq by mouth daily. 12/16/18  Yes [provider]  SYNTHROID 100 MCG tablet Take 100 mcg by mouth daily. 10/11/18  Yes  [provider]  clopidogrel (PLAVIX) 75 MG tablet Take 75 mg by mouth at bedtime.    [provider]  Multiple Vitamins-Minerals (PRESERVISION AREDS 2 PO) Take 1 capsule by mouth 2 (two) times daily.    [provider]  pantoprazole (PROTONIX) 40 MG tablet Take 1 tablet (40 mg total) by mouth daily before breakfast. 09/11/16   Hillary Bow, MD  rosuvastatin (CRESTOR) 5 MG tablet Take 5 mg by mouth at bedtime.    [provider]    Allergies Aspirin, Codeine, Morphine and related, Other, Scallops [shellfish allergy], and Sulfa antibiotics  Family Hx Family History  Problem Relation Age of Onset  . Heart failure Mother   . Heart failure Father   . Cancer Sister   . Breast cancer  Sister 9  . Cancer Brother   . Breast cancer Cousin        3 pat cousins    Social Hx Social History   Tobacco Use  . Smoking status: Never Smoker  . Smokeless tobacco: Never Used  Substance Use Topics  . Alcohol use: No  . Drug use: No     Review of Systems  Constitutional: Negative for fever. Negative for chills. Eyes: Negative for visual changes. ENT: Negative for sore throat. Cardiovascular: Negative for chest pain. Respiratory: Negative for shortness of breath. Gastrointestinal: Negative for abdominal pain. Negative for nausea. Negative for vomiting. Genitourinary: Negative for dysuria. Musculoskeletal: Positive for bilateral knee pain. Skin: Negative for rash. Neurological: Positive for lower extremity weakness.   Physical Exam  Vital Signs: ED Triage Vitals  Enc Vitals Group     BP --      Pulse --      Resp --      Temp --      Temp src --      SpO2 --      Weight 12/27/18 0906 132 lb (59.9 kg)     Height 12/27/18 0906 5\' 5"  (1.651 m)     Head Circumference --      Peak Flow --      Pain Score 12/27/18 0856 0     Pain Loc --      Pain Edu? --      Excl. in [place]? --     Constitutional: Alert and oriented.  Eyes: Conjunctivae clear. Sclera anicteric. Head: Normocephalic. Atraumatic. Nose: No congestion. No rhinorrhea. Mouth/Throat: Mucous membranes are moist.  Neck: No stridor.  No midline C-spine tenderness. Cardiovascular: Normal rate, regular rhythm. No murmurs. Extremities well perfused. Chest: Chest wall stable and nontender.  No crepitance. Respiratory: Normal respiratory effort.  Lungs CTAB. Gastrointestinal: Soft and non-tender. No distention.  Pelvis: Stable with AP and lateral compression. Musculoskeletal: Full range of motion to bilateral shoulders, elbows, wrists, hips, knees, ankles. Some discomfort with knee ranging, but able to flex and extend thru full ROM. Neurologic:  Normal speech and language. UE 5/5 and symmetric. Hip flexors  4/5, remainder of lower extremity strength grossly intact. Skin: Abrasions to bilateral anterior knees. Psychiatric: Mood and affect are appropriate for situation.  EKG  Personally reviewed.   Rate: 83 Rhythm: Sinus Axis: Normal Intervals: Within normal limits No acute ST or T wave changes.  No acute ischemia.   Radiology  XR knees: No acute fractures  Procedures  Procedure(s) performed (including critical care):  Procedures   Initial Impression / Assessment and Plan / ED Course  85 y.o. female with history as above who presents to the ED for a  fall upon transferring from wheelchair to commode, with resultant bilateral knee pain.  Patient has progressive lower extremity weakness over the last month or so.  MRI obtained on 8/4 reveals significant canal stenosis.  Plan: basic labs, x-ray knees, discuss recent MRI with spine.  On arrival of daughter at bedside, she states family and patient are aware of the MRI findings.  At this time, they do not wish to pursue surgical or operative intervention.  They are considering a trial of steroids with her PCP.  They are aware that the stenosis is causing her lower extremity weakness, and may likely continue to contribute to falls.  Labs here without actionable derangements, no evidence of UTI, significant electrolyte abnormality or anemia.  X-ray knees are negative.  EKG without acute ischemia or arrhythmia.  Suspect patient's fall is likely related to her spinal stenosis and related weakness.  No other contributing abnormalities identified on work-up.  We will plan for discharge back to her facility.  Patient and the daughter voiced understanding and are comfortable with plan.    Final Clinical Impression(s) / ED Diagnosis  Final diagnoses:  Weakness  Fall, initial encounter      Note:  This document was prepared using Dragon voice recognition software and may include unintentional dictation errors.   Lilia Pro., MD  12/27/18 [phone]

## 2018-12-27 NOTE — ED Notes (Signed)
Patient transported to X-ray 

## 2018-12-27 NOTE — ED Notes (Signed)
Pt's daughter a bedside. Pt receives PT and OT at home for the weakness. Pt has refused back surgery.

## 2018-12-27 NOTE — ED Notes (Signed)
Pt leaving with EMS. NAD. VSS. Alert and oriented.

## 2018-12-27 NOTE — ED Notes (Signed)
Waiting on ride to facility. No needs at this time.

## 2018-12-27 NOTE — ED Notes (Signed)
In and out cath performed. Sterile technique maintained.

## 2018-12-27 NOTE — Discharge Instructions (Addendum)
Thank you for letting us take care of you in the emergency department today.  ° °Please continue to take your regular, prescribed medications.  ° °Please follow up with your primary care doctor to review your ER visit and follow up on your symptoms.  ° °Please return to the ER for any new or worsening symptoms.  ° °

## 2018-12-27 NOTE — ED Triage Notes (Signed)
Pt has had worsening weakness over past couple weeks since seen here and dx with UTI.  Pt had fall today r/t weakness. Before all this weakness started pt was able to walk and care for self and do all ADL. Lately pt has been wheel chair bound.

## 2018-12-27 NOTE — ED Notes (Signed)
Offered pt something to eat/drink while waiting. Declined at this time.

## 2018-12-27 NOTE — ED Notes (Signed)
Pt waiting on EMS for discharge.

## 2018-12-29 ENCOUNTER — Encounter
Admission: RE | Admit: 2018-12-29 | Discharge: 2018-12-29 | Disposition: A | Discharge status: Home / Self Care | Payer: Medicare HMO | Source: Ambulatory Visit | Attending: Internal Medicine | Admitting: Internal Medicine

## 2019-01-20 ENCOUNTER — Encounter
Admission: RE | Admit: 2019-01-20 | Discharge: 2019-01-20 | Disposition: A | Discharge status: Home / Self Care | Payer: Medicare HMO | Source: Ambulatory Visit | Attending: Internal Medicine | Admitting: Internal Medicine

## 2019-02-19 ENCOUNTER — Encounter
Admission: RE | Admit: 2019-02-19 | Discharge: 2019-02-19 | Disposition: A | Discharge status: Home / Self Care | Payer: Medicare HMO | Source: Ambulatory Visit | Attending: Internal Medicine | Admitting: Internal Medicine

## 2019-03-20 ENCOUNTER — Other Ambulatory Visit: Payer: Self-pay | Admitting: Student

## 2019-03-20 DIAGNOSIS — S22000S Wedge compression fracture of unspecified thoracic vertebra, sequela: Secondary | ICD-10-CM

## 2019-03-30 ENCOUNTER — Encounter
Admission: RE | Admit: 2019-03-30 | Discharge: 2019-03-30 | Disposition: A | Discharge status: Home / Self Care | Payer: Medicare HMO | Source: Ambulatory Visit | Attending: Internal Medicine | Admitting: Internal Medicine

## 2019-04-06 ENCOUNTER — Other Ambulatory Visit: Payer: Self-pay

## 2019-04-06 ENCOUNTER — Ambulatory Visit
Admission: RE | Admit: 2019-04-06 | Discharge: 2019-04-06 | Disposition: A | Discharge status: Home / Self Care | Payer: Medicare HMO | Source: Ambulatory Visit | Attending: Student | Admitting: Student

## 2019-04-06 DIAGNOSIS — S22000S Wedge compression fracture of unspecified thoracic vertebra, sequela: Secondary | ICD-10-CM | POA: Diagnosis not present

## 2019-04-06 IMAGING — MR MR THORACIC SPINE W/O CM
6 series · 29 of 48 positions shown · non-contrast
Comparison: Lumbar spine MRI [DATE]. CTA chest [DATE].

CLINICAL DATA: Thoracic compression fracture. Upper back pain since
[REDACTED].

EXAM:
MRI THORACIC SPINE WITHOUT CONTRAST
TECHNIQUE: Multiplanar, multisequence MR imaging of the thoracic spine was
performed. No intravenous contrast was administered.

[Series 16: T1 · sagittal · 5.0mm · 1.88mm/px · 2 of 9 slices shown (1 of 2)]
[im 1/9]
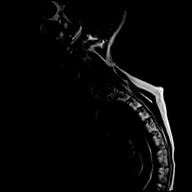
[im 9/9]
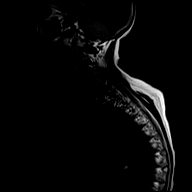

[Series 17: T2 · sagittal · 3.0mm · 1.06mm/px · 6 of 17 slices shown (1 of 2)]
[im 1/17]
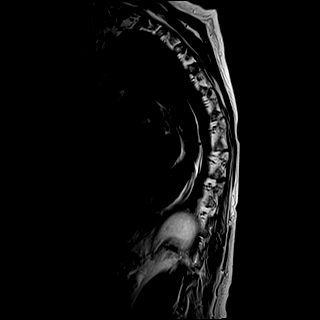
[im 4/17]
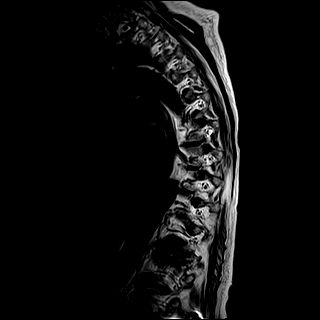
[im 7/17]
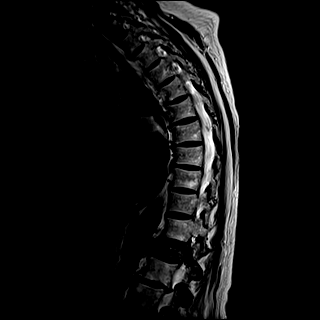
[im 10/17]
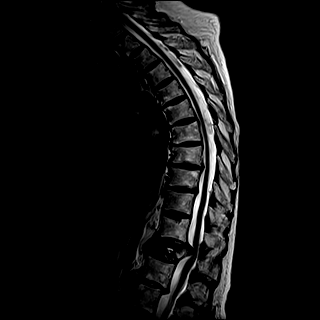
[im 13/17]
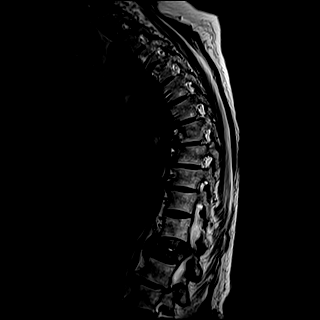
[im 17/17]
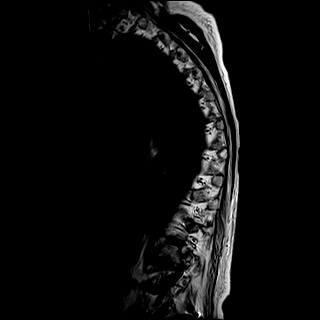

[Series 18: T1 · sagittal · 3.0mm · 1.06mm/px · 6 of 17 slices shown (2 of 2)]
[im 1/17]
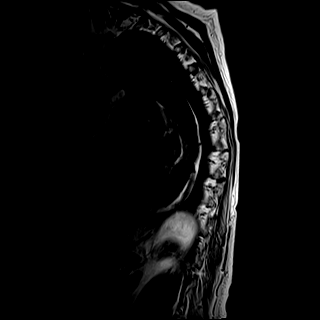
[im 4/17]
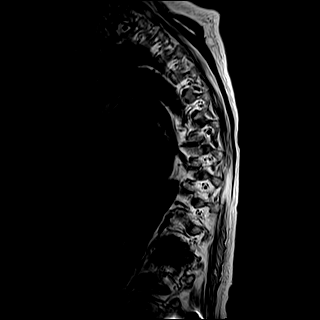
[im 7/17]
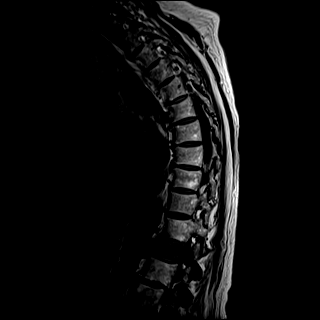
[im 10/17]
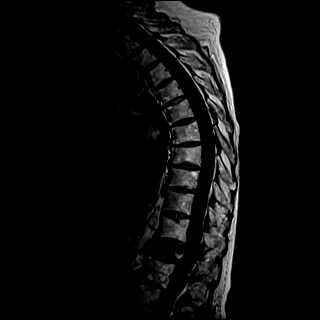
[im 13/17]
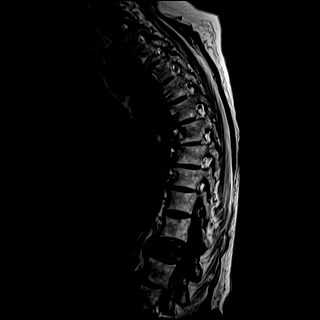
[im 17/17]
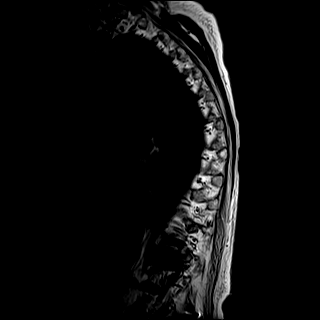

[Series 19: STIR · sagittal · 3.0mm · 0.53mm/px · 6 of 17 slices shown]
[im 1/17]
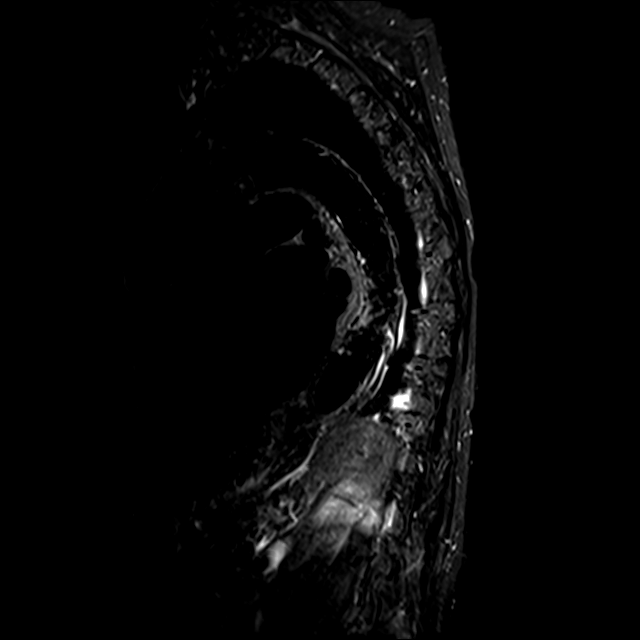
[im 4/17]
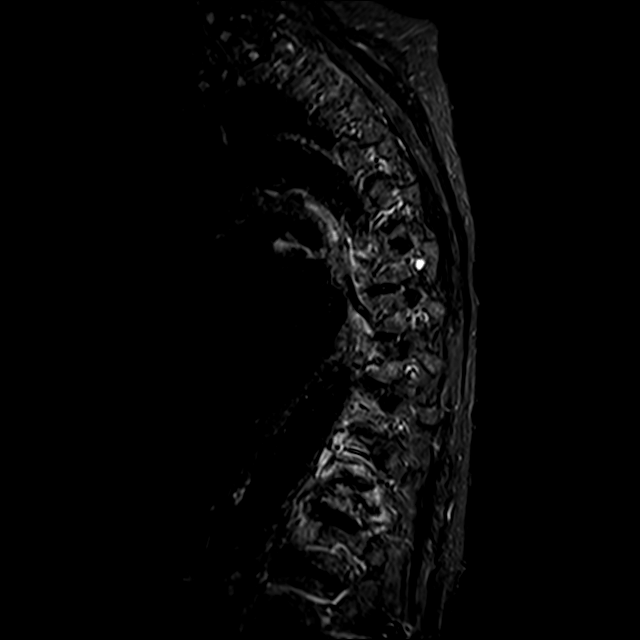
[im 7/17]
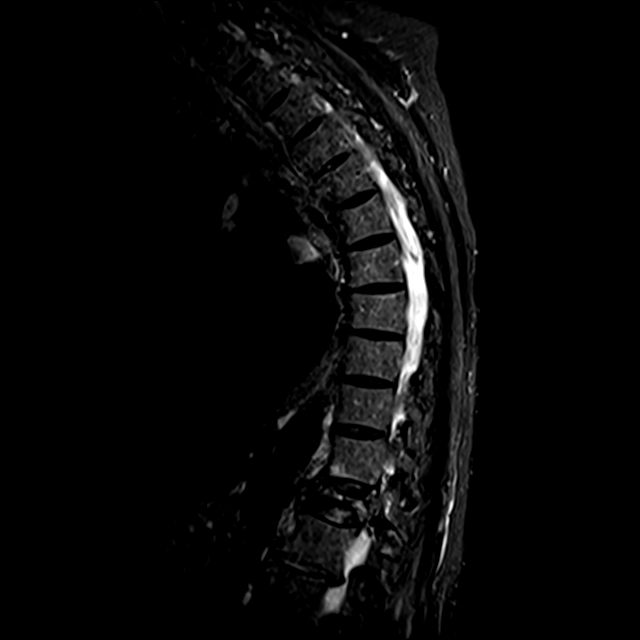
[im 10/17]
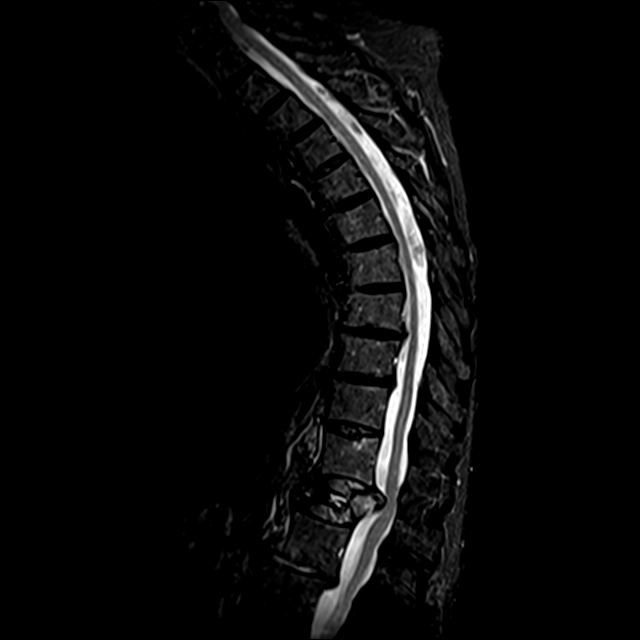
[im 13/17]
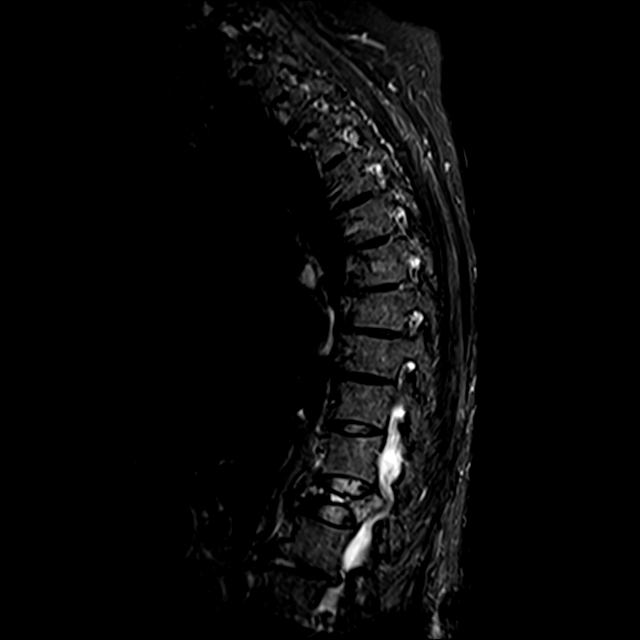
[im 17/17]
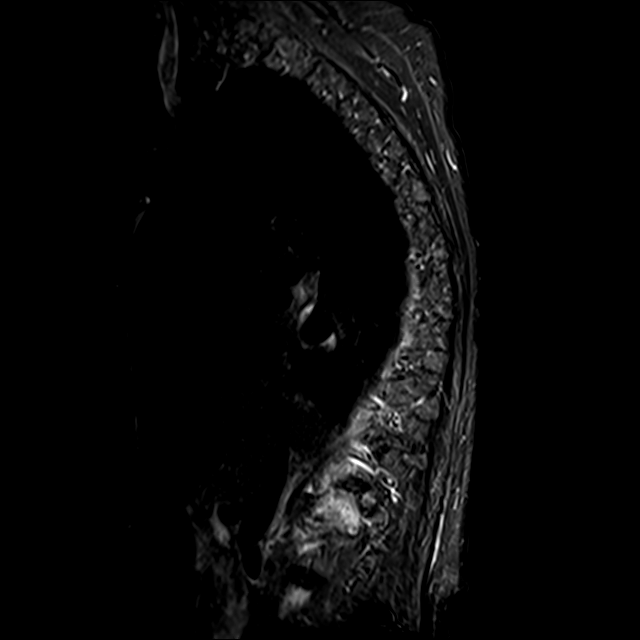

[Series 20: T2 · axial · 4.0mm · 0.59mm/px · z∈[-266,-79]mm · 8 of 40 slices shown (2 of 2)]
[im 1/40]
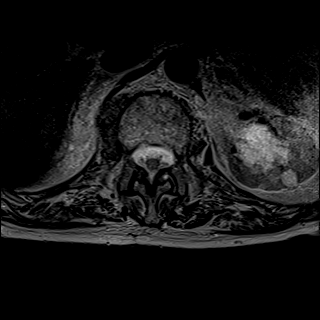
[im 7/40]
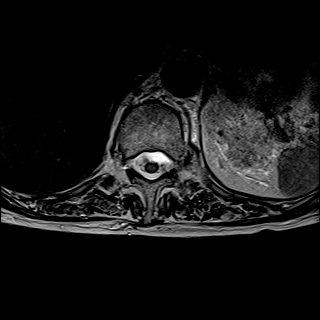
[im 13/40]
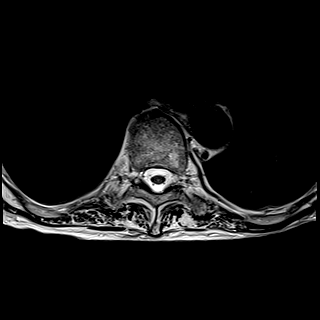
[im 19/40]
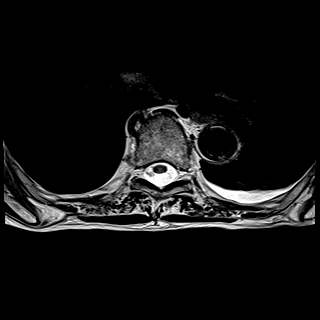
[im 22/40]
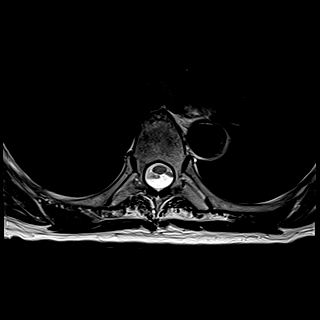
[im 28/40]
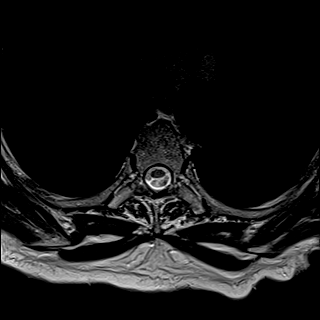
[im 34/40]
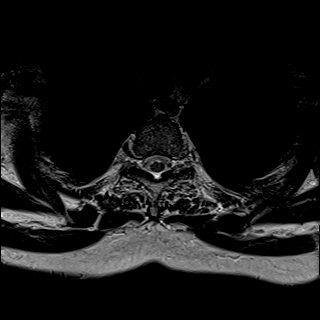
[im 40/40]
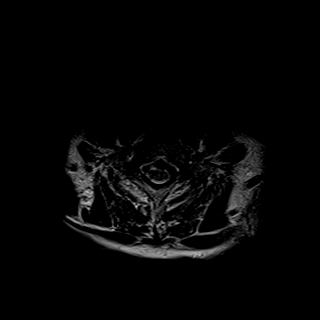

[Series 21: GRE · axial · 4.0mm · 0.37mm/px · 1 of 40 slices shown]
[im 1/40]
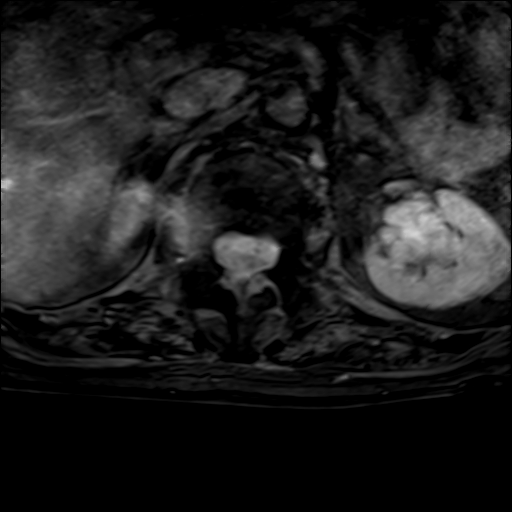

[29 of 48 positions shown; findings below may reference images not displayed]

FINDINGS: Alignment: Unchanged grade 1 retrolisthesis of L1 on L2. Exaggerated
thoracic kyphosis.

Vertebrae: T12 fracture with progressive, severe vertebral body
height loss now measuring 85%. Progressive T12 retropulsion now
measuring 1 cm with progressive, severe spinal stenosis. Persistent
moderate T12 marrow edema. No thoracic spine fracture elsewhere.
Small T11 superior endplate Schmorl's node. No suspicious marrow
lesion. Mild multilevel degenerative endplate changes.

Cord: Moderate cord compression at T12 due to retropulsed bone. No
evidence of cord edema.

Paraspinal and other soft tissues: Mild paravertebral soft tissue
edema at T12. Partially visualized left renal cysts.

Disc levels:

Partially visualized advanced disc space narrowing at C6-7 on
sagittal images with grade 1 retrolisthesis and likely at least mild
spinal stenosis. Severe spinal stenosis at T12 as described above.
Small to moderate-sized central disc protrusion at T8-9 and tiny
central disc protrusion at T9-10 with mild ventral cord flattening
at the former but no significant stenosis. Moderate bilateral neural
foraminal stenosis at T12-L1.
IMPRESSION: 1. T12 fracture with progressive, severe vertebral body height loss
and retropulsion now resulting in severe spinal stenosis and
moderate cord compression without cord edema.
2. Moderate bilateral neural foraminal stenosis at T12-L1.
3. Mild thoracic spondylosis elsewhere without stenosis.

These results will be called to the ordering clinician or
representative by the Radiologist Assistant, and communication
documented in the PACS or zVision Dashboard.

## 2019-04-14 ENCOUNTER — Telehealth: Payer: Self-pay | Admitting: Adult Health Nurse Practitioner

## 2019-04-14 NOTE — Telephone Encounter (Signed)
Spoke with daughter about her mother's referral to palliative.  Answered questions.  She felt comfortable proceeding with palliative and will talk to her mother about palliative services prior to my first visit after the Thanksgiving holiday. Colbin Jovel K. Olena Heckle NP

## 2019-04-22 ENCOUNTER — Other Ambulatory Visit: Payer: Self-pay

## 2019-04-22 ENCOUNTER — Non-Acute Institutional Stay: Payer: Medicare HMO | Admitting: Adult Health Nurse Practitioner

## 2019-04-22 DIAGNOSIS — M48061 Spinal stenosis, lumbar region without neurogenic claudication: Secondary | ICD-10-CM

## 2019-04-22 DIAGNOSIS — Z515 Encounter for palliative care: Secondary | ICD-10-CM

## 2019-04-22 NOTE — Progress Notes (Signed)
[facility] Consult Note Telephone: [phone]  Fax: [phone]  PATIENT NAME: Michele [name] DOB: 1927/06/25 MRN: [id number]  PRIMARY CARE PROVIDER:   Dr. Frazier Richards  REFERRING PROVIDER: Dr. Frazier Richards  RESPONSIBLE PARTY:   Self Daughter, Michele [id number]  C: [phone]     RECOMMENDATIONS and PLAN:  1.  Advanced care planning.  Patient is a DNR as indicated from her Face Sheet at facility.  Did not have ACP discussion today.  2.  T12 fracture.  Patient started having back pain June of this year.  Was found to have a T12 fracture with 20% loss of height.  Conservative treatment recommended and she was sent home with a TLSO orthotic brace.  In August she had increased weakness and a fall and MRI showed progression of retropulsion of T12 itno the canal with moderate spinal stenosis.  Family found her and her husband placement at [facility] at that time.  She continues to wear the TLSO brace when out of bed.  She states that she has pain that starts on left groin that radiates across her pelvis.  States that most days it is manageable but that it does hurt more with changing positions.  States that as long as she has her Tylenol 500 mg Q4 hrs that the pain is not too bad and that when she is not moving at times she does not have pain at all. Also states that ice packs help. She does state that a day or 2 ago she had severe pain that brought her to tears.  States that she was on the commode having a BM when the pain started.  Xrays were done and she was found to have a large amount of stool in her bowel.  She states that she had been constipated but had been having daily BMs for a while. She was started on a stool softener and is encouraged to eat fiber and drink plenty of water. She said this has only happened once and with her pain otherwise being managed will continue to monitor for worsening pain.  She states she  does not tolerate opioids well and does not want them.  Surgery is not an option due to her age.  If she starts having persistent severe episodes of pain, she may need to be referred to a pain specialist who may have other options for her the pain such as joint injections or TENS.  For now continue the scheduled Tylenol and ice packs as needed as this does seem to give her relief.    2.  Depression/Anxiety.  Denies any anxiety or depression.  Talked about her church and she was tearful when talking about the passing of one her pastors, Michele [name].  States that she has not been able to see Michele [name] from her church due to no visitors are allowed and when he can call in the evenings she and her husband are having devotion together.  She is interested in having one of our chaplains give her a call.  Have reached out to SW with this request.    Have left VM with daughter to update on visit.  Palliative will continue to monitor every 4-6 weeks for symptom management and make recommendations as needed.  I spent 60 minutes providing this consultation,  from 10:15 to 11:15. More than 50% of the time in this consultation was spent coordinating communication.   HISTORY OF PRESENT ILLNESS:  Michele  Cerutti [name] is a 85 y.o. year old female with multiple medical problems including lumbar spinal stenosis, macular degeneration,HTN, hypothyroidism. Palliative Care was asked to help address goals of care.   CODE STATUS: DNR  PPS: 50% HOSPICE ELIGIBILITY/DIAGNOSIS: TBD  PHYSICAL EXAM:   General: NAD, frail appearing, thin Extremities: no edema, no joint deformities Skin: no rashes Neurological: Weakness but otherwise nonfocal   PAST MEDICAL HISTORY:  Past Medical History:  Diagnosis Date   Anemia    yrs ago   Arthritis    Back pain    Carotid bruit    right side more so than left;but per pt not enough to clean out   Constipation    metamucil bid   Diverticulosis    right   GERD  (gastroesophageal reflux disease)    related to some meds;but doesn't take any medications   History of migraine    stopped with menopause   Hyperlipidemia    takes crestor daily   Hypertension    takes Lisinopril daily   Hypothyroidism    takes Synthroid daily   Internal hemorrhoids    Joint pain    Joint swelling    Macular degeneration    dry   Nocturia    Numbness    left foot   Osteopenia    Phlebitis    hx of-73yrs ago right leg;takes Plavix daily but on hold for surgery   PONV (postoperative nausea and vomiting)    Urinary frequency    Urinary urgency     SOCIAL HX:  Social History   Tobacco Use   Smoking status: Never Smoker   Smokeless tobacco: Never Used  Substance Use Topics   Alcohol use: No    ALLERGIES:  Allergies  Allergen Reactions   Aspirin Other (See Comments)    Patient collapsed, was rushed to the hospital and hospitalized for a week.   Codeine Nausea And Vomiting   Morphine And Related Nausea And Vomiting   Other Nausea And Vomiting    AGENT:  anesthesia   Scallops [Shellfish Allergy] Nausea And Vomiting    Headaches   Sulfa Antibiotics Hives     PERTINENT MEDICATIONS:  Outpatient Encounter Medications as of 04/22/2019  Medication Sig   acetaminophen (TYLENOL) 500 MG tablet Take 500 mg by mouth every 4 (four) hours as needed for mild pain or fever.   Krill Oil 300 MG CAPS Take 300 mg by mouth daily.   lisinopril (ZESTRIL) 10 MG tablet Take 10 mg by mouth daily.   Multiple Vitamins-Minerals (PRESERVISION AREDS 2 PO) Take 1 capsule by mouth 2 (two) times daily.   rosuvastatin (CRESTOR) 10 MG tablet Take 10 mg by mouth at bedtime.    SYNTHROID 100 MCG tablet Take 100 mcg by mouth daily.   No facility-administered encounter medications on file as of 04/22/2019.      Virga Haltiwanger Jenetta Downer, NP

## 2019-04-28 ENCOUNTER — Telehealth: Payer: Self-pay

## 2019-04-28 ENCOUNTER — Telehealth: Payer: Self-pay | Admitting: Adult Health Nurse Practitioner

## 2019-04-28 NOTE — Telephone Encounter (Signed)
Cathlean [place], [facility] Telephonic Visit  Notified of request for a chaplain by Palliative MSW, Margaretmary Lombard, via supervisor [employer].  Chaplain called daughter, Rushia Bertz, as Azeem Poorman recommended on 12/3 and 12/4 and left messages both days.  04/27/2019 Mickel Baas retuned chaplain's call with a voicemail message, and we were able to talk later in the day.  Mickel Baas reported her mother seems to be giving up and "wondering why God is keeping her here" and she feels a chaplain would be good for her to talk with regarding these concerns.  Patient's pastor died recently so she no longer has this connection.  Mickel Baas recommended a good time to call her mother and after a second call, she gave chaplain her mother's direct number [phone]).  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    04/28/2019 Call to Mrs. Shellenbarger.  She answered and was very open to sharing with chaplain about her life and concerns.  She shared she had pain and muscle spasms with her back and it is almost unbearable at times.  She shared it makes her PT very difficult. She shared her Darrick Meigs faith is very important for her and she has great confidence God will take care of her.  Yet she shared she wished the "Lord would hurry up and smooth out all her rough places" as it is difficult at times.  Chaplain validated her feelings and provided pastoral conversation around how overwhelming it can feel at times.  She has several favorite Bible passages that help her and chaplain offered another image of resilience from nature, God's creation.  She expressed thanks for this support and requested chaplain call her again.  Planned a call for next week at a similar time of day. Prayer with MRs. Swartzentruber around her shared concerns for her husband, and grandchildren.  Mrs. Clotfelter indicated supporting her husband is   part of why she isn't ready to give up at this time.  Called Mickel Baas and left a follow up message as she had requested.  Reported to the team.

## 2019-04-28 NOTE — Telephone Encounter (Signed)
Returned daughter's VM about patient's pain.  Left VM with contact to call back to discuss further. Unique Sillas K. Olena Heckle NP

## 2019-05-04 ENCOUNTER — Telehealth: Payer: Self-pay | Admitting: Adult Health Nurse Practitioner

## 2019-05-04 NOTE — Telephone Encounter (Signed)
Left VM with daughter about patient's pain in back.  Explained that due to patient unable to tolerate opioids will reach out to Dr. Ouida Sills to see if he would like to make referral to pain clinic for other options.  Left contact info for call back with any questions. Izayah Miner K. Olena Heckle NP

## 2019-05-06 ENCOUNTER — Telehealth: Payer: Self-pay

## 2019-05-06 NOTE — Telephone Encounter (Signed)
At the direction of Amy NP, phone call placed to PCP office to request referral to a pain clinic as patient is unable to tolerate opioids. Spoke with Olivia Mackie, who will send message to MD.

## 2019-05-07 ENCOUNTER — Telehealth: Payer: Self-pay | Admitting: Adult Health Nurse Practitioner

## 2019-05-07 NOTE — Telephone Encounter (Signed)
Left VM with daughter about Dr. Tonette Bihari recommendation for patient to come to [facility] orthopedics to see Dr. Sharlet Salina for joint injections.  Have contacted facility with this recommendation as well. Left contact info for call back. Zyonna Vardaman K. Olena Heckle NP

## 2019-05-11 ENCOUNTER — Telehealth: Payer: Self-pay

## 2019-05-11 NOTE — Telephone Encounter (Signed)
Michele [name], [facility] Chaplain Telephonic Visit  Second phone call for spiritual support with Mrs. Michele [name].  She reported she has just completed her PT for the day and is back in bed to rest, suggesting this is a good time to talk. 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  She reported she is no longer able to meet with her husband in-person or interact with their friends.  Chaplain suggested ways she and her husband can navigate this time using the phone to continue their routine including their nightly devotions together.  Explored her concerns about contracting COVID and she indicated it is all in God's hands either way expressing a predestination view of God and her life circumstances.  She finds comfort in this view as ultimately all brings her to God.  She shared how important faith is for her family and children and had some questions about the [facility] tradition, which this Probation officer is ordained through, as her grandchildren are formed in this tradition.  Explored this with her and provided some theological education around some of her questions. She reported she is improving and growing stronger with her PT.  She shared concerns about her family and their lives.  Mainly grandson, who is interviewing for medical internships right now, and granddaughter, who is working to publish for her dissertation.  She sounds in good spirits overall and her engagement with her family and their concerns do not suggest she is "giving up" in any way.  Prayer around stated needs.  Tentatively, set up a check-in with her for early January.  Reviewed how to contact chaplain if needs arise in the meantime.  Michele [name] thanked Clinical biochemist for the call and support today.   Leslie Burrus, MDIV, [facility]

## 2019-07-09 ENCOUNTER — Encounter
Admission: RE | Admit: 2019-07-09 | Discharge: 2019-07-09 | Disposition: A | Discharge status: Home / Self Care | Payer: Medicare HMO | Source: Ambulatory Visit | Attending: Internal Medicine | Admitting: Internal Medicine

## 2019-07-14 ENCOUNTER — Other Ambulatory Visit
Admission: RE | Admit: 2019-07-14 | Discharge: 2019-07-14 | Disposition: A | Discharge status: Home / Self Care | Payer: Medicare HMO | Source: Ambulatory Visit | Attending: Internal Medicine | Admitting: Internal Medicine

## 2019-07-14 DIAGNOSIS — E876 Hypokalemia: Secondary | ICD-10-CM | POA: Insufficient documentation

## 2019-07-14 DIAGNOSIS — R5383 Other fatigue: Secondary | ICD-10-CM | POA: Insufficient documentation

## 2019-07-14 DIAGNOSIS — E785 Hyperlipidemia, unspecified: Secondary | ICD-10-CM | POA: Insufficient documentation

## 2019-07-14 DIAGNOSIS — E039 Hypothyroidism, unspecified: Secondary | ICD-10-CM | POA: Diagnosis not present

## 2019-07-14 LAB — CBC WITH DIFFERENTIAL/PLATELET
Abs Immature Granulocytes: 0.01 10*3/uL (ref 0.00–0.07)
Basophils Absolute: 0.1 10*3/uL (ref 0.0–0.1)
Basophils Relative: 1 %
Eosinophils Absolute: 0.2 10*3/uL (ref 0.0–0.5)
Eosinophils Relative: 3 %
HCT: 34.6 % — ABNORMAL LOW (ref 36.0–46.0)
Hemoglobin: 11.2 g/dL — ABNORMAL LOW (ref 12.0–15.0)
Immature Granulocytes: 0 %
Lymphocytes Relative: 37 %
Lymphs Abs: 2 10*3/uL (ref 0.7–4.0)
MCH: 30.7 pg (ref 26.0–34.0)
MCHC: 32.4 g/dL (ref 30.0–36.0)
MCV: 94.8 fL (ref 80.0–100.0)
Monocytes Absolute: 0.5 10*3/uL (ref 0.1–1.0)
Monocytes Relative: 9 %
Neutro Abs: 2.7 10*3/uL (ref 1.7–7.7)
Neutrophils Relative %: 50 %
Platelets: 225 10*3/uL (ref 150–400)
RBC: 3.65 MIL/uL — ABNORMAL LOW (ref 3.87–5.11)
RDW: 11.3 % — ABNORMAL LOW (ref 11.5–15.5)
WBC: 5.4 10*3/uL (ref 4.0–10.5)
nRBC: 0 % (ref 0.0–0.2)

## 2019-07-14 LAB — COMPREHENSIVE METABOLIC PANEL
ALT: 19 U/L (ref 0–44)
AST: 23 U/L (ref 15–41)
Albumin: 3.1 g/dL — ABNORMAL LOW (ref 3.5–5.0)
Alkaline Phosphatase: 49 U/L (ref 38–126)
Anion gap: 8 (ref 5–15)
BUN: 23 mg/dL (ref 8–23)
CO2: 28 mmol/L (ref 22–32)
Calcium: 9 mg/dL (ref 8.9–10.3)
Chloride: 104 mmol/L (ref 98–111)
Creatinine, Ser: 0.76 mg/dL (ref 0.44–1.00)
GFR calc Af Amer: >60 mL/min (ref >60)
GFR calc non Af Amer: >60 mL/min (ref >60)
Glucose, Bld: 77 mg/dL (ref 70–99)
Potassium: 4.4 mmol/L (ref 3.5–5.1)
Sodium: 140 mmol/L (ref 135–145)
Total Bilirubin: 0.6 mg/dL (ref 0.3–1.2)
Total Protein: 5.7 g/dL — ABNORMAL LOW (ref 6.5–8.1)

## 2019-07-14 LAB — TSH: TSH: 0.56 u[IU]/mL (ref 0.350–4.500)

## 2019-07-25 DIAGNOSIS — I214 Non-ST elevation (NSTEMI) myocardial infarction: Secondary | ICD-10-CM

## 2019-07-25 HISTORY — DX: Non-ST elevation (NSTEMI) myocardial infarction: I21.4

## 2019-08-14 ENCOUNTER — Other Ambulatory Visit: Payer: Self-pay

## 2019-08-14 ENCOUNTER — Non-Acute Institutional Stay: Payer: Medicare HMO | Admitting: Adult Health Nurse Practitioner

## 2019-08-14 DIAGNOSIS — Z515 Encounter for palliative care: Secondary | ICD-10-CM

## 2019-08-14 DIAGNOSIS — M48061 Spinal stenosis, lumbar region without neurogenic claudication: Secondary | ICD-10-CM

## 2019-08-14 NOTE — Progress Notes (Signed)
[facility] Consult Note Telephone: [phone]  Fax: [phone]  PATIENT NAME: Michele [name] DOB: [birth year] MRN: [id number]  PRIMARY CARE PROVIDER:  Dr. Frazier Richards  REFERRING PROVIDER: Dr. Frazier Richards  RESPONSIBLE PARTY:    Self Daughter, Kirstee [id number]  C: [phone]     RECOMMENDATIONS and PLAN:  1.  Advanced care planning.  Patient is a DNR.  Left VM with daughter to update on visit.  2. Pain.  Patient has had T12 fracture and chronic hip pain.  States that she has improved with her mobility and is now able to help stand and pivot with transfers. She is able to take herself to the bathroom now.  Is getting good pain relief with lidocaine patches and Tylenol.  Continue current pain regimen.  3. Edema.  States that she has been having puffiness in lower legs over the past month.  Denies increased SOB or cough, PND, orthopnea.  States that the swelling does go down when she props her feet up.  She does sit in dependent position most of the day.  Have encouraged her to prop up her feet a few times throughout the day.  Did discuss possibly adding TED hose in the future if the swelling gets worse.    Overall patient is stable.  Staff has no new concerns. Appetite good and weight stable. Current weight is 118.4.  Denies headaches, dizziness, N/V/D, constipation.  No falls, infection, or hospital visits since last visit.  Palliative will continue to monitor every 4-6 weeks for symptom management and make recommendations as needed.   I spent 30 minutes providing this consultation,  from 10:15 to 10:45 including time with patient/family, chart review, provider coordination, and documentation. More than 50% of the time in this consultation was spent coordinating communication.   HISTORY OF PRESENT ILLNESS:  Michele [name] is a 86 y.o. year old female with multiple medical problems including  lumbar spinal stenosis, macular degeneration,HTN, hypothyroidism. Palliative Care was asked to help address goals of care.   CODE STATUS: DNR  PPS: 50% HOSPICE ELIGIBILITY/DIAGNOSIS: TBD  PHYSICAL EXAM:   General: NAD, frail appearing, thin Cardiovascular: regular rate and [id number] systolic murmur heard Pulmonary: lung sounds clear; normal respiratory effort Abdomen: soft, nontender, + bowel sounds GU: no suprapubic tenderness Extremities: trace edema to bilateral lower legs, no joint deformities Skin: no rashes on exposed skin Neurological: Weakness but otherwise nonfocal  PAST MEDICAL HISTORY:  Past Medical History:  Diagnosis Date  . Anemia    yrs ago  . Arthritis   . Back pain   . Carotid bruit    right side more so than left;but per pt not enough to clean out  . Constipation    metamucil bid  . Diverticulosis    right  . GERD (gastroesophageal reflux disease)    related to some meds;but doesn't take any medications  . History of migraine    stopped with menopause  . Hyperlipidemia    takes crestor daily  . Hypertension    takes Lisinopril daily  . Hypothyroidism    takes Synthroid daily  . Internal hemorrhoids   . Joint pain   . Joint swelling   . Macular degeneration    dry  . Nocturia   . Numbness    left foot  . Osteopenia   . Phlebitis    hx of-68yrs ago right leg;takes Plavix daily but on hold for surgery  . PONV (postoperative  nausea and vomiting)   . Urinary frequency   . Urinary urgency     SOCIAL HX:  Social History   Tobacco Use  . Smoking status: Never Smoker  . Smokeless tobacco: Never Used  Substance Use Topics  . Alcohol use: No    ALLERGIES:  Allergies  Allergen Reactions  . Aspirin Other (See Comments)    Patient collapsed, was rushed to the hospital and hospitalized for a week.  . Codeine Nausea And Vomiting  . Morphine And Related Nausea And Vomiting  . Other Nausea And Vomiting    AGENT:  anesthesia  . Scallops  [Shellfish Allergy] Nausea And Vomiting    Headaches  . Sulfa Antibiotics Hives     PERTINENT MEDICATIONS:  Outpatient Encounter Medications as of 08/14/2019  Medication Sig  . acetaminophen (TYLENOL) 500 MG tablet Take 500 mg by mouth every 4 (four) hours as needed for mild pain or fever.  Javier Docker Oil 300 MG CAPS Take 300 mg by mouth daily.  Marland Kitchen lisinopril (ZESTRIL) 10 MG tablet Take 10 mg by mouth daily.  . Multiple Vitamins-Minerals (PRESERVISION AREDS 2 PO) Take 1 capsule by mouth 2 (two) times daily.  . rosuvastatin (CRESTOR) 10 MG tablet Take 10 mg by mouth at bedtime.   Marland Kitchen SYNTHROID 100 MCG tablet Take 100 mcg by mouth daily.   No facility-administered encounter medications on file as of 08/14/2019.      Hazael Olveda Jenetta Downer, NP

## 2019-09-23 ENCOUNTER — Encounter
Admission: RE | Admit: 2019-09-23 | Discharge: 2019-09-23 | Disposition: A | Discharge status: Home / Self Care | Payer: Medicare HMO | Source: Ambulatory Visit | Attending: Internal Medicine | Admitting: Internal Medicine

## 2019-09-25 ENCOUNTER — Other Ambulatory Visit: Payer: Self-pay

## 2019-09-25 ENCOUNTER — Non-Acute Institutional Stay: Payer: Medicare HMO | Admitting: Adult Health Nurse Practitioner

## 2019-09-25 DIAGNOSIS — M48061 Spinal stenosis, lumbar region without neurogenic claudication: Secondary | ICD-10-CM

## 2019-09-25 DIAGNOSIS — Z515 Encounter for palliative care: Secondary | ICD-10-CM

## 2019-09-25 NOTE — Progress Notes (Signed)
[facility] Consult Note Telephone: [phone]  Fax: [phone]  PATIENT NAME: Michele [name] DOB: [birth year] MRN: [id number]  PRIMARY CARE PROVIDER:   Dr. Frazier Richards  REFERRING PROVIDER:  Dr. Frazier Richards  RESPONSIBLE PARTY:   Self Daughter, Hamnah [id number] C: [phone]    RECOMMENDATIONS and PLAN:  1.  Advanced care planning. Patient is a DNR.  Left VM with daughter to update on visit.  2.  Pain.  Patient has had T12 fracture and chronic hip pain.  She is able to walk with walker in her room but has to have stand by assist if walking longer distances outside of her room.  If unable to have someone assist her outside her room she can use her wheelchair to get around.  Is getting good pain relief with lidocaine patches and Tylenol.  She only uses both of these as needed not everyday. Continue current pain regimen.  3.  Edema.  Today has trace edema.  States that it has been better.  Has more when she has her legs in dependent position most of the day.  The edema does go down when she props her legs up or lays down for a nap.  Denies increased SOB or cough, PND, orthopnea.  Continue to assess at each visit  Overall patient is stable.  Staff has no new concerns. Appetite good and weight has increased to  120.8.  Denies headaches, dizziness, N/V/D, constipation.  No falls, infection, or hospital visits since last visit.  Palliative will continue to monitor every 4-6 weeks for symptom management and make recommendations as needed.  I spent 40 minutes providing this consultation,  from 11:00 to 11:40 including time with patient/family, chart review, provider coordination, and documentation. More than 50% of the time in this consultation was spent coordinating communication.   HISTORY OF PRESENT ILLNESS:  Michele [name] is a 86 y.o. year old female with multiple medical problems including  lumbar spinal stenosis, macular degeneration,HTN, hypothyroidism. Palliative Care was asked to help address goals of care.   CODE STATUS: DNR  PPS: 50% HOSPICE ELIGIBILITY/DIAGNOSIS: TBD  PHYSICAL EXAM:  HR 74  O2 97% on RA General: NAD, frail appearing, thin Cardiovascular: regular rate and [id number] systolic murmur heard Pulmonary: lung sounds clear; normal respiratory effort Abdomen: soft, nontender, + bowel sounds GU: no suprapubic tenderness Extremities: trace edema to bilateral lower legs, no joint deformities Skin: no rashes on exposed skin Neurological: Weakness but otherwise nonfocal  PAST MEDICAL HISTORY:  Past Medical History:  Diagnosis Date  . Anemia    yrs ago  . Arthritis   . Back pain   . Carotid bruit    right side more so than left;but per pt not enough to clean out  . Constipation    metamucil bid  . Diverticulosis    right  . GERD (gastroesophageal reflux disease)    related to some meds;but doesn't take any medications  . History of migraine    stopped with menopause  . Hyperlipidemia    takes crestor daily  . Hypertension    takes Lisinopril daily  . Hypothyroidism    takes Synthroid daily  . Internal hemorrhoids   . Joint pain   . Joint swelling   . Macular degeneration    dry  . Nocturia   . Numbness    left foot  . Osteopenia   . Phlebitis    hx of-36yrs ago right leg;takes Plavix daily  but on hold for surgery  . PONV (postoperative nausea and vomiting)   . Urinary frequency   . Urinary urgency     SOCIAL HX:  Social History   Tobacco Use  . Smoking status: Never Smoker  . Smokeless tobacco: Never Used  Substance Use Topics  . Alcohol use: No    ALLERGIES:  Allergies  Allergen Reactions  . Aspirin Other (See Comments)    Patient collapsed, was rushed to the hospital and hospitalized for a week.  . Codeine Nausea And Vomiting  . Morphine And Related Nausea And Vomiting  . Other Nausea And Vomiting    AGENT:  anesthesia   . Scallops [Shellfish Allergy] Nausea And Vomiting    Headaches  . Sulfa Antibiotics Hives     PERTINENT MEDICATIONS:  Outpatient Encounter Medications as of 09/25/2019  Medication Sig  . acetaminophen (TYLENOL) 500 MG tablet Take 500 mg by mouth every 4 (four) hours as needed for mild pain or fever.  Javier Docker Oil 300 MG CAPS Take 300 mg by mouth daily.  Marland Kitchen lisinopril (ZESTRIL) 10 MG tablet Take 10 mg by mouth daily.  . Multiple Vitamins-Minerals (PRESERVISION AREDS 2 PO) Take 1 capsule by mouth 2 (two) times daily.  . rosuvastatin (CRESTOR) 10 MG tablet Take 10 mg by mouth at bedtime.   Marland Kitchen SYNTHROID 100 MCG tablet Take 100 mcg by mouth daily.   No facility-administered encounter medications on file as of 09/25/2019.    [name], NP

## 2019-10-25 ENCOUNTER — Emergency Department: Payer: Medicare HMO

## 2019-10-25 ENCOUNTER — Other Ambulatory Visit: Payer: Self-pay

## 2019-10-25 ENCOUNTER — Inpatient Hospital Stay: Payer: Medicare HMO

## 2019-10-25 ENCOUNTER — Inpatient Hospital Stay
Admission: EM | Admit: 2019-10-25 | Discharge: 2019-10-28 | DRG: 282 | Disposition: A | Discharge status: Skilled Nursing Facility | Payer: Medicare HMO | Attending: Internal Medicine | Admitting: Internal Medicine

## 2019-10-25 DIAGNOSIS — Z803 Family history of malignant neoplasm of breast: Secondary | ICD-10-CM

## 2019-10-25 DIAGNOSIS — Z8672 Personal history of thrombophlebitis: Secondary | ICD-10-CM

## 2019-10-25 DIAGNOSIS — E785 Hyperlipidemia, unspecified: Secondary | ICD-10-CM | POA: Diagnosis present

## 2019-10-25 DIAGNOSIS — Z8249 Family history of ischemic heart disease and other diseases of the circulatory system: Secondary | ICD-10-CM | POA: Diagnosis not present

## 2019-10-25 DIAGNOSIS — I2510 Atherosclerotic heart disease of native coronary artery without angina pectoris: Secondary | ICD-10-CM | POA: Diagnosis present

## 2019-10-25 DIAGNOSIS — M8580 Other specified disorders of bone density and structure, unspecified site: Secondary | ICD-10-CM | POA: Diagnosis present

## 2019-10-25 DIAGNOSIS — R079 Chest pain, unspecified: Secondary | ICD-10-CM

## 2019-10-25 DIAGNOSIS — I10 Essential (primary) hypertension: Secondary | ICD-10-CM | POA: Diagnosis present

## 2019-10-25 DIAGNOSIS — K219 Gastro-esophageal reflux disease without esophagitis: Secondary | ICD-10-CM | POA: Diagnosis present

## 2019-10-25 DIAGNOSIS — K5790 Diverticulosis of intestine, part unspecified, without perforation or abscess without bleeding: Secondary | ICD-10-CM | POA: Diagnosis present

## 2019-10-25 DIAGNOSIS — I214 Non-ST elevation (NSTEMI) myocardial infarction: Secondary | ICD-10-CM | POA: Diagnosis present

## 2019-10-25 DIAGNOSIS — M21619 Bunion of unspecified foot: Secondary | ICD-10-CM | POA: Diagnosis present

## 2019-10-25 DIAGNOSIS — E782 Mixed hyperlipidemia: Secondary | ICD-10-CM | POA: Diagnosis not present

## 2019-10-25 DIAGNOSIS — H3530 Unspecified macular degeneration: Secondary | ICD-10-CM | POA: Diagnosis present

## 2019-10-25 DIAGNOSIS — Z79890 Hormone replacement therapy (postmenopausal): Secondary | ICD-10-CM | POA: Diagnosis not present

## 2019-10-25 DIAGNOSIS — R739 Hyperglycemia, unspecified: Secondary | ICD-10-CM | POA: Diagnosis present

## 2019-10-25 DIAGNOSIS — I252 Old myocardial infarction: Secondary | ICD-10-CM | POA: Diagnosis not present

## 2019-10-25 DIAGNOSIS — I342 Nonrheumatic mitral (valve) stenosis: Secondary | ICD-10-CM | POA: Diagnosis not present

## 2019-10-25 DIAGNOSIS — E039 Hypothyroidism, unspecified: Secondary | ICD-10-CM | POA: Diagnosis present

## 2019-10-25 DIAGNOSIS — M7989 Other specified soft tissue disorders: Secondary | ICD-10-CM

## 2019-10-25 DIAGNOSIS — Z886 Allergy status to analgesic agent status: Secondary | ICD-10-CM | POA: Diagnosis not present

## 2019-10-25 DIAGNOSIS — Z20822 Contact with and (suspected) exposure to covid-19: Secondary | ICD-10-CM | POA: Diagnosis present

## 2019-10-25 DIAGNOSIS — R609 Edema, unspecified: Secondary | ICD-10-CM

## 2019-10-25 DIAGNOSIS — Z79899 Other long term (current) drug therapy: Secondary | ICD-10-CM

## 2019-10-25 DIAGNOSIS — Z66 Do not resuscitate: Secondary | ICD-10-CM | POA: Diagnosis present

## 2019-10-25 DIAGNOSIS — I340 Nonrheumatic mitral (valve) insufficiency: Secondary | ICD-10-CM | POA: Diagnosis not present

## 2019-10-25 LAB — CBC WITH DIFFERENTIAL/PLATELET
Abs Immature Granulocytes: 0.11 10*3/uL — ABNORMAL HIGH (ref 0.00–0.07)
Basophils Absolute: 0 10*3/uL (ref 0.0–0.1)
Basophils Relative: 0 %
Eosinophils Absolute: 0 10*3/uL (ref 0.0–0.5)
Eosinophils Relative: 0 %
HCT: 37.6 % (ref 36.0–46.0)
Hemoglobin: 12.4 g/dL (ref 12.0–15.0)
Immature Granulocytes: 1 %
Lymphocytes Relative: 11 %
Lymphs Abs: 1.1 10*3/uL (ref 0.7–4.0)
MCH: 30.3 pg (ref 26.0–34.0)
MCHC: 33 g/dL (ref 30.0–36.0)
MCV: 91.9 fL (ref 80.0–100.0)
Monocytes Absolute: 0.1 10*3/uL (ref 0.1–1.0)
Monocytes Relative: 1 %
Neutro Abs: 8.1 10*3/uL — ABNORMAL HIGH (ref 1.7–7.7)
Neutrophils Relative %: 87 %
Platelets: 237 10*3/uL (ref 150–400)
RBC: 4.09 MIL/uL (ref 3.87–5.11)
RDW: 13.2 % (ref 11.5–15.5)
WBC: 9.4 10*3/uL (ref 4.0–10.5)
nRBC: 0 % (ref 0.0–0.2)

## 2019-10-25 LAB — COMPREHENSIVE METABOLIC PANEL
ALT: 16 U/L (ref 0–44)
AST: 27 U/L (ref 15–41)
Albumin: 4.1 g/dL (ref 3.5–5.0)
Alkaline Phosphatase: 60 U/L (ref 38–126)
Anion gap: 11 (ref 5–15)
BUN: 27 mg/dL — ABNORMAL HIGH (ref 8–23)
CO2: 24 mmol/L (ref 22–32)
Calcium: 9.3 mg/dL (ref 8.9–10.3)
Chloride: 103 mmol/L (ref 98–111)
Creatinine, Ser: 0.85 mg/dL (ref 0.44–1.00)
GFR calc Af Amer: >60 mL/min (ref >60)
GFR calc non Af Amer: >60 mL/min (ref >60)
Glucose, Bld: 217 mg/dL — ABNORMAL HIGH (ref 70–99)
Potassium: 4.2 mmol/L (ref 3.5–5.1)
Sodium: 138 mmol/L (ref 135–145)
Total Bilirubin: 0.8 mg/dL (ref 0.3–1.2)
Total Protein: 7 g/dL (ref 6.5–8.1)

## 2019-10-25 LAB — PROTIME-INR
INR: 1 (ref 0.8–1.2)
Prothrombin Time: 12.3 seconds (ref 11.4–15.2)

## 2019-10-25 LAB — LIPASE, BLOOD: Lipase: 54 U/L — ABNORMAL HIGH (ref 11–51)

## 2019-10-25 LAB — TROPONIN I (HIGH SENSITIVITY)
Troponin I (High Sensitivity): 25 ng/L — ABNORMAL HIGH (ref <18)
Troponin I (High Sensitivity): 416 ng/L (ref <18)
Troponin I (High Sensitivity): 5580 ng/L (ref <18)
Troponin I (High Sensitivity): 5859 ng/L (ref <18)

## 2019-10-25 LAB — MAGNESIUM: Magnesium: 1.9 mg/dL (ref 1.7–2.4)

## 2019-10-25 LAB — APTT: aPTT: 28 seconds (ref 24–36)

## 2019-10-25 LAB — SARS CORONAVIRUS 2 BY RT PCR (HOSPITAL ORDER, PERFORMED IN ~~LOC~~ HOSPITAL LAB): SARS Coronavirus 2: NEGATIVE

## 2019-10-25 LAB — FIBRIN DERIVATIVES D-DIMER (ARMC ONLY): Fibrin derivatives D-dimer (ARMC): 2240.71 ng/mL (FEU) — ABNORMAL HIGH (ref 0.00–499.00)

## 2019-10-25 LAB — PHOSPHORUS: Phosphorus: 2.3 mg/dL — ABNORMAL LOW (ref 2.5–4.6)

## 2019-10-25 IMAGING — CR DG CHEST 2V
2 series · 2 of 2 positions shown · non-contrast
Comparison: Chest radiograph [DATE].

CLINICAL DATA: Patient with chest pain.

EXAM:
CHEST - 2 VIEW

[chest pa]
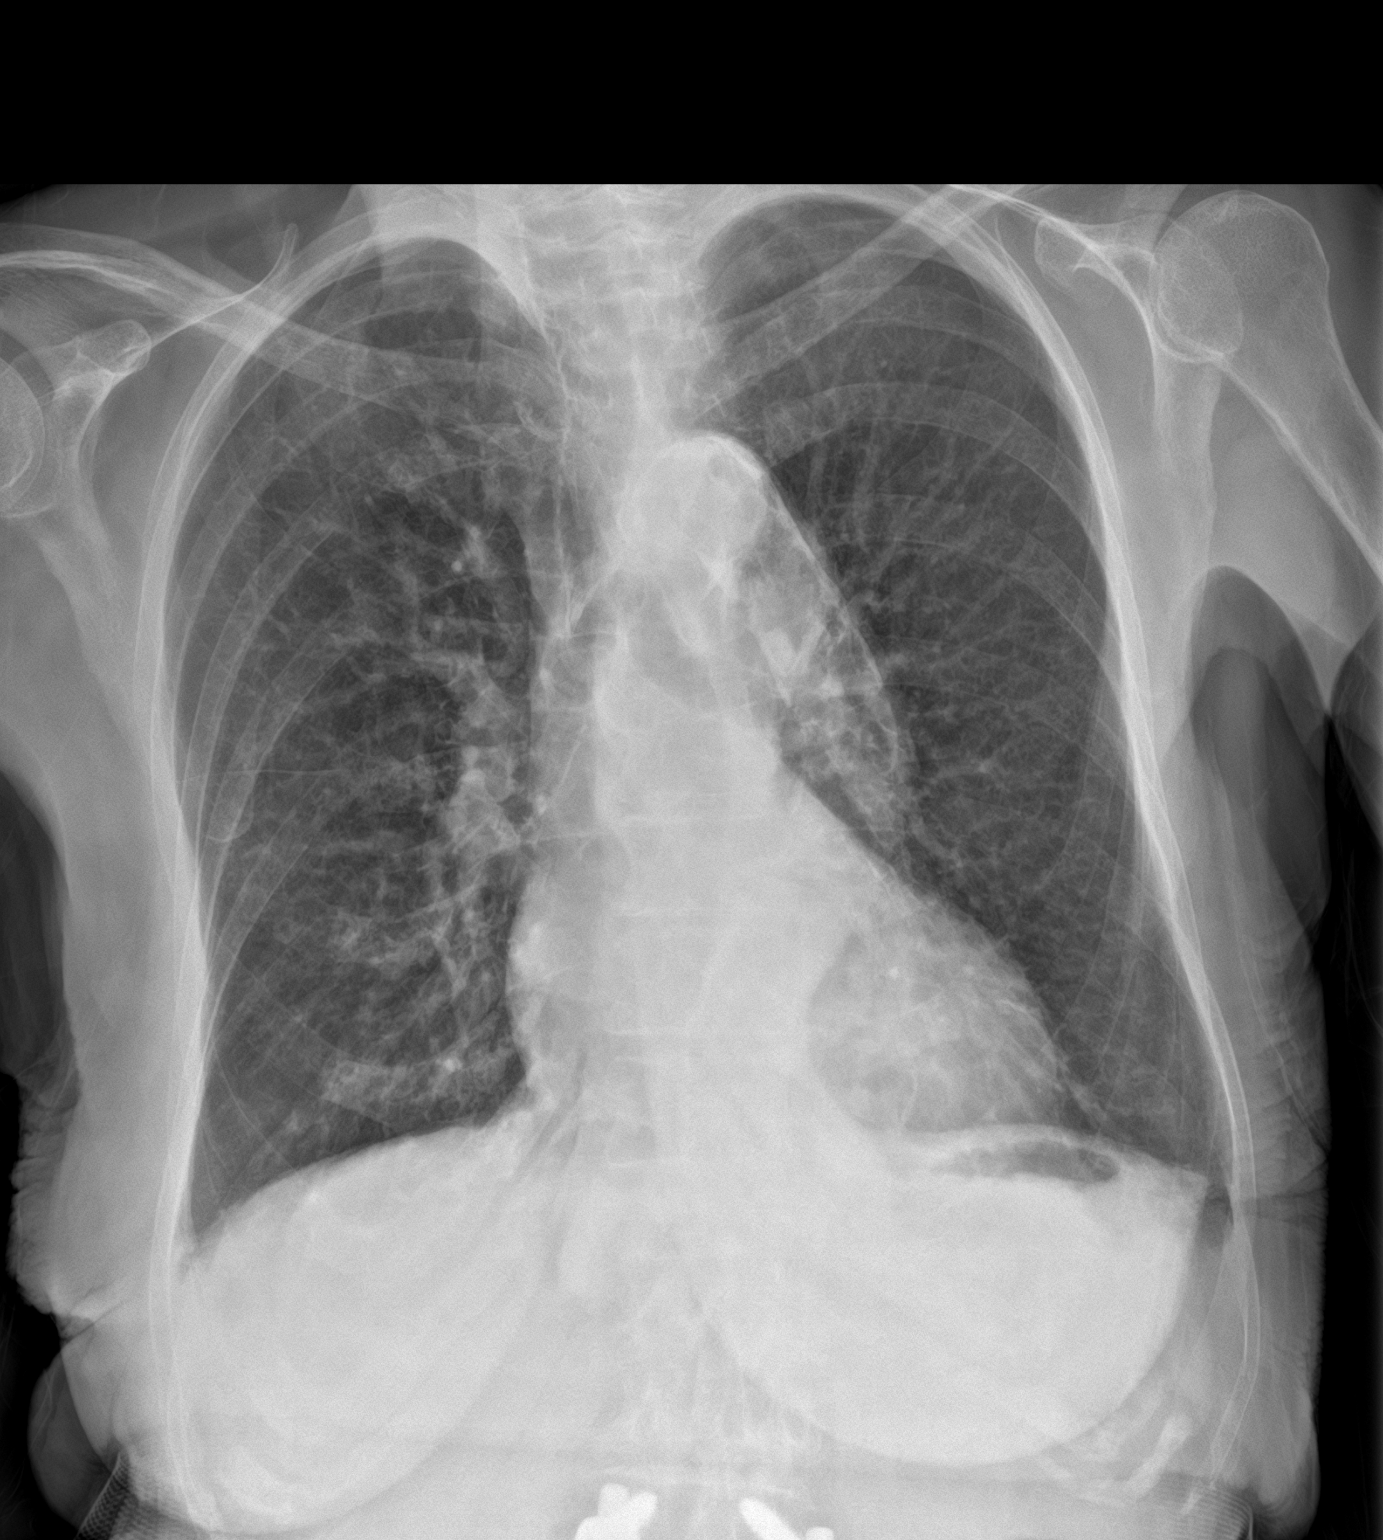

[chest lat]
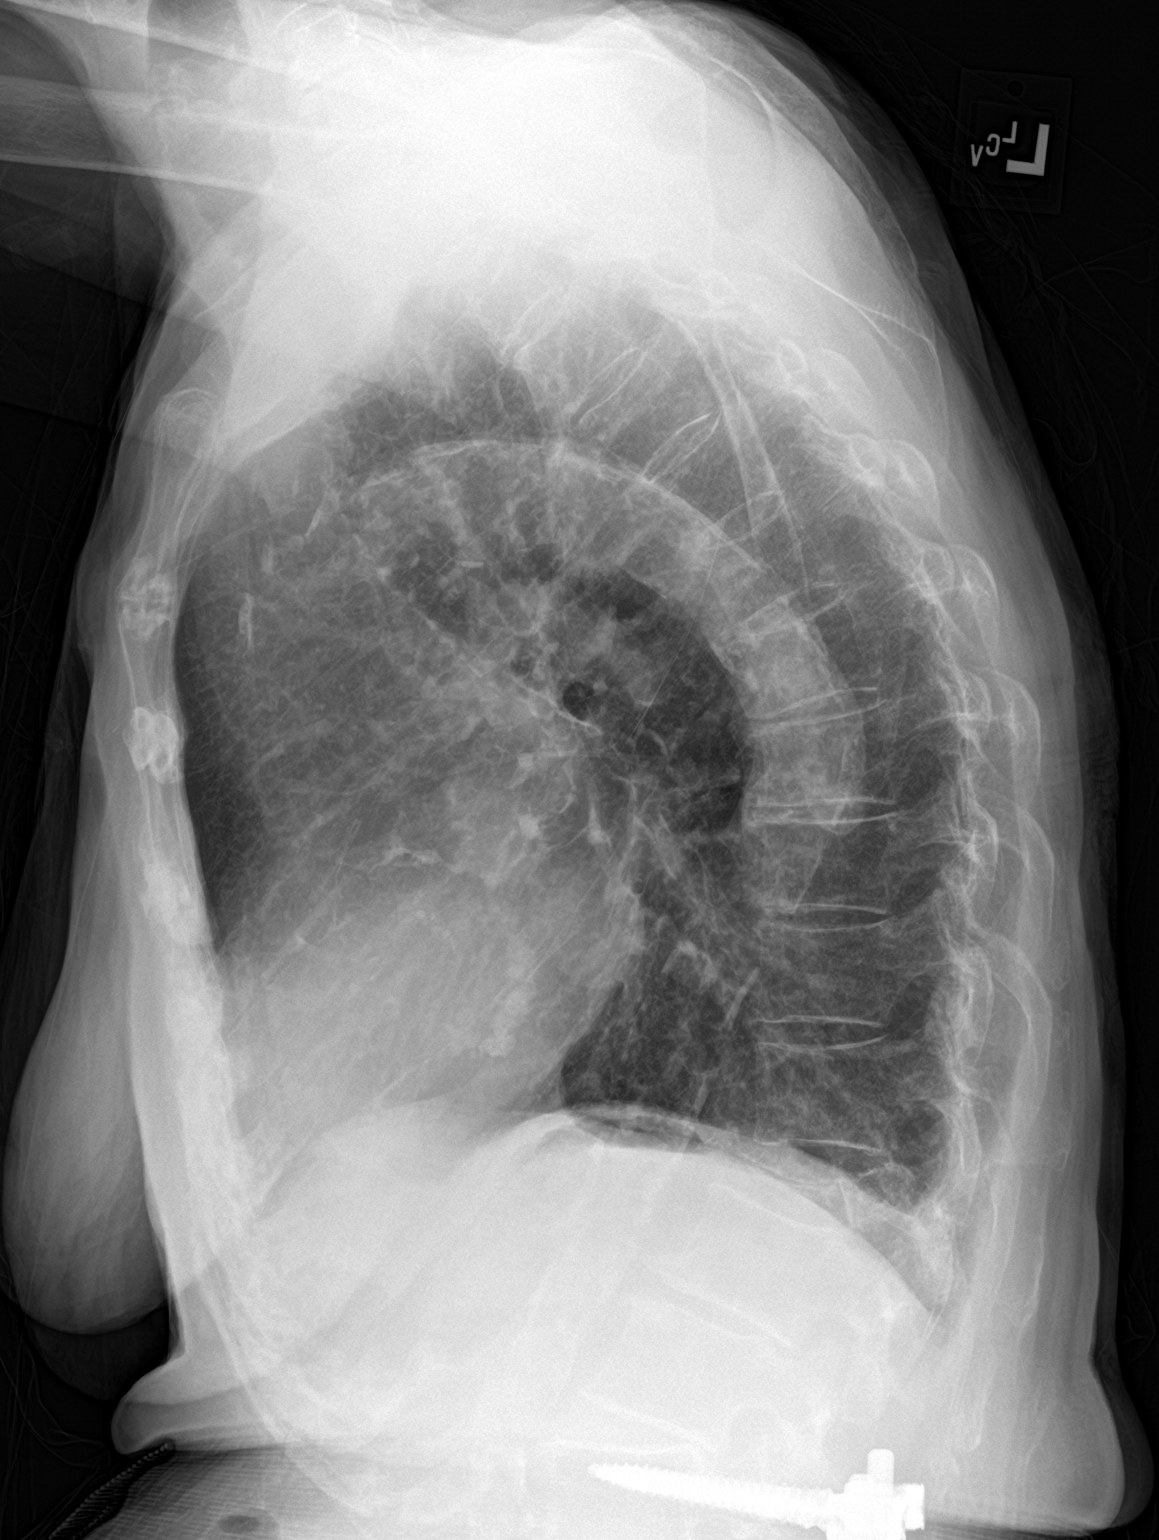

[2 of 2 positions shown; findings below may reference images not displayed]

FINDINGS: Normal cardiac and mediastinal contours. Tortuosity of the thoracic
aorta. No large area pulmonary consolidation. No pleural effusion or
pneumothorax. Emphysematous changes. Redemonstrated T12 compression
deformity.
IMPRESSION: No acute cardiopulmonary process.

## 2019-10-25 IMAGING — US US EXTREM LOW VENOUS
1 series · 14 of 24 positions shown · non-contrast
Comparison: [DATE] bilateral lower extremity duplex.

CLINICAL DATA: [AGE] with elevated D-dimer and leg swelling.

EXAM:
BILATERAL LOWER EXTREMITY VENOUS DOPPLER ULTRASOUND
TECHNIQUE: Gray-scale sonography with compression, as well as color and duplex
ultrasound, were performed to evaluate the deep venous system(s)
from the level of the common femoral vein through the popliteal and
proximal calf veins.

[Series 1: us venous img lower bilat (dvt) · portal-venous · 14 of 109 slices shown]
[im 1/109]
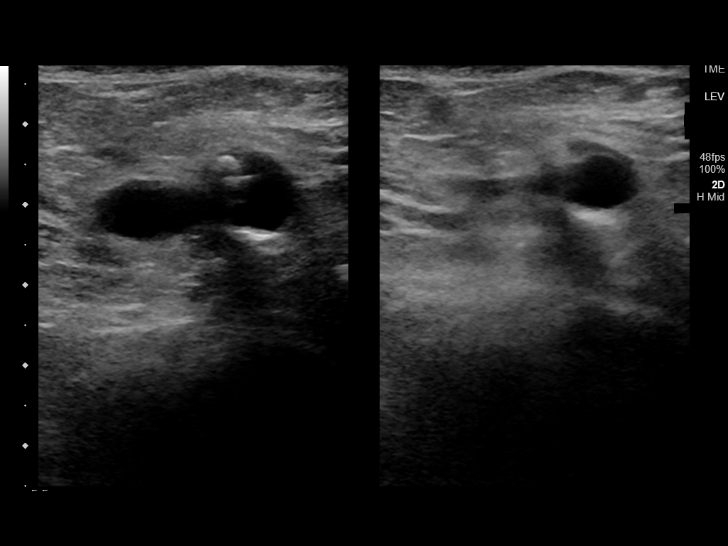
[im 10/109]
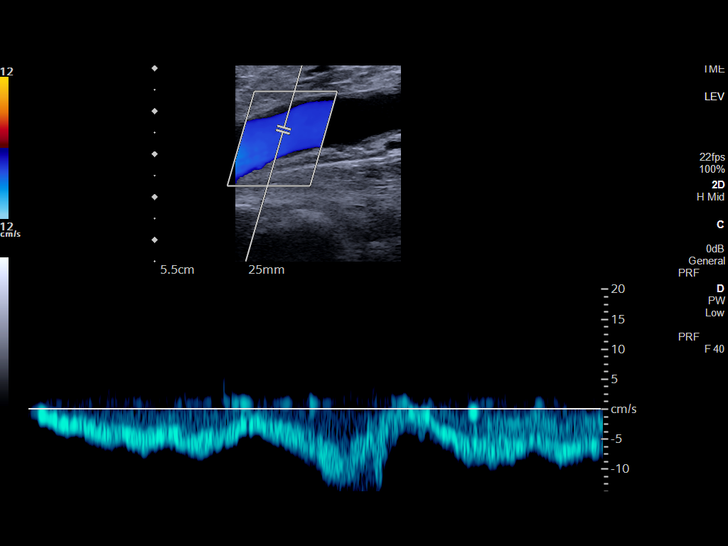
[im 19/109]
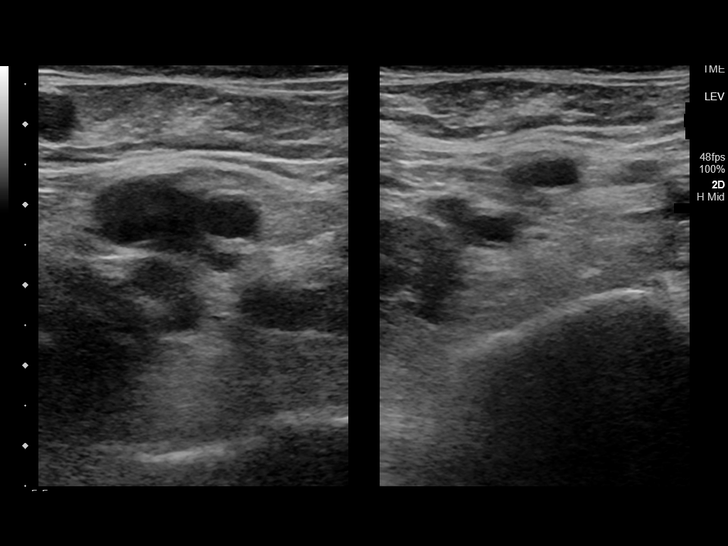
[im 29/109]
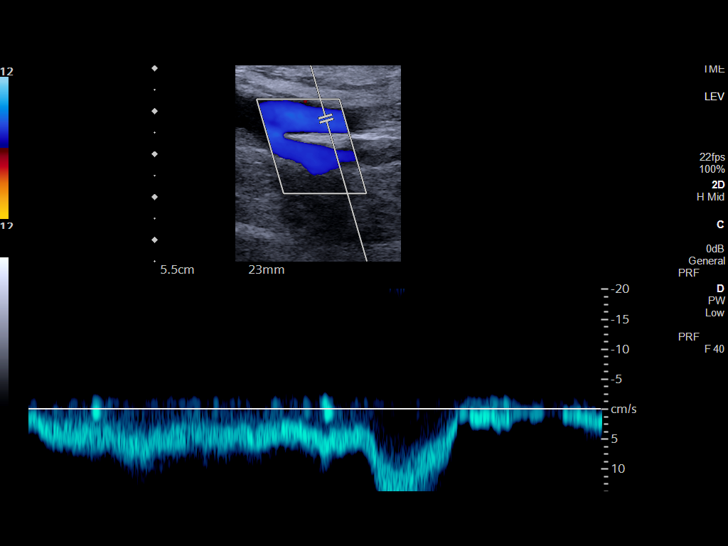
[im 33/109]
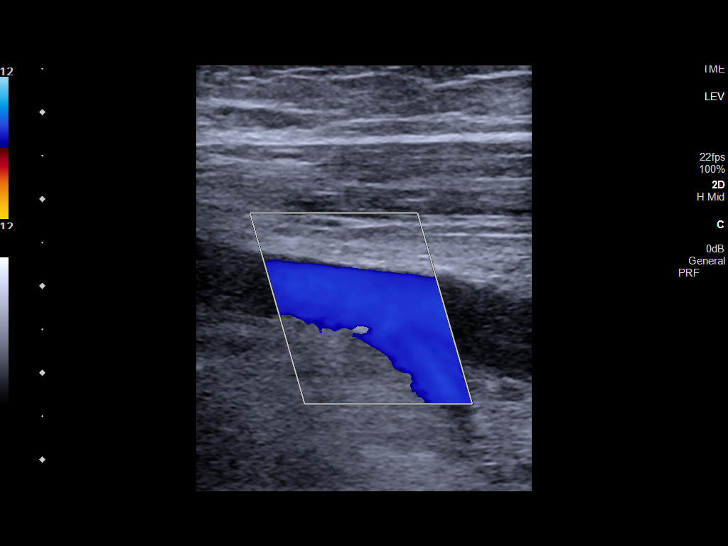
[im 43/109]
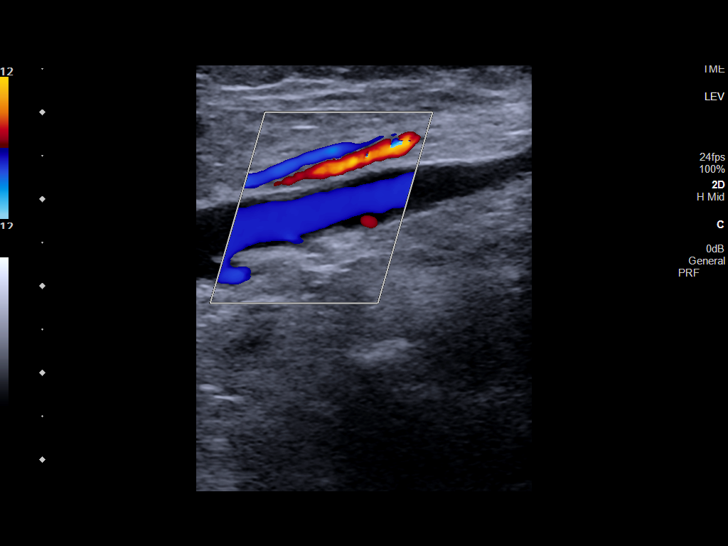
[im 52/109]
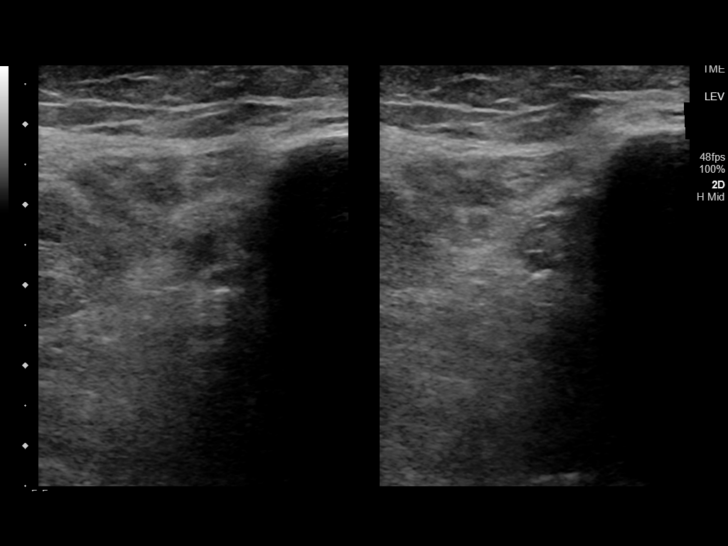
[im 57/109]
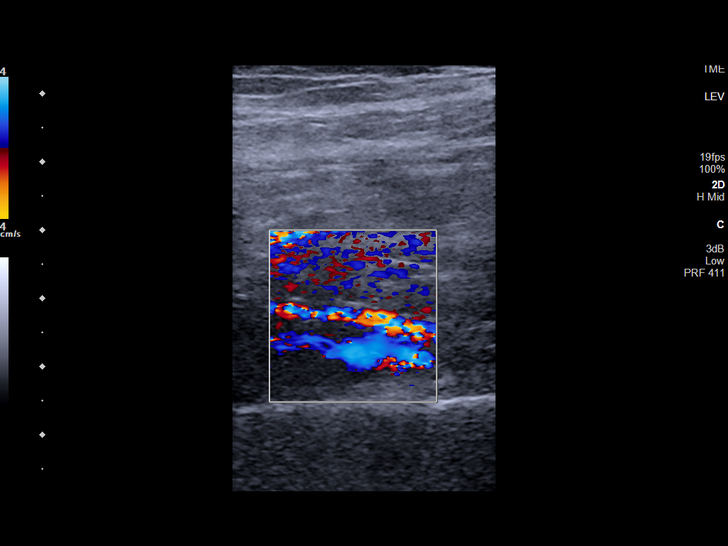
[im 66/109]
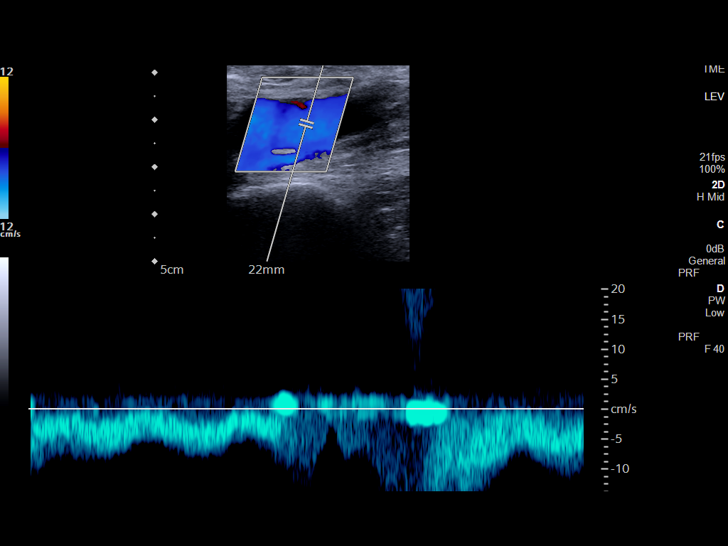
[im 76/109]
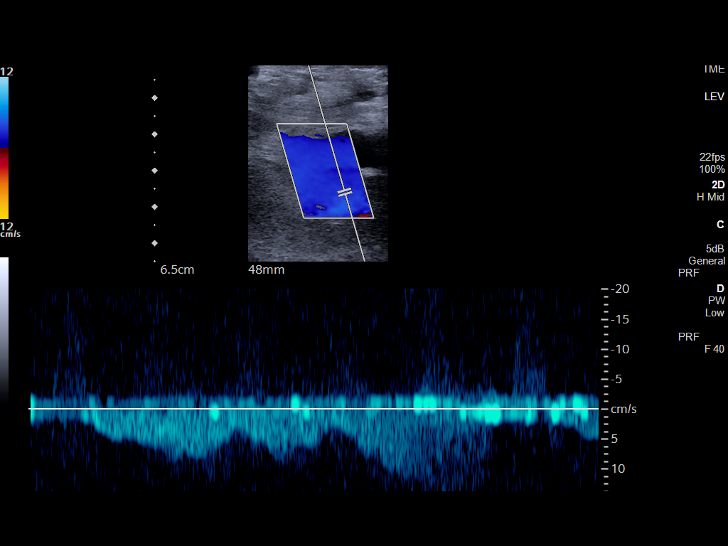
[im 85/109]
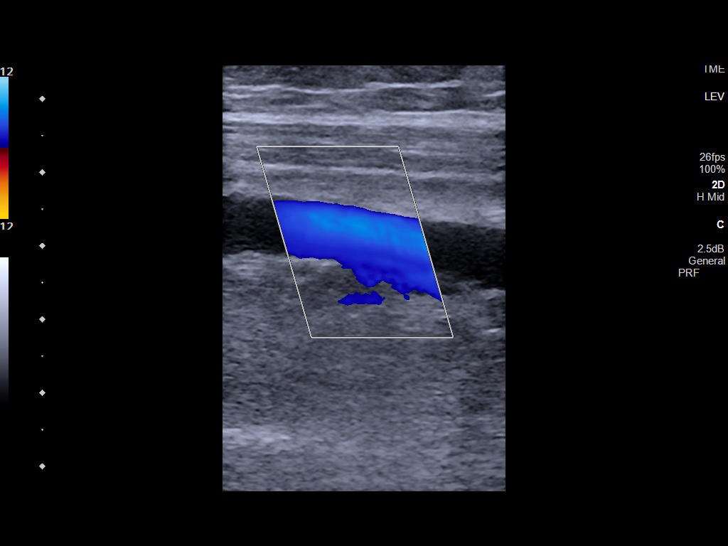
[im 90/109]
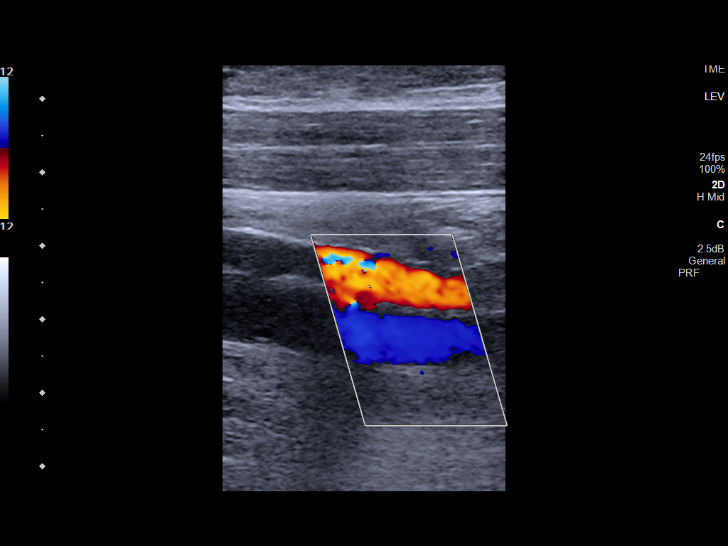
[im 99/109]
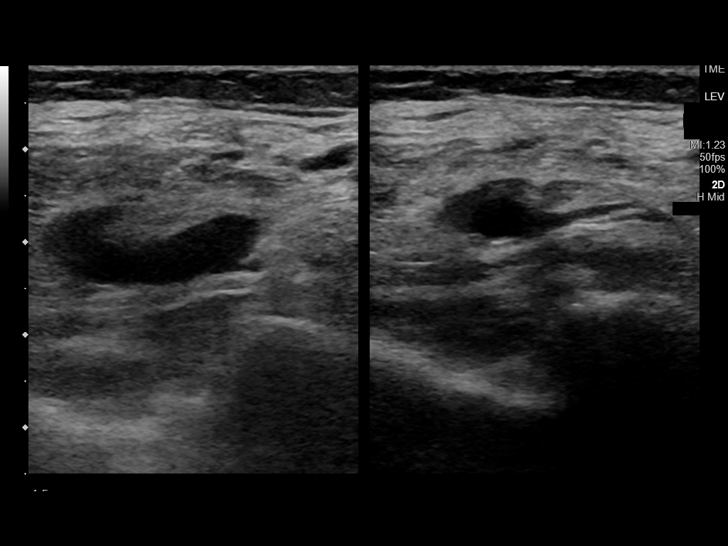
[im 109/109]
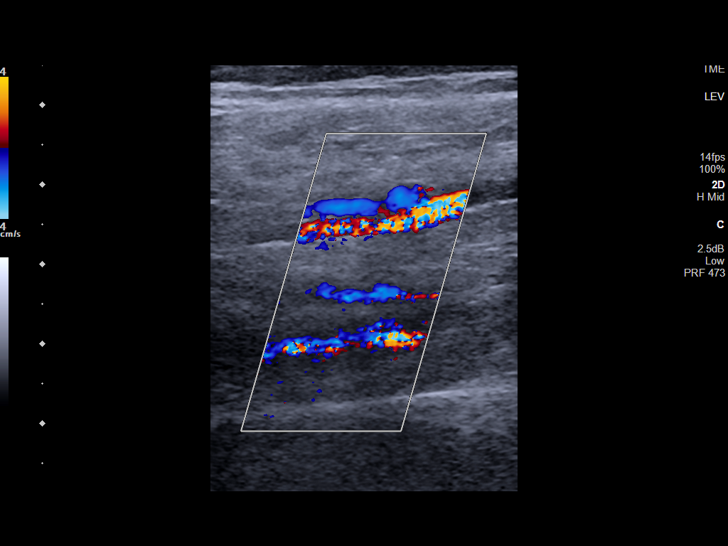

[14 of 24 positions shown; findings below may reference images not displayed]

FINDINGS: VENOUS

Normal compressibility of the common femoral, superficial femoral,
and popliteal veins, as well as the visualized calf veins.
Visualized portions of profunda femoral vein and great saphenous
vein unremarkable. No filling defects to suggest DVT on grayscale or
color Doppler imaging. Doppler waveforms show normal direction of
venous flow, normal respiratory plasticity and response to
augmentation.

OTHER

None.

Limitations: none
IMPRESSION: No evidence of bilateral lower extremity DVT.

## 2019-10-25 IMAGING — CT CT ANGIO CHEST
2 of 6 series · 18 of 46 positions shown · IV contrast (omnipaque)
Comparison: [DATE]

CLINICAL DATA: Chest pain, PE suspected

EXAM:
CT ANGIOGRAPHY CHEST WITH CONTRAST
TECHNIQUE: Multidetector CT imaging of the chest was performed using the
standard protocol during bolus administration of intravenous
contrast. Multiplanar CT image reconstructions and MIPs were
obtained to evaluate the vascular anatomy.
CONTRAST:  60mL OMNIPAQUE IOHEXOL 350 MG/ML SOLN

[Series 5: thins · axial · 0.61mm/px · z∈[-278,-34]mm · 15 of 267 slices shown]
[im 12/267  lung]
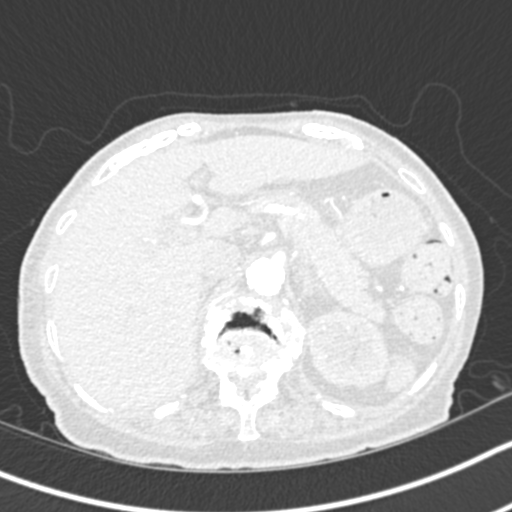
[im 35/267  soft-tissue]
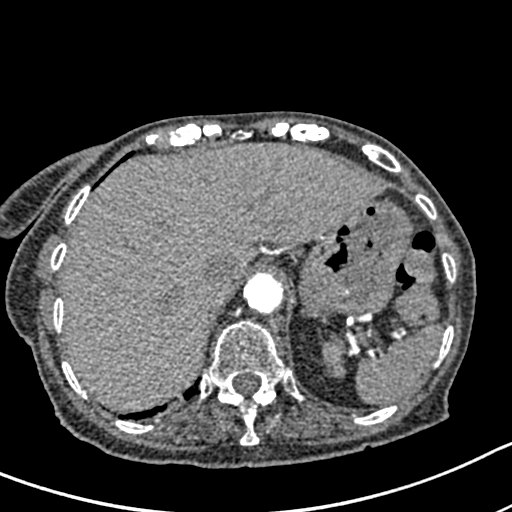
[im 47/267  lung]
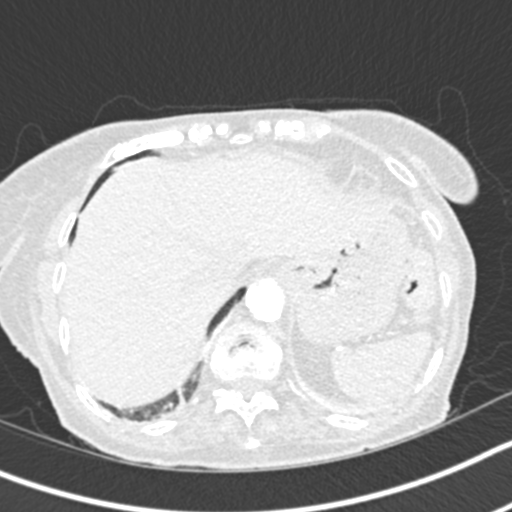
[im 70/267  soft-tissue]
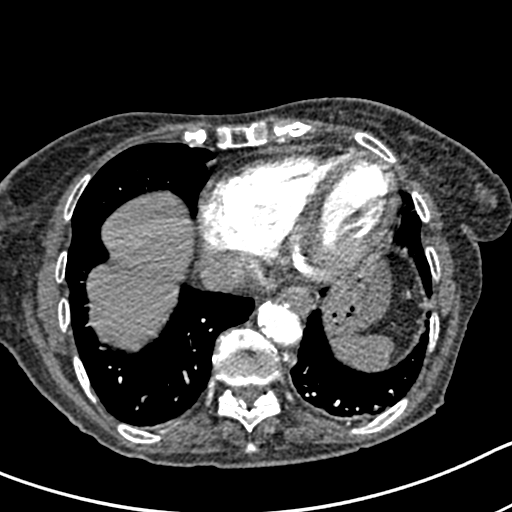
[im 81/267  lung]
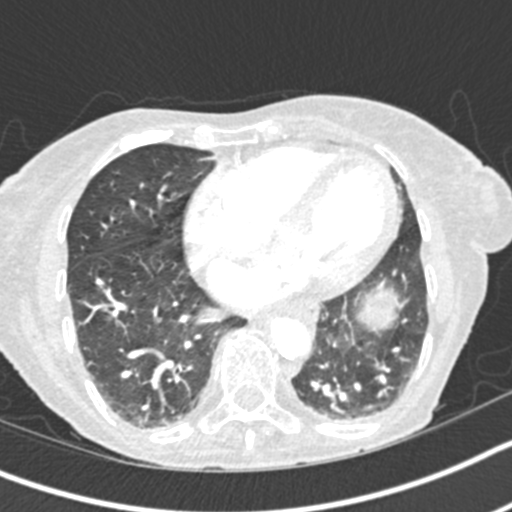
[im 105/267  soft-tissue]
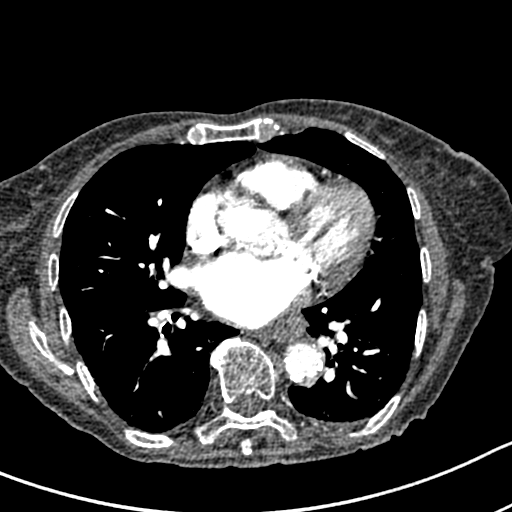
[im 116/267  lung]
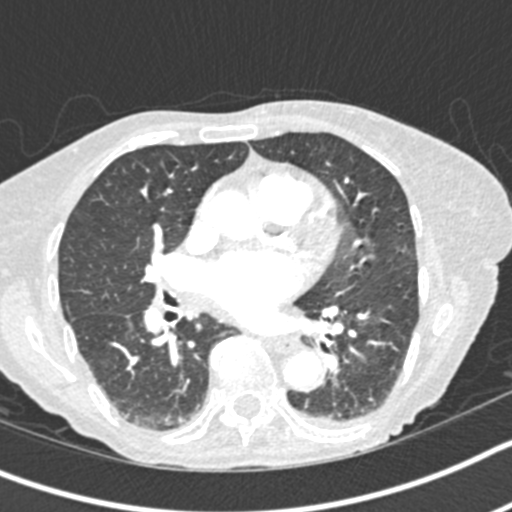
[im 139/267  soft-tissue]
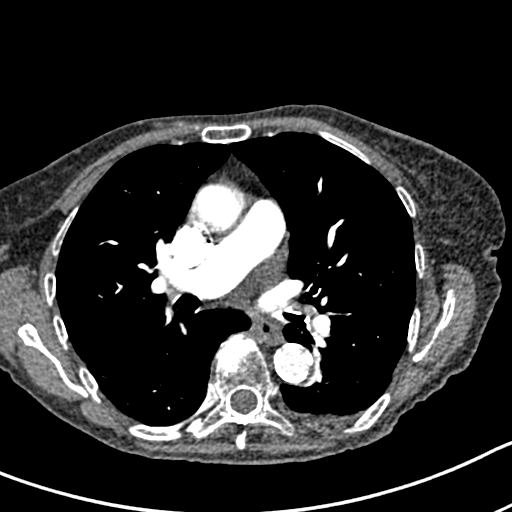
[im 151/267  lung]
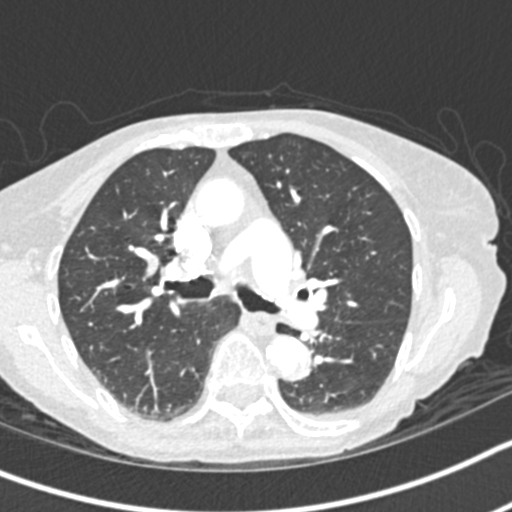
[im 162/267  soft-tissue]
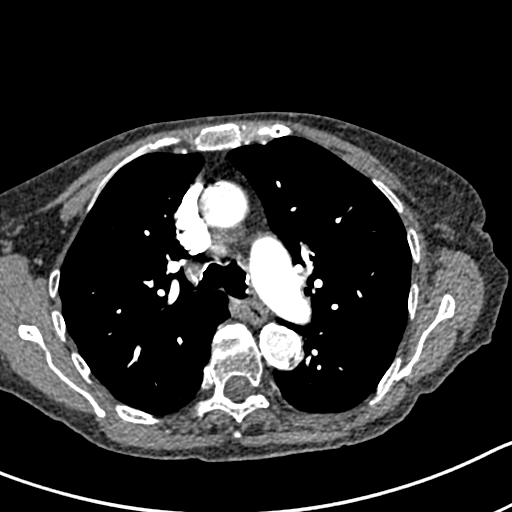
[im 186/267  lung]
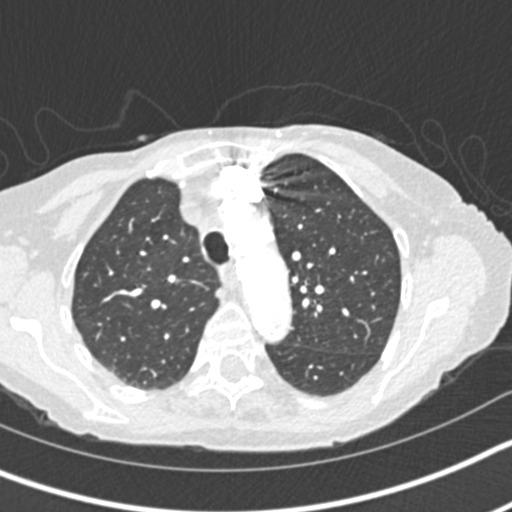
[im 197/267  soft-tissue]
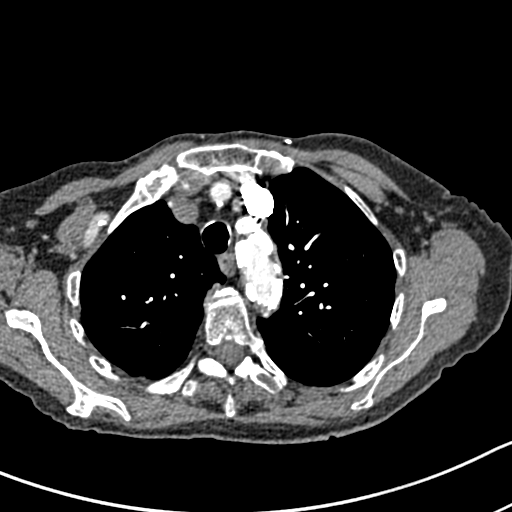
[im 220/267  lung]
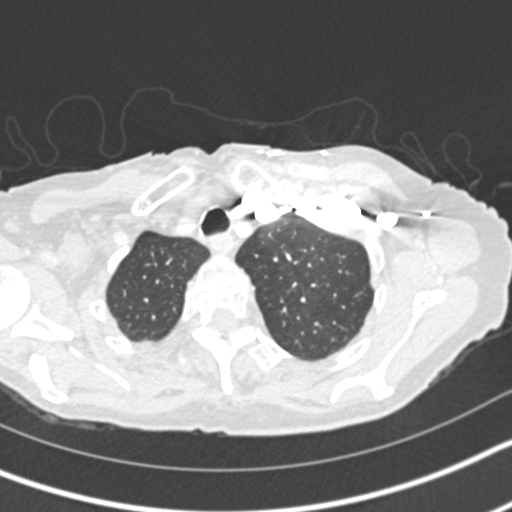
[im 232/267  soft-tissue]
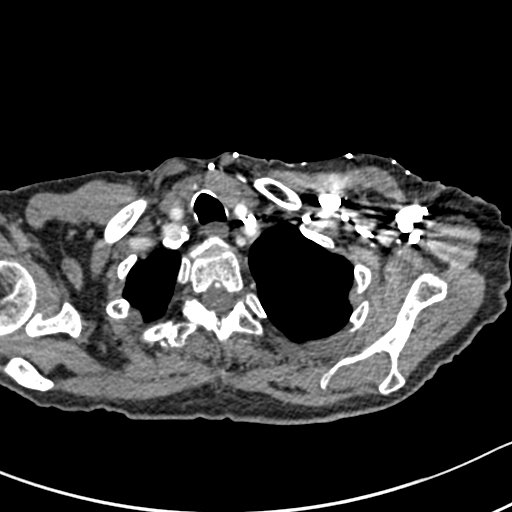
[im 255/267  lung]
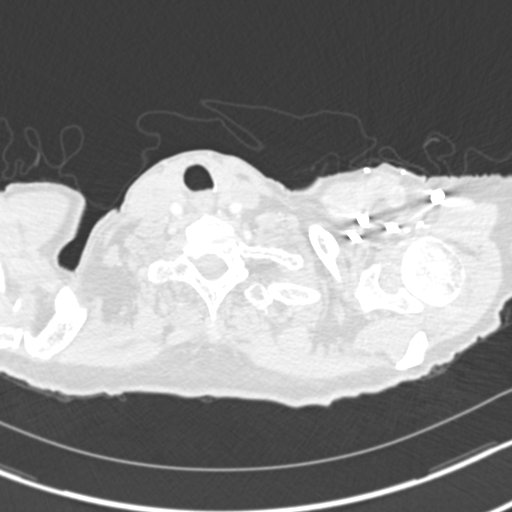

[Series 7: coronal mpr · coronal · 0.52mm/px · 3 of 80 slices shown]
[im 20/80  soft-tissue]
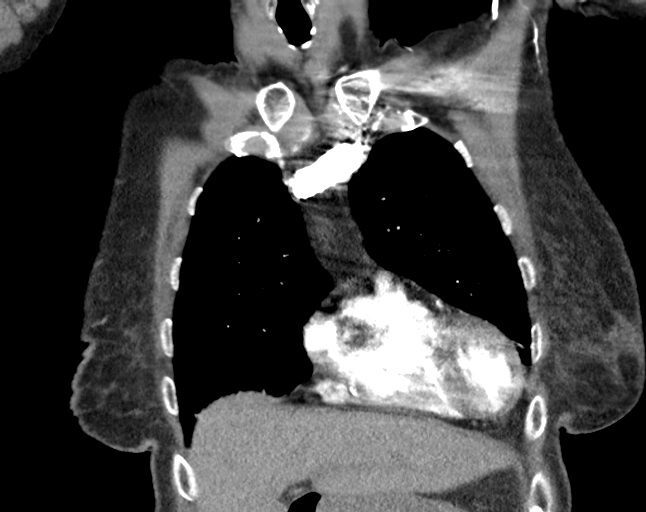
[im 40/80  soft-tissue]
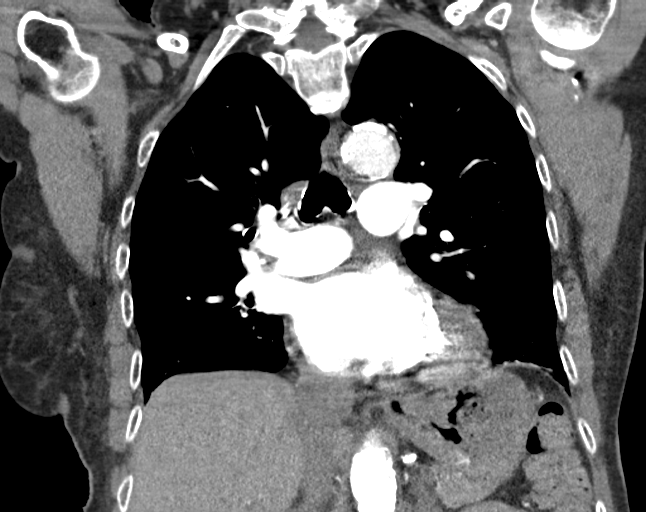
[im 60/80  soft-tissue]
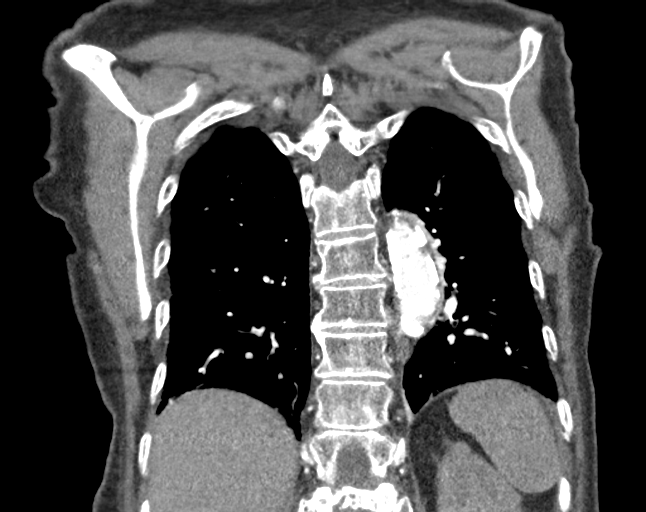

[18 of 46 positions shown; findings below may reference images not displayed]

FINDINGS: Cardiovascular: Satisfactory opacification of the pulmonary arteries
to the segmental level. No evidence of pulmonary embolism. Normal
heart size. Extensive 3 vessel coronary artery calcifications and/or
stents. No pericardial effusion. Severe mixed aortic
atherosclerosis.

Mediastinum/Nodes: No enlarged mediastinal, hilar, or axillary lymph
nodes. Thyroid gland, trachea, and esophagus demonstrate no
significant findings.

Lungs/Pleura: Lungs are clear. No pleural effusion or pneumothorax.

Upper Abdomen: No acute abnormality.

Musculoskeletal: No chest wall abnormality. There is a new, although
age indeterminate high-grade wedge deformity of the L1 vertebral
body, partially imaged.

Review of the MIP images confirms the above findings.
IMPRESSION: 1. Negative examination for pulmonary embolism.
2. Coronary artery disease. Aortic Atherosclerosis ([ZE]-[ZE]).
3. New, although age indeterminate high-grade wedge deformity of the
L1 vertebral body, partially imaged. Correlate for acute pain and
point tenderness.

## 2019-10-25 MED ORDER — ACETAMINOPHEN 325 MG PO TABS
650.0000 mg | ORAL_TABLET | Freq: Once | ORAL | Status: AC
  Administered 2019-10-25: 650 mg via ORAL
  Filled 2019-10-25: qty 2

## 2019-10-25 MED ORDER — LISINOPRIL 10 MG PO TABS: 10.0000 mg | ORAL_TABLET | Freq: Every day | ORAL | Status: DC

## 2019-10-25 MED ORDER — PANTOPRAZOLE SODIUM 40 MG PO TBEC
40.0000 mg | DELAYED_RELEASE_TABLET | Freq: Every day | ORAL | Status: DC
  Administered 2019-10-26 – 2019-10-28 (×3): 40 mg via ORAL
  Filled 2019-10-25 (×3): qty 1

## 2019-10-25 MED ORDER — LEVOTHYROXINE SODIUM 100 MCG PO TABS
100.0000 ug | ORAL_TABLET | Freq: Every day | ORAL | Status: DC
  Administered 2019-10-26 – 2019-10-28 (×3): 100 ug via ORAL
  Filled 2019-10-25 (×3): qty 1

## 2019-10-25 MED ORDER — IOHEXOL 350 MG/ML SOLN
60.0000 mL | Freq: Once | INTRAVENOUS | Status: AC | PRN
  Administered 2019-10-25: 60 mL via INTRAVENOUS

## 2019-10-25 MED ORDER — METOPROLOL TARTRATE 25 MG PO TABS: 12.5000 mg | ORAL_TABLET | Freq: Two times a day (BID) | ORAL | Status: DC

## 2019-10-25 MED ORDER — ONDANSETRON HCL 4 MG/2ML IJ SOLN: 4.0000 mg | Freq: Four times a day (QID) | INTRAMUSCULAR | Status: DC | PRN

## 2019-10-25 MED ORDER — AMOXICILLIN-POT CLAVULANATE 875-125 MG PO TABS
1.0000 | ORAL_TABLET | Freq: Two times a day (BID) | ORAL | Status: DC
  Administered 2019-10-26 – 2019-10-28 (×6): 1 via ORAL
  Filled 2019-10-25 (×6): qty 1

## 2019-10-25 MED ORDER — ROSUVASTATIN CALCIUM 10 MG PO TABS: 40.0000 mg | ORAL_TABLET | Freq: Every day | ORAL | Status: DC

## 2019-10-25 MED ORDER — ALUM & MAG HYDROXIDE-SIMETH 200-200-20 MG/5ML PO SUSP
30.0000 mL | Freq: Once | ORAL | Status: DC
  Filled 2019-10-25: qty 30

## 2019-10-25 MED ORDER — ACETAMINOPHEN 325 MG PO TABS
650.0000 mg | ORAL_TABLET | ORAL | Status: DC | PRN
  Administered 2019-10-26 (×2): 650 mg via ORAL
  Filled 2019-10-25 (×2): qty 2

## 2019-10-25 MED ORDER — METOPROLOL TARTRATE 25 MG PO TABS
25.0000 mg | ORAL_TABLET | Freq: Two times a day (BID) | ORAL | Status: DC
  Administered 2019-10-26 – 2019-10-28 (×6): 25 mg via ORAL
  Filled 2019-10-25 (×6): qty 1

## 2019-10-25 MED ORDER — HEPARIN (PORCINE) 25000 UT/250ML-% IV SOLN
650.0000 [IU]/h | INTRAVENOUS | Status: DC
  Administered 2019-10-25 – 2019-10-26 (×2): 650 [IU]/h via INTRAVENOUS
  Filled 2019-10-25 (×2): qty 250

## 2019-10-25 MED ORDER — HEPARIN BOLUS VIA INFUSION
3300.0000 [IU] | Freq: Once | INTRAVENOUS | Status: AC
  Administered 2019-10-25: 3300 [IU] via INTRAVENOUS
  Filled 2019-10-25: qty 3300

## 2019-10-25 MED ORDER — FAMOTIDINE 20 MG PO TABS
20.0000 mg | ORAL_TABLET | Freq: Once | ORAL | Status: AC
  Administered 2019-10-26: 20 mg via ORAL
  Filled 2019-10-25: qty 1

## 2019-10-25 MED ORDER — FAMOTIDINE 20 MG PO TABS: 20.0000 mg | ORAL_TABLET | Freq: Every day | ORAL | Status: DC

## 2019-10-25 MED ORDER — DULOXETINE HCL 30 MG PO CPEP
30.0000 mg | ORAL_CAPSULE | Freq: Every day | ORAL | Status: DC
  Administered 2019-10-26 – 2019-10-28 (×3): 30 mg via ORAL
  Filled 2019-10-25 (×3): qty 1

## 2019-10-25 NOTE — Consult Note (Addendum)
Cardiology Consultation:   Patient ID: Faithann Natal MRN: [id number]; DOB: 06/23/1927  Admit date: 10/25/2019 Date of Consult: 10/25/2019  Primary Care Provider: Derinda Late, MD [facility] Cardiologist: No primary care provider on file. new- Dr. Darlis Loan [facility] Electrophysiologist:  None    Patient Profile:   Casha Estupinan is a 86 y.o. female with a hx of mild carotid artery disease who is being seen today for the evaluation of chest discomfort at the request of Dr. Alfredia Ferguson.  History of Present Illness:   Ms. Woodford has no significant cardiac history.  She developed severe left sided and substernal chest pain this afternoon around lunchtime.  It was a different kind of discomfort.  It resolved after nitroglycerin.  She was also started on IV heparin.  She is intolerant to aspirin so this was not given.  Currently, she feels much better.  She is not having pain.    Troponin was elevated so we were consulted.  We discussed further work-up at this time.  Her daughter was on the phone for much of the conversation.  The patient's main point was she did not want resuscitation.   Past Medical History:  Diagnosis Date  . Anemia    yrs ago  . Arthritis   . Back pain   . Carotid bruit    right side more so than left;but per pt not enough to clean out  . Constipation    metamucil bid  . Diverticulosis    right  . GERD (gastroesophageal reflux disease)    related to some meds;but doesn't take any medications  . History of migraine    stopped with menopause  . Hyperlipidemia    takes crestor daily  . Hypertension    takes Lisinopril daily  . Hypothyroidism    takes Synthroid daily  . Internal hemorrhoids   . Joint pain   . Joint swelling   . Macular degeneration    dry  . Nocturia   . Numbness    left foot  . Osteopenia   . Phlebitis    hx of-38yrs ago right leg;takes Plavix daily but on hold for surgery  . PONV (postoperative nausea and  vomiting)   . Urinary frequency   . Urinary urgency     Past Surgical History:  Procedure Laterality Date  . BREAST BIOPSY Right 12/1997   neg  . BREAST CYST ASPIRATION Right 06/21/1954   rt fna- milk duct-neg  . COLONOSCOPY    . DILATION AND CURETTAGE OF UTERUS    . OOPHORECTOMY Bilateral   . removal of breast cyst  1998  . removal of neck cyst  1996  . removal of tubes and ovaries         Inpatient Medications: Scheduled Meds:  Continuous Infusions: . heparin 650 Units/hr (10/25/19 2158)   PRN Meds:   Allergies:    Allergies  Allergen Reactions  . Aspirin Other (See Comments)    Patient collapsed, was rushed to the hospital and hospitalized for a week.  . Other Nausea And Vomiting and Other (See Comments)    AGENT:  anesthesia  . Scallops [Shellfish Allergy] Nausea And Vomiting    Headaches  . Sulfa Antibiotics Hives  . Docosahexaenoic Acid (Dha) Other (See Comments)    GI upset.  . Codeine Nausea And Vomiting  . Morphine And Related Nausea And Vomiting    Social History:   Social History   Socioeconomic History  . Marital status: Married  Spouse name: Not on file  . Number of children: Not on file  . Years of education: Not on file  . Highest education level: Not on file  Occupational History  . Not on file  Tobacco Use  . Smoking status: Never Smoker  . Smokeless tobacco: Never Used  Substance and Sexual Activity  . Alcohol use: No  . Drug use: No  . Sexual activity: Never  Other Topics Concern  . Not on file  Social History Narrative  . Not on file   Social Determinants of Health   Financial Resource Strain:   . Difficulty of Paying Living Expenses:   Food Insecurity:   . Worried About Charity fundraiser in the Last Year:   . Arboriculturist in the Last Year:   Transportation Needs:   . Film/video editor (Medical):   Marland Kitchen Lack of Transportation (Non-Medical):   Physical Activity:   . Days of Exercise per Week:   . Minutes of  Exercise per Session:   Stress:   . Feeling of Stress :   Social Connections:   . Frequency of Communication with Friends and Family:   . Frequency of Social Gatherings with Friends and Family:   . Attends Religious Services:   . Active Member of Clubs or Organizations:   . Attends Archivist Meetings:   Marland Kitchen Marital Status:   Intimate Partner Violence:   . Fear of Current or Ex-Partner:   . Emotionally Abused:   Marland Kitchen Physically Abused:   . Sexually Abused:     Family History:    Family History  Problem Relation Age of Onset  . Heart failure Mother   . Heart failure Father   . Cancer Sister   . Breast cancer Sister 48  . Cancer Brother   . Breast cancer Cousin        3 pat cousins     ROS:  Please see the history of present illness.  Chest discomfort as noted above, occasional left ankle swelling All other ROS reviewed and negative.     Physical Exam/Data:   Vitals:   10/25/19 1830 10/25/19 1900 10/25/19 2000 10/25/19 2100  BP: 125/67 119/73  121/72  Pulse: 87 88 85 82  Resp: 18 16 17 20   Temp:      TempSrc:      SpO2: 96% 96% 96% 99%  Weight:      Height:        Intake/Output Summary (Last 24 hours) at 10/25/2019 2158 Last data filed at 10/25/2019 2158 Gross per 24 hour  Intake 56.12 ml  Output --  Net 56.12 ml   Last 3 Weights 10/25/2019 12/27/2018 10/22/2018  Weight (lbs) 121 lb 132 lb 145 lb  Weight (kg) 54.885 kg 59.875 kg 65.772 kg     Body mass index is 21.43 kg/m.  General: Frail, in no acute distress HEENT: normal Lymph: no adenopathy Neck: no JVD Endocrine:  No thryomegaly Vascular: 3+ dorsalis pedis pulses bilaterally Cardiac:  normal S1, S2; RRR; no murmur Lungs:  clear to auscultation bilaterally, no wheezing, rhonchi or rales  Abd: soft, nontender, no hepatomegaly  Ext: no edema Musculoskeletal:  No deformities, BUE and BLE strength normal and equal Skin: warm and dry  Neuro:  CNs 2-12 intact, no focal abnormalities noted Psych:   Normal affect   EKG:  The EKG was personally reviewed and demonstrates: Normal sinus rhythm with anterolateral ST depression Telemetry:  Telemetry was personally reviewed and  demonstrates: Normal sinus rhythm  Relevant CV Studies: Echo from April 2018 showed vigorous LV function, EF 65 to 70%  Laboratory Data:  High Sensitivity Troponin:   Recent Labs  Lab 10/25/19 1416 10/25/19 1626  TROPONINIHS 25* 416*     Chemistry Recent Labs  Lab 10/25/19 1416  NA 138  K 4.2  CL 103  CO2 24  GLUCOSE 217*  BUN 27*  CREATININE 0.85  CALCIUM 9.3  GFRNONAA >60  GFRAA >60  ANIONGAP 11    Recent Labs  Lab 10/25/19 1416  PROT 7.0  ALBUMIN 4.1  AST 27  ALT 16  ALKPHOS 60  BILITOT 0.8   Hematology Recent Labs  Lab 10/25/19 1416  WBC 9.4  RBC 4.09  HGB 12.4  HCT 37.6  MCV 91.9  MCH 30.3  MCHC 33.0  RDW 13.2  PLT 237   BNPNo results for input(s): BNP, PROBNP in the last 168 hours.  DDimer No results for input(s): DDIMER in the last 168 hours.   Radiology/Studies:  DG Chest 2 View  Result Date: 10/25/2019 CLINICAL DATA:  Patient with chest pain. EXAM: CHEST - 2 VIEW COMPARISON:  Chest radiograph 09/10/2016. FINDINGS: Normal cardiac and mediastinal contours. Tortuosity of the thoracic aorta. No large area pulmonary consolidation. No pleural effusion or pneumothorax. Emphysematous changes. Redemonstrated T12 compression deformity. IMPRESSION: No acute cardiopulmonary process. Electronically Signed   By: Lovey Newcomer M.D.   On: 10/25/2019 15:18   CT Angio Chest PE W and/or Wo Contrast  Result Date: 10/25/2019 CLINICAL DATA:  Chest pain, PE suspected EXAM: CT ANGIOGRAPHY CHEST WITH CONTRAST TECHNIQUE: Multidetector CT imaging of the chest was performed using the standard protocol during bolus administration of intravenous contrast. Multiplanar CT image reconstructions and MIPs were obtained to evaluate the vascular anatomy. CONTRAST:  48mL OMNIPAQUE IOHEXOL 350 MG/ML SOLN  COMPARISON:  09/10/2016 FINDINGS: Cardiovascular: Satisfactory opacification of the pulmonary arteries to the segmental level. No evidence of pulmonary embolism. Normal heart size. Extensive 3 vessel coronary artery calcifications and/or stents. No pericardial effusion. Severe mixed aortic atherosclerosis. Mediastinum/Nodes: No enlarged mediastinal, hilar, or axillary lymph nodes. Thyroid gland, trachea, and esophagus demonstrate no significant findings. Lungs/Pleura: Lungs are clear. No pleural effusion or pneumothorax. Upper Abdomen: No acute abnormality. Musculoskeletal: No chest wall abnormality. There is a new, although age indeterminate high-grade wedge deformity of the L1 vertebral body, partially imaged. Review of the MIP images confirms the above findings. IMPRESSION: 1. Negative examination for pulmonary embolism. 2. Coronary artery disease. Aortic Atherosclerosis (ICD10-I70.0). 3. New, although age indeterminate high-grade wedge deformity of the L1 vertebral body, partially imaged. Correlate for acute pain and point tenderness. Electronically Signed   By: Eddie Candle M.D.   On: 10/25/2019 17:05   US Venous Img Lower Bilateral (DVT)  Result Date: 10/25/2019 CLINICAL DATA:  86 year old with elevated D-dimer and leg swelling. EXAM: BILATERAL LOWER EXTREMITY VENOUS DOPPLER ULTRASOUND TECHNIQUE: Gray-scale sonography with compression, as well as color and duplex ultrasound, were performed to evaluate the deep venous system(s) from the level of the common femoral vein through the popliteal and proximal calf veins. COMPARISON:  02/12/2013 bilateral lower extremity duplex. FINDINGS: VENOUS Normal compressibility of the common femoral, superficial femoral, and popliteal veins, as well as the visualized calf veins. Visualized portions of profunda femoral vein and great saphenous vein unremarkable. No filling defects to suggest DVT on grayscale or color Doppler imaging. Doppler waveforms show normal direction of  venous flow, normal respiratory plasticity and response to augmentation. OTHER None. Limitations:  none IMPRESSION: No evidence of bilateral lower extremity DVT. Electronically Signed   By: Keith Rake M.D.   On: 10/25/2019 20:59       TIMI Risk Score for Unstable Angina or Non-ST Elevation MI:   The patient's TIMI risk score is 4, which indicates a 20% risk of all cause mortality, new or recurrent myocardial infarction or need for urgent revascularization in the next 14 days.    Assessment and Plan:   1. NSTEMI: Continue IV heparin.  She is intolerant of aspirin.  Could consider adding clopidogrel but would like to make sure she does not have any bleeding issues given her advanced age and frailty.  As noted above, I had a long discussion with the patient and the patient's daughter regarding treatment plan.  I presented the invasive option of cardiac catheterization and potential revascularization with PCI.  She would not be a CABG candidate.  I also presented a conservative therapy option with anticoagulation and medical therapy along with pain management.  The patient preferred the second option of conservative therapy.  The daughter states that this is consistent with what the patient has expressed in the past.  We will therefore continue IV heparin.  Treat angina with nitroglycerin and potentially narcotics if needed.  If she had refractory symptoms, we could reconsider coronary angiography, but the patient states she is quite adamant that she does not want any invasive procedures and most importantly she does not want to be resuscitated if her heart were to stop or she were to stop breathing.  Of note, her grandson graduated from medical school about 10 days ago.  Continue her home dose of statin for hyperlipidemia and carotid artery disease, along with lisinopril for hypertension.  Will add low-dose beta-blocker, metoprolol 12.5 mg twice daily.  Consider adding clopidogrel tomorrow if there  are no bleeding issues on IV heparin.  The patient expressed a preference to stay with [facility] heart care after negative experience with another provider.      For questions or updates, please contact [place] Please consult [email or web address] for contact info under    Signed, Larae Grooms, MD  10/25/2019 9:58 PM

## 2019-10-25 NOTE — ED Notes (Signed)
Assigned bed @ 2124, spoke with RN Ena Dawley.

## 2019-10-25 NOTE — ED Notes (Addendum)
Pt used walker to transfer to the toilet to urinate with assist, pt's daughter called with admitting POC at pt's request, originating facility aware by family member  Fall socks and band applied  [place] at bedside

## 2019-10-25 NOTE — Progress Notes (Signed)
ANTICOAGULATION CONSULT NOTE - Initial Consult  Pharmacy Consult for Heparin  Indication: chest pain/ACS  Allergies  Allergen Reactions  . Aspirin Other (See Comments)    Patient collapsed, was rushed to the hospital and hospitalized for a week.  . Other Nausea And Vomiting and Other (See Comments)    AGENT:  anesthesia  . Scallops [Shellfish Allergy] Nausea And Vomiting    Headaches  . Sulfa Antibiotics Hives  . Docosahexaenoic Acid (Dha) Other (See Comments)    GI upset.  . Codeine Nausea And Vomiting  . Morphine And Related Nausea And Vomiting    Patient Measurements: Height: 5\' 3"  (160 cm) Weight: 54.9 kg (121 lb) IBW/kg (Calculated) : 52.4 Heparin Dosing Weight: 54.9 kg   Vital Signs: Temp: 97.6 F (36.4 C) (06/06 1400) Temp Source: Oral (06/06 1400) BP: 174/84 (06/06 1400) Pulse Rate: 101 (06/06 1400)  Labs: Recent Labs    10/25/19 1416 10/25/19 1626  HGB 12.4  --   HCT 37.6  --   PLT 237  --   LABPROT 12.3  --   INR 1.0  --   CREATININE 0.85  --   TROPONINIHS 25* 416*    Estimated Creatinine Clearance: 36.4 mL/min (by C-G formula based on SCr of 0.85 mg/dL).   Medical History: Past Medical History:  Diagnosis Date  . Anemia    yrs ago  . Arthritis   . Back pain   . Carotid bruit    right side more so than left;but per pt not enough to clean out  . Constipation    metamucil bid  . Diverticulosis    right  . GERD (gastroesophageal reflux disease)    related to some meds;but doesn't take any medications  . History of migraine    stopped with menopause  . Hyperlipidemia    takes crestor daily  . Hypertension    takes Lisinopril daily  . Hypothyroidism    takes Synthroid daily  . Internal hemorrhoids   . Joint pain   . Joint swelling   . Macular degeneration    dry  . Nocturia   . Numbness    left foot  . Osteopenia   . Phlebitis    hx of-70yrs ago right leg;takes Plavix daily but on hold for surgery  . PONV (postoperative nausea  and vomiting)   . Urinary frequency   . Urinary urgency     Medications:  (Not in a hospital admission)   Assessment: Pharmacy consulted to dose heparin in this 86 year old female admitted with ACS/NSTEMI.  No prior anticoag noted. CrCl = 36.4 ml/min  Goal of Therapy:  Heparin level 0.3-0.7 units/ml Monitor platelets by anticoagulation protocol: Yes   Plan:  Give 3300 units bolus x 1 Start heparin infusion at 650 units/hr Check anti-Xa level in 8 hours and daily while on heparin Continue to monitor H&H and platelets  Anaira Seay D 10/25/2019,5:46 PM

## 2019-10-25 NOTE — H&P (Signed)
History and Physical    Michele [name] MHD:[id number] DOB: 1927/08/04 DOA: 10/25/2019   PCP: Derinda Late, MD   Patient coming from: Chest Pain  Chief Complaint: Chest Pain  HPI: Michele [name] is a 86 y.o. female with medical history significant of arthritis, history of carotid bruit, constipation, diverticulosis, GERD, hypertension, hyperlipidemia, hypothyroidism, history of macular degeneration, osteopenia, urinary frequency as well as other comorbidities who presents with chief complaint of chest pain.  States that she developed substernal Left sided chest pain that radiated into the middle described as sharp and severe 10/10 ntensity.  He had associated symptoms of nausea but no vomiting, shortness of breath but no lightheadedness or dizziness.  States that 2 days prior she woke up with diaphoresis and had some severe pain in her left arm. She was given Nitropaste in the ED and denied any shortness breath, nausea, lightheadedness.  She states the pain started shortly after she ate a meal and she states that she is never had this type of pain in the past before.  She yesterday went to get treated for her bunions. [facility] was asked admit this patient for NSTEMI and cardiology will be consulted her EKG did show anterior lateral ST depressions and elevated troponin level.  D-dimer was elevated and so she ended up having a CTA of the chest which showed no evidence of PE.  ED Course: In the ED she had basic blood work done and had troponin cycled.  She had a chest x-ray and EKG.  Covid testing was pending and she also had a CTA of the chest given her elevated D-dimer.  She is given acetaminophen and Nitropaste and placed on a heparin drip  Review of Systems: As per HPI otherwise all other systems reviewed and negative.   Past Medical History:  Diagnosis Date  . Anemia    yrs ago  . Arthritis   . Back pain   . Carotid bruit    right side more so than left;but per pt not enough  to clean out  . Constipation    metamucil bid  . Diverticulosis    right  . GERD (gastroesophageal reflux disease)    related to some meds;but doesn't take any medications  . History of migraine    stopped with menopause  . Hyperlipidemia    takes crestor daily  . Hypertension    takes Lisinopril daily  . Hypothyroidism    takes Synthroid daily  . Internal hemorrhoids   . Joint pain   . Joint swelling   . Macular degeneration    dry  . Nocturia   . Numbness    left foot  . Osteopenia   . Phlebitis    hx of-80yrs ago right leg;takes Plavix daily but on hold for surgery  . PONV (postoperative nausea and vomiting)   . Urinary frequency   . Urinary urgency    Past Surgical History:  Procedure Laterality Date  . BREAST BIOPSY Right 12/1997   neg  . BREAST CYST ASPIRATION Right 06/21/1954   rt fna- milk duct-neg  . COLONOSCOPY    . DILATION AND CURETTAGE OF UTERUS    . OOPHORECTOMY Bilateral   . removal of breast cyst  1998  . removal of neck cyst  1996  . removal of tubes and ovaries     SOCIA HISTORY   reports that she has never smoked. She has never used smokeless tobacco. She reports that she does not drink alcohol or use drugs.  Allergies  Allergen Reactions  . Aspirin Other (See Comments)    Patient collapsed, was rushed to the hospital and hospitalized for a week.  . Other Nausea And Vomiting and Other (See Comments)    AGENT:  anesthesia  . Scallops [Shellfish Allergy] Nausea And Vomiting    Headaches  . Sulfa Antibiotics Hives  . Docosahexaenoic Acid (Dha) Other (See Comments)    GI upset.  . Codeine Nausea And Vomiting  . Morphine And Related Nausea And Vomiting   Family History  Problem Relation Age of Onset  . Heart failure Mother   . Heart failure Father   . Cancer Sister   . Breast cancer Sister 68  . Cancer Brother   . Breast cancer Cousin        3 pat cousins   Prior to Admission medications   Medication Sig Start Date End Date Taking?  Authorizing Provider  amoxicillin-clavulanate (AUGMENTIN) 875-125 MG tablet Take 1 tablet by mouth 2 (two) times daily. 10/23/19  Yes [provider]  DULoxetine (CYMBALTA) 30 MG capsule Take 30 mg by mouth daily. 10/19/19  Yes [provider]  KLOR-CON M10 10 MEQ tablet Take 10 mEq by mouth daily. 10/13/19  Yes [provider]  Javier Docker Oil 300 MG CAPS Take 300 mg by mouth daily.   Yes [provider]  Multiple Vitamins-Minerals (PRESERVISION AREDS 2 PO) Take 1 capsule by mouth 2 (two) times daily.   Yes [provider]  pantoprazole (PROTONIX) 40 MG tablet Take 40 mg by mouth daily. 10/09/19  Yes [provider]  predniSONE (DELTASONE) 50 MG tablet Take 50 mg by mouth daily. 10/23/19  Yes [provider]  SYNTHROID 100 MCG tablet Take 100 mcg by mouth daily. 10/11/18  Yes [provider]  acetaminophen (TYLENOL) 500 MG tablet Take 500 mg by mouth every 4 (four) hours as needed for mild pain or fever.    [provider]  diclofenac Sodium (VOLTAREN) 1 % GEL Apply 2-4 g topically in the morning, at noon, in the evening, and at bedtime. 08/13/19   [provider]  famotidine (PEPCID) 20 MG tablet Take 20 mg by mouth 2 (two) times daily. 08/13/19   [provider]  lisinopril (ZESTRIL) 10 MG tablet Take 10 mg by mouth daily. 12/06/18   [provider]  rosuvastatin (CRESTOR) 10 MG tablet Take 10 mg by mouth at bedtime.     [provider]   Physical Exam: Vitals:   10/25/19 1401 10/25/19 1757 10/25/19 1800 10/25/19 1830  BP:  121/74  125/67  Pulse:  92  87  Resp:  18  18  Temp:      TempSrc:      SpO2:  97% 98% 96%  Weight: 54.9 kg     Height: 5\' 3"  (1.6 m)      Constitutional: Thin elderly Caucasian female currently in NAD and appears calm Eyes: Lids and conjunctivae normal, sclerae anicteric  ENMT: External Ears, Nose appear normal. Grossly normal hearing.  Neck: Appears normal,  supple, no cervical masses, normal ROM, no appreciable thyromegaly; no JVD Respiratory: Diminished to auscultation bilaterally, no wheezing, rales, rhonchi or crackles. Normal respiratory effort and patient is not tachypenic. No accessory muscle use.  Unlabored breathing Cardiovascular: RRR, no murmurs / rubs / gallops. S1 and S2 auscultated.  Has 1+ lower extremity left leg swelling worse than the right Abdomen: Soft, non-tender, non-distended.  Bowel sounds positive.  GU: Deferred. Musculoskeletal: No clubbing / cyanosis  of digits/nails. No joint deformity upper and lower extremities but does have some bunions bilaterally Skin: No rashes, lesions, ulcers on limited skin evaluation. No induration; Warm and dry.  Neurologic: CN 2-12 grossly intact with no focal deficits. Romberg sign and cerebellar reflexes not assessed.  Psychiatric: Normal judgment and insight. Alert and oriented x 3. Normal mood and appropriate affect.   Labs on Admission: I have personally reviewed following labs and imaging studies  CBC: Recent Labs  Lab 10/25/19 1416  WBC 9.4  NEUTROABS 8.1*  HGB 12.4  HCT 37.6  MCV 91.9  PLT 371   Basic Metabolic Panel: Recent Labs  Lab 10/25/19 1416  NA 138  K 4.2  CL 103  CO2 24  GLUCOSE 217*  BUN 27*  CREATININE 0.85  CALCIUM 9.3  MG 1.9  PHOS 2.3*   GFR: Estimated Creatinine Clearance: 36.4 mL/min (by C-G formula based on SCr of 0.85 mg/dL). Liver Function Tests: Recent Labs  Lab 10/25/19 1416  AST 27  ALT 16  ALKPHOS 60  BILITOT 0.8  PROT 7.0  ALBUMIN 4.1   Recent Labs  Lab 10/25/19 1416  LIPASE 54*   No results for input(s): AMMONIA in the last 168 hours. Coagulation Profile: Recent Labs  Lab 10/25/19 1416  INR 1.0   Cardiac Enzymes: No results for input(s): CKTOTAL, CKMB, CKMBINDEX, TROPONINI in the last 168 hours. BNP (last 3 results) No results for input(s): PROBNP in the last 8760 hours. HbA1C: No results for input(s): HGBA1C in the  last 72 hours. CBG: No results for input(s): GLUCAP in the last 168 hours. Lipid Profile: No results for input(s): CHOL, HDL, LDLCALC, TRIG, CHOLHDL, LDLDIRECT in the last 72 hours. Thyroid Function Tests: No results for input(s): TSH, T4TOTAL, FREET4, T3FREE, THYROIDAB in the last 72 hours. Anemia Panel: No results for input(s): VITAMINB12, FOLATE, FERRITIN, TIBC, IRON, RETICCTPCT in the last 72 hours. Urine analysis:    Component Value Date/Time   COLORURINE YELLOW (A) 12/27/2018 0950   APPEARANCEUR CLEAR (A) 12/27/2018 0950   LABSPEC 1.019 12/27/2018 0950   PHURINE 6.0 12/27/2018 0950   GLUCOSEU NEGATIVE 12/27/2018 0950   HGBUR NEGATIVE 12/27/2018 0950   BILIRUBINUR NEGATIVE 12/27/2018 0950   KETONESUR 20 (A) 12/27/2018 0950   PROTEINUR 30 (A) 12/27/2018 0950   NITRITE NEGATIVE 12/27/2018 0950   LEUKOCYTESUR NEGATIVE 12/27/2018 0950   Sepsis Labs: !!!!!!!!!!!!!!!!!!!!!!!!!!!!!!!!!!!!!!!!!!!! @LABRCNTIP (procalcitonin:4,lacticidven:4) ) Recent Results (from the past 240 hour(s))  SARS Coronavirus 2 by RT PCR (hospital order, performed in [place] hospital lab) Nasopharyngeal Nasopharyngeal Swab     Status: None   Collection Time: 10/25/19  5:37 PM   Specimen: Nasopharyngeal Swab  Result Value Ref Range Status   SARS Coronavirus 2 NEGATIVE NEGATIVE Final    Comment: (NOTE) SARS-CoV-2 target nucleic acids are NOT DETECTED. The SARS-CoV-2 RNA is generally detectable in upper and lower respiratory specimens during the acute phase of infection. The lowest concentration of SARS-CoV-2 viral copies this assay can detect is 250 copies / mL. A negative result does not preclude SARS-CoV-2 infection and should not be used as the sole basis for treatment or other patient management decisions.  A negative result may occur with improper specimen collection / handling, submission of specimen other than nasopharyngeal swab, presence of viral mutation(s) within the areas targeted by this  assay, and inadequate number of viral copies (<250 copies / mL). A negative result must be combined with clinical observations, patient history, and epidemiological information. Fact Sheet for Patients:  StrictlyIdeas.no Fact Sheet for Healthcare Providers: BankingDealers.co.za This test is not yet approved or cleared  by the [place] FDA and has been authorized for detection and/or diagnosis of SARS-CoV-2 by FDA under an Emergency Use Authorization (EUA).  This EUA will remain in effect (meaning this test can be used) for the duration of the COVID-19 declaration under Section 564(b)(1) of the Act, 21 U.S.C. section 360bbb-3(b)(1), unless the authorization is terminated or revoked sooner. Performed at [facility], [address], [place],  [address]      Radiological Exams on Admission: DG Chest 2 View  Result Date: 10/25/2019 CLINICAL DATA:  Patient with chest pain. EXAM: CHEST - 2 VIEW COMPARISON:  Chest radiograph 09/10/2016. FINDINGS: Normal cardiac and mediastinal contours. Tortuosity of the thoracic aorta. No large area pulmonary consolidation. No pleural effusion or pneumothorax. Emphysematous changes. Redemonstrated T12 compression deformity. IMPRESSION: No acute cardiopulmonary process. Electronically Signed   By: Lovey Newcomer M.D.   On: 10/25/2019 15:18   CT Angio Chest PE W and/or Wo Contrast  Result Date: 10/25/2019 CLINICAL DATA:  Chest pain, PE suspected EXAM: CT ANGIOGRAPHY CHEST WITH CONTRAST TECHNIQUE: Multidetector CT imaging of the chest was performed using the standard protocol during bolus administration of intravenous contrast. Multiplanar CT image reconstructions and MIPs were obtained to evaluate the vascular anatomy. CONTRAST:  42mL OMNIPAQUE IOHEXOL 350 MG/ML SOLN COMPARISON:  09/10/2016 FINDINGS: Cardiovascular: Satisfactory opacification of the pulmonary arteries to the segmental level. No  evidence of pulmonary embolism. Normal heart size. Extensive 3 vessel coronary artery calcifications and/or stents. No pericardial effusion. Severe mixed aortic atherosclerosis. Mediastinum/Nodes: No enlarged mediastinal, hilar, or axillary lymph nodes. Thyroid gland, trachea, and esophagus demonstrate no significant findings. Lungs/Pleura: Lungs are clear. No pleural effusion or pneumothorax. Upper Abdomen: No acute abnormality. Musculoskeletal: No chest wall abnormality. There is a new, although age indeterminate high-grade wedge deformity of the L1 vertebral body, partially imaged. Review of the MIP images confirms the above findings. IMPRESSION: 1. Negative examination for pulmonary embolism. 2. Coronary artery disease. Aortic Atherosclerosis (ICD10-I70.0). 3. New, although age indeterminate high-grade wedge deformity of the L1 vertebral body, partially imaged. Correlate for acute pain and point tenderness. Electronically Signed   By: Eddie Candle M.D.   On: 10/25/2019 17:05    EKG: Independently reviewed.  Showed a sinus rhythm with a first-degree AV block at a rate of 97 and a QTC level of 467 and ST depression in anteriolateral leads  Assessment/Plan Active Problems:   Chest pain   NSTEMI (non-ST elevated myocardial infarction) (HCC)   Essential hypertension   Hyperlipidemia   Hypothyroidism   Left leg swelling  Acute NSTEMI -Admit to inpatient cardiac progressive unit -Placed on a heparin drip and will continue for Now -Check ECHOCardiogram -Cannot use aspirin given her allergy we will continue with nitroglycerin, morphine, statin -Cardiology will be consulted for further evaluation recommendations -Continue monitor and repeat EKG in AM -Check ECHOCardiogram -Troponin level went from 25 and is now 416 -We will check magnesium and phosphorus level  -She had elevated fibrin derivatives D-dimer of 2240.71 and CTA PE done but Left Leg more swollen than Right so will get Venous  Duplex -CTA PE done and showed "Negative examination for pulmonary embolism.  Coronary artery disease. Aortic Atherosclerosis (ICD10-I70.0).  New, although age indeterminate high-grade wedge deformity of the L1 vertebral body, partially imaged. Correlate for acute pain and point tenderness." -CXR showed "Normal cardiac and mediastinal contours. Tortuosity of the thoracic aorta. No large area pulmonary  consolidation. No pleural effusion or pneumothorax. Emphysematous changes. Redemonstrated T12 compression deformity. -Check FLP, hemoglobin A1c and TSH -Of Not SARS-CoV-2 Testing pending   Hypertension -C/w Lisinopril 10 mg po Daily -Continue to Monitor BP per Protocol  -BP in the ED was Elevated   Hyperlipidemia -Check lipid panel in the a.m. -Increase rosuvastatin dosing -Takes Krill Oil 300 mg po Daily   GERD -C/w PPI with Pantoprazole 40 mg po Daily and with Famotidine 20 mg po BID  Hypothyroidism -Check TSH -C/w Levothyroxine 100 mcg po Daily   Hyperglycemia -Check Hemoglobin A1c in the a.m. -Blood sugar on admission was 217 -Continue to monitor blood sugars carefully and if necessary will place on sensitive NovoLog/scale insulin AC  L1 Wedge Defortmity in the setting of her back surgery -C/w Acteaminopehn   Bunions -Continue with Augmentin and will hold her prednisone for now  Left Leg Swelling > Right Leg -Check LE Duplex to r/o DVT -C/w Heparin gtt as above   DVT prophylaxis: Anticoagulated with Heparin gtt Code Status: DO NOT RESUSCITATE Family Communication: No family present at bedside Disposition Plan: Admit to Inpatient Progressive Cardiac and pending clearance by Cardiology  Consults called: EDP to Call Cardiology  Admission status: Inpatient Progressive Cardiac with Telemetry  Severity of Illness: The appropriate patient status for this patient is INPATIENT. Inpatient status is judged to be reasonable and necessary in order to provide the required intensity  of service to ensure the patient's safety. The patient's presenting symptoms, physical exam findings, and initial radiographic and laboratory data in the context of their chronic comorbidities is felt to place them at high risk for further clinical deterioration. Furthermore, it is not anticipated that the patient will be medically stable for discharge from the hospital within 2 midnights of admission. The following factors support the patient status of inpatient.   " The patient's presenting symptoms include Chest Pain. " The worrisome physical exam findings include left leg swelling compared to right. " The initial radiographic and laboratory data are worrisome because of elevated troponin. " The chronic co-morbidities are listed as above  * I certify that at the point of admission it is my clinical judgment that the patient will require inpatient hospital care spanning beyond 2 midnights from the point of admission due to high intensity of service, high risk for further deterioration and high frequency of surveillance required.*  Status is: Inpatient  Remains inpatient appropriate because:Unsafe d/c plan, IV treatments appropriate due to intensity of illness or inability to take PO and Inpatient level of care appropriate due to severity of illness   Dispo: The patient is from: ALF              Anticipated d/c is to: ALF              Anticipated d/c date is: 2 days              Patient currently is not medically stable to d/c.  Kerney Elbe, D.O. [facility] PAGER is on [place]  If 7PM-7AM, please contact night-coverage [email or web address]  10/25/2019, 7:00 PM

## 2019-10-25 NOTE — ED Notes (Signed)
Daughter Mickel Baas [facility]) given update and informed that pt was going to be admitted

## 2019-10-25 NOTE — ED Notes (Signed)
Pt taken for CT scan 

## 2019-10-25 NOTE — ED Provider Notes (Signed)
5:23 PM CT negative for PE. Her delta troponin rose from 25 to 416. Given her history, labs, and abnormal EKG w/ anterolateral changes, will plan to initiate heparin.  Unable to receive aspirin due to allergy.  Discussed risks/benefits of heparin w/ patient, she is agreeable. She denies any recent surgery or trauma, no blood in the urine or bowels.  Reports improvement in her chest pain after medication.  Will admit.  Discussed with hospitalist for admission.  Also discussed with on-call cardiology [facility], who is aware of the patient's admission.  .Critical Care Performed by: Lilia Pro., MD Authorized by: Lilia Pro., MD   Critical care provider statement:    Critical care time (minutes):  35   Critical care was necessary to treat or prevent imminent or life-threatening deterioration of the following conditions: NSTEMI.   Critical care was time spent personally by me on the following activities:  Evaluation of patient's response to treatment, examination of patient, ordering and performing treatments and interventions, ordering and review of laboratory studies, ordering and review of radiographic studies, pulse oximetry, re-evaluation of patient's condition, obtaining history from patient or surrogate, review of old charts and discussions with consultants      Lilia Pro., MD 10/26/19 [phone]

## 2019-10-25 NOTE — ED Notes (Signed)
Pt from hallway bed 1 to bed 19.  Presenting with c/o chest pain after lunch.  Currently GCS 15, PEARL, rr even/unlabored, skin warm/pink/dry.  VSS. Denies chest pain at this time +PMSC x4. Placed on bedside cardiac monitor. Will continue to monitor/reassess.

## 2019-10-25 NOTE — ED Notes (Signed)
First call report, Maricar, RN passing meds and will call me back

## 2019-10-25 NOTE — ED Notes (Signed)
Lab called to add on D-dimer and lipase.

## 2019-10-25 NOTE — ED Triage Notes (Signed)
Pt [place] via EMS [place] d/t chest pain that the pt describes as pressure that started 20 minutes ago- pt was hypertensive per EMS- pt was given 1 inch nitropaste d/t allergy to ASA- pt states that it does not radiate

## 2019-10-25 NOTE — ED Provider Notes (Signed)
[facility] Emergency Department Provider Note ____________________________________________   First MD Initiated Contact with Patient 10/25/19 1407     (approximate)  I have reviewed the triage vital signs and the nursing notes.   HISTORY  Chief Complaint Chest Pain    HPI Michele [name] is a 86 y.o. female with PMH as noted below who presents with chest pain, substernal and somewhat radiating to the left, described as sharp, and severe in intensity.  It was not relieved by nitro paste given by EMS.  She denies significant shortness of breath, nausea or lightheadedness.  She denies leg pain or swelling, other than a bunion.  The pain started shortly after the patient ate a meal.  She states that she was started on Augmentin and prednisone yesterday for a possible infection around the bunion on her foot.  She denies other new medications.  She has had no prior history of pain like this.   Past Medical History:  Diagnosis Date  . Anemia    yrs ago  . Arthritis   . Back pain   . Carotid bruit    right side more so than left;but per pt not enough to clean out  . Constipation    metamucil bid  . Diverticulosis    right  . GERD (gastroesophageal reflux disease)    related to some meds;but doesn't take any medications  . History of migraine    stopped with menopause  . Hyperlipidemia    takes crestor daily  . Hypertension    takes Lisinopril daily  . Hypothyroidism    takes Synthroid daily  . Internal hemorrhoids   . Joint pain   . Joint swelling   . Macular degeneration    dry  . Nocturia   . Numbness    left foot  . Osteopenia   . Phlebitis    hx of-54yrs ago right leg;takes Plavix daily but on hold for surgery  . PONV (postoperative nausea and vomiting)   . Urinary frequency   . Urinary urgency     Patient Active Problem List   Diagnosis Date Noted  . Chest pain 09/10/2016    Past Surgical History:  Procedure Laterality  Date  . BREAST BIOPSY Right 12/1997   neg  . BREAST CYST ASPIRATION Right 06/21/1954   rt fna- milk duct-neg  . COLONOSCOPY    . DILATION AND CURETTAGE OF UTERUS    . OOPHORECTOMY Bilateral   . removal of breast cyst  1998  . removal of neck cyst  1996  . removal of tubes and ovaries      Prior to Admission medications   Medication Sig Start Date End Date Taking? Authorizing Provider  amoxicillin-clavulanate (AUGMENTIN) 875-125 MG tablet Take 1 tablet by mouth 2 (two) times daily. 10/23/19  Yes [provider]  DULoxetine (CYMBALTA) 30 MG capsule Take 30 mg by mouth daily. 10/19/19  Yes [provider]  KLOR-CON M10 10 MEQ tablet Take 10 mEq by mouth daily. 10/13/19  Yes [provider]  Javier Docker Oil 300 MG CAPS Take 300 mg by mouth daily.   Yes [provider]  Multiple Vitamins-Minerals (PRESERVISION AREDS 2 PO) Take 1 capsule by mouth 2 (two) times daily.   Yes [provider]  pantoprazole (PROTONIX) 40 MG tablet Take 40 mg by mouth daily. 10/09/19  Yes [provider]  predniSONE (DELTASONE) 50 MG tablet Take 50 mg by mouth daily. 10/23/19  Yes [provider]  Wilmer Floor  100 MCG tablet Take 100 mcg by mouth daily. 10/11/18  Yes [provider]  acetaminophen (TYLENOL) 500 MG tablet Take 500 mg by mouth every 4 (four) hours as needed for mild pain or fever.    [provider]  diclofenac Sodium (VOLTAREN) 1 % GEL Apply 2-4 g topically in the morning, at noon, in the evening, and at bedtime. 08/13/19   [provider]  famotidine (PEPCID) 20 MG tablet Take 20 mg by mouth 2 (two) times daily. 08/13/19   [provider]  lisinopril (ZESTRIL) 10 MG tablet Take 10 mg by mouth daily. 12/06/18   [provider]  rosuvastatin (CRESTOR) 10 MG tablet Take 10 mg by mouth at bedtime.     [provider]    Allergies Aspirin, Other, Scallops [shellfish allergy], Sulfa antibiotics, Docosahexaenoic  acid (dha), Codeine, and Morphine and related  Family History  Problem Relation Age of Onset  . Heart failure Mother   . Heart failure Father   . Cancer Sister   . Breast cancer Sister 25  . Cancer Brother   . Breast cancer Cousin        3 pat cousins    Social History Social History   Tobacco Use  . Smoking status: Never Smoker  . Smokeless tobacco: Never Used  Substance Use Topics  . Alcohol use: No  . Drug use: No    Review of Systems  Constitutional: No fever/chills. Eyes: No redness. ENT: No neck pain. Cardiovascular: Positive for chest pain. Respiratory: Denies shortness of breath. Gastrointestinal: No vomiting or diarrhea.  Genitourinary: Negative for flank pain.  Musculoskeletal: Negative for back pain. Skin: Negative for rash. Neurological: Negative for headache.   ____________________________________________   PHYSICAL EXAM:  VITAL SIGNS: ED Triage Vitals  Enc Vitals Group     BP 10/25/19 1400 (!) 174/84     Pulse Rate 10/25/19 1400 (!) 101     Resp 10/25/19 1400 20     Temp 10/25/19 1400 97.6 F (36.4 C)     Temp Source 10/25/19 1400 Oral     SpO2 10/25/19 1400 98 %     Weight 10/25/19 1401 121 lb (54.9 kg)     Height 10/25/19 1401 5\' 3"  (1.6 m)     Head Circumference --      Peak Flow --      Pain Score 10/25/19 1401 10     Pain Loc --      Pain Edu? --      Excl. in [place]? --     Constitutional: Alert and oriented.  Uncomfortable appearing but in no acute distress. Eyes: Conjunctivae are normal.  Head: Atraumatic. Nose: No congestion/rhinnorhea. Mouth/Throat: Mucous membranes are moist.   Neck: Normal range of motion.  Cardiovascular: Normal rate, regular rhythm. Grossly normal heart sounds.  Good peripheral circulation. Respiratory: Normal respiratory effort.  No retractions. Lungs CTAB. Gastrointestinal: Soft and nontender. No distention.  Genitourinary: No flank tenderness. Musculoskeletal: No lower extremity edema.  No calf or  popliteal swelling or tenderness.  Extremities warm and well perfused.  Neurologic:  Normal speech and language. No gross focal neurologic deficits are appreciated.  Skin:  Skin is warm and dry. No rash noted. Psychiatric: Mood and affect are normal. Speech and behavior are normal.  ____________________________________________   LABS (all labs ordered are listed, but only abnormal results are displayed)  Labs Reviewed  CBC WITH DIFFERENTIAL/PLATELET - Abnormal; Notable for the following components:      Result Value  Neutro Abs 8.1 (*)    Abs Immature Granulocytes 0.11 (*)    All other components within normal limits  COMPREHENSIVE METABOLIC PANEL - Abnormal; Notable for the following components:   Glucose, Bld 217 (*)    BUN 27 (*)    All other components within normal limits  LIPASE, BLOOD - Abnormal; Notable for the following components:   Lipase 54 (*)    All other components within normal limits  FIBRIN DERIVATIVES D-DIMER ([facility] ONLY) - Abnormal; Notable for the following components:   Fibrin derivatives D-dimer ([facility]) 2,240.71 (*)    All other components within normal limits  TROPONIN I (HIGH SENSITIVITY) - Abnormal; Notable for the following components:   Troponin I (High Sensitivity) 25 (*)    All other components within normal limits  PROTIME-INR  TROPONIN I (HIGH SENSITIVITY)   ____________________________________________  EKG  ED ECG REPORT I, Arta Silence, the attending physician, personally viewed and interpreted this ECG.  Date: 10/25/2019 EKG Time: 1405 Rate: 97 Rhythm: normal sinus rhythm QRS Axis: normal Intervals: normal ST/T Wave abnormalities: Nonspecific lateral ST depression in V3 through V6 Narrative Interpretation: New lateral ST depressions when compared to EKG of 12/27/2018  ____________________________________________  RADIOLOGY  CXR: No acute findings CT angio chest:  Pending  ____________________________________________   PROCEDURES  Procedure(s) performed: No  Procedures  Critical Care performed: No ____________________________________________   INITIAL IMPRESSION / ASSESSMENT AND PLAN / ED COURSE  Pertinent labs & imaging results that were available during my care of the patient were reviewed by me and considered in my medical decision making (see chart for details).  86 year old female with PMH as noted above presents with atypical chest pain acute onset after eating a meal.  She denies any prior history of this pain, and has no significant cardiac history.  I reviewed the past medical records in [place].  The patient was most recently seen in the ED last year after a fall.  She has had no recent admissions.  She does not have any documented CAD history.  On exam she is uncomfortable appearing but in no acute distress.  She is hypertensive with otherwise normal vital signs.  The physical exam is otherwise unremarkable.  EKG does show some minimal anterolateral ST depressions when compared to prior EKGs.  Differential includes ACS, musculoskeletal pain, GERD or other GI etiology, or less likely PE.  We will obtain a chest x-ray, basic labs, cardiac enzymes, D-dimer, and reassess.  Anticipate that she will need admission for further monitoring given the EKG changes.  ----------------------------------------- 4:15 PM on 10/25/2019 -----------------------------------------  The patient's pain improved significantly after Tylenol.  Initial troponin is slightly elevated.  The D-dimer is significantly elevated.  I have ordered a CT chest to rule out PE.  We will plan for admission once this has resulted.  I signed the patient out to the oncoming physician Dr. Joan Mayans.  ____________________________________________   FINAL CLINICAL IMPRESSION(S) / ED DIAGNOSES  Final diagnoses:  Chest pain, unspecified type      NEW MEDICATIONS STARTED DURING  THIS VISIT:  New Prescriptions   No medications on file     Note:  This document was prepared using Dragon voice recognition software and may include unintentional dictation errors.   Arta Silence, MD 10/25/19 1630

## 2019-10-26 ENCOUNTER — Other Ambulatory Visit: Payer: Self-pay

## 2019-10-26 ENCOUNTER — Inpatient Hospital Stay (HOSPITAL_COMMUNITY)
Admit: 2019-10-26 | Discharge: 2019-10-26 | Disposition: A | Discharge status: Home / Self Care | Payer: Medicare HMO | Attending: Internal Medicine | Admitting: Internal Medicine

## 2019-10-26 DIAGNOSIS — I340 Nonrheumatic mitral (valve) insufficiency: Secondary | ICD-10-CM

## 2019-10-26 DIAGNOSIS — I342 Nonrheumatic mitral (valve) stenosis: Secondary | ICD-10-CM

## 2019-10-26 DIAGNOSIS — E782 Mixed hyperlipidemia: Secondary | ICD-10-CM

## 2019-10-26 LAB — CBC WITH DIFFERENTIAL/PLATELET
Abs Immature Granulocytes: 0.03 10*3/uL (ref 0.00–0.07)
Basophils Absolute: 0 10*3/uL (ref 0.0–0.1)
Basophils Relative: 0 %
Eosinophils Absolute: 0 10*3/uL (ref 0.0–0.5)
Eosinophils Relative: 0 %
HCT: 33.4 % — ABNORMAL LOW (ref 36.0–46.0)
Hemoglobin: 11.1 g/dL — ABNORMAL LOW (ref 12.0–15.0)
Immature Granulocytes: 0 %
Lymphocytes Relative: 28 %
Lymphs Abs: 2.8 10*3/uL (ref 0.7–4.0)
MCH: 30.2 pg (ref 26.0–34.0)
MCHC: 33.2 g/dL (ref 30.0–36.0)
MCV: 91 fL (ref 80.0–100.0)
Monocytes Absolute: 1 10*3/uL (ref 0.1–1.0)
Monocytes Relative: 10 %
Neutro Abs: 6.1 10*3/uL (ref 1.7–7.7)
Neutrophils Relative %: 62 %
Platelets: 225 10*3/uL (ref 150–400)
RBC: 3.67 MIL/uL — ABNORMAL LOW (ref 3.87–5.11)
RDW: 13.2 % (ref 11.5–15.5)
WBC: 9.9 10*3/uL (ref 4.0–10.5)
nRBC: 0 % (ref 0.0–0.2)

## 2019-10-26 LAB — COMPREHENSIVE METABOLIC PANEL
ALT: 15 U/L (ref 0–44)
AST: 35 U/L (ref 15–41)
Albumin: 3.5 g/dL (ref 3.5–5.0)
Alkaline Phosphatase: 47 U/L (ref 38–126)
Anion gap: 7 (ref 5–15)
BUN: 22 mg/dL (ref 8–23)
CO2: 29 mmol/L (ref 22–32)
Calcium: 9 mg/dL (ref 8.9–10.3)
Chloride: 104 mmol/L (ref 98–111)
Creatinine, Ser: 0.78 mg/dL (ref 0.44–1.00)
GFR calc Af Amer: >60 mL/min (ref >60)
GFR calc non Af Amer: >60 mL/min (ref >60)
Glucose, Bld: 97 mg/dL (ref 70–99)
Potassium: 4.1 mmol/L (ref 3.5–5.1)
Sodium: 140 mmol/L (ref 135–145)
Total Bilirubin: 0.8 mg/dL (ref 0.3–1.2)
Total Protein: 6 g/dL — ABNORMAL LOW (ref 6.5–8.1)

## 2019-10-26 LAB — HEPARIN LEVEL (UNFRACTIONATED)
Heparin Unfractionated: 0.51 IU/mL (ref 0.30–0.70)
Heparin Unfractionated: 0.51 IU/mL (ref 0.30–0.70)

## 2019-10-26 LAB — TSH: TSH: 0.294 u[IU]/mL — ABNORMAL LOW (ref 0.350–4.500)

## 2019-10-26 LAB — ECHOCARDIOGRAM COMPLETE
Height: 63 in
Weight: 1961.6 oz

## 2019-10-26 LAB — LIPID PANEL
Cholesterol: 271 mg/dL — ABNORMAL HIGH (ref 0–200)
HDL: 54 mg/dL (ref >40)
LDL Cholesterol: 197 mg/dL — ABNORMAL HIGH (ref 0–99)
Total CHOL/HDL Ratio: 5 RATIO
Triglycerides: 101 mg/dL (ref <150)
VLDL: 20 mg/dL (ref 0–40)

## 2019-10-26 LAB — MAGNESIUM: Magnesium: 1.9 mg/dL (ref 1.7–2.4)

## 2019-10-26 LAB — TROPONIN I (HIGH SENSITIVITY): Troponin I (High Sensitivity): 6728 ng/L (ref <18)

## 2019-10-26 LAB — HEMOGLOBIN A1C
Hgb A1c MFr Bld: 5.8 % — ABNORMAL HIGH (ref 4.8–5.6)
Mean Plasma Glucose: 119.76 mg/dL

## 2019-10-26 LAB — MRSA PCR SCREENING: MRSA by PCR: NEGATIVE

## 2019-10-26 LAB — GLUCOSE, CAPILLARY: Glucose-Capillary: 100 mg/dL — ABNORMAL HIGH (ref 70–99)

## 2019-10-26 LAB — PHOSPHORUS: Phosphorus: 3.1 mg/dL (ref 2.5–4.6)

## 2019-10-26 MED ORDER — CLOPIDOGREL BISULFATE 75 MG PO TABS
75.0000 mg | ORAL_TABLET | Freq: Every day | ORAL | Status: DC
  Administered 2019-10-26 – 2019-10-28 (×3): 75 mg via ORAL
  Filled 2019-10-26 (×3): qty 1

## 2019-10-26 MED ORDER — OCUVITE-LUTEIN PO CAPS
1.0000 | ORAL_CAPSULE | Freq: Every day | ORAL | Status: DC
  Administered 2019-10-26 – 2019-10-28 (×3): 1 via ORAL
  Filled 2019-10-26 (×4): qty 1

## 2019-10-26 MED ORDER — ATORVASTATIN CALCIUM 20 MG PO TABS
40.0000 mg | ORAL_TABLET | Freq: Every evening | ORAL | Status: DC
  Administered 2019-10-26 – 2019-10-27 (×2): 40 mg via ORAL
  Filled 2019-10-26 (×2): qty 2

## 2019-10-26 MED ORDER — SODIUM CHLORIDE 0.9% FLUSH
10.0000 mL | Freq: Two times a day (BID) | INTRAVENOUS | Status: DC
  Administered 2019-10-26 – 2019-10-28 (×4): 10 mL via INTRAVENOUS

## 2019-10-26 MED ORDER — NITROGLYCERIN 0.4 MG SL SUBL: 0.4000 mg | SUBLINGUAL_TABLET | SUBLINGUAL | Status: DC | PRN

## 2019-10-26 NOTE — Progress Notes (Signed)
ANTICOAGULATION CONSULT NOTE - Initial Consult  Pharmacy Consult for Heparin  Indication: chest pain/ACS  Allergies  Allergen Reactions  . Aspirin Other (See Comments)    Patient collapsed, was rushed to the hospital and hospitalized for a week.  . Other Nausea And Vomiting and Other (See Comments)    AGENT:  anesthesia  . Scallops [Shellfish Allergy] Nausea And Vomiting    Headaches  . Sulfa Antibiotics Hives  . Docosahexaenoic Acid (Dha) Other (See Comments)    GI upset.  . Codeine Nausea And Vomiting  . Morphine And Related Nausea And Vomiting    Patient Measurements: Height: 5\' 3"  (160 cm) Weight: 55.6 kg (122 lb 9.6 oz) IBW/kg (Calculated) : 52.4 Heparin Dosing Weight: 54.9 kg   Vital Signs: Temp: 97.9 F (36.6 C) (06/07 0804) Temp Source: Oral (06/07 0804) BP: 114/61 (06/07 0804) Pulse Rate: 64 (06/07 0804)  Labs: Recent Labs    10/25/19 1416 10/25/19 1626 10/25/19 2157 10/25/19 2253 10/26/19 0030 10/26/19 0204 10/26/19 0954  HGB 12.4  --   --   --   --  11.1*  --   HCT 37.6  --   --   --   --  33.4*  --   PLT 237  --   --   --   --  225  --   APTT 28  --   --   --   --   --   --   LABPROT 12.3  --   --   --   --   --   --   INR 1.0  --   --   --   --   --   --   HEPARINUNFRC  --   --   --   --   --  0.51 0.51  CREATININE 0.85  --   --   --   --  0.78  --   TROPONINIHS 25*   < > 5,580* 5,859* 6,728*  --   --    < > = values in this interval not displayed.    Estimated Creatinine Clearance: 38.7 mL/min (by C-G formula based on SCr of 0.78 mg/dL).   Medical History: Past Medical History:  Diagnosis Date  . Anemia    yrs ago  . Arthritis   . Back pain   . Carotid bruit    right side more so than left;but per pt not enough to clean out  . Constipation    metamucil bid  . Diverticulosis    right  . GERD (gastroesophageal reflux disease)    related to some meds;but doesn't take any medications  . History of migraine    stopped with menopause   . Hyperlipidemia    takes crestor daily  . Hypertension    takes Lisinopril daily  . Hypothyroidism    takes Synthroid daily  . Internal hemorrhoids   . Joint pain   . Joint swelling   . Macular degeneration    dry  . Nocturia   . Numbness    left foot  . Osteopenia   . Phlebitis    hx of-64yrs ago right leg;takes Plavix daily but on hold for surgery  . PONV (postoperative nausea and vomiting)   . Urinary frequency   . Urinary urgency     Medications:  Medications Prior to Admission  Medication Sig Dispense Refill Last Dose  . amoxicillin-clavulanate (AUGMENTIN) 875-125 MG tablet Take 1 tablet by mouth 2 (two) times daily.  10/25/2019 at 0759  . DULoxetine (CYMBALTA) 30 MG capsule Take 30 mg by mouth daily.   10/25/2019 at 0759  . KLOR-CON M10 10 MEQ tablet Take 10 mEq by mouth daily.   10/25/2019 at 0759  . Krill Oil 300 MG CAPS Take 300 mg by mouth daily.   10/25/2019 at 0759  . Multiple Vitamins-Minerals (PRESERVISION AREDS 2 PO) Take 1 capsule by mouth 2 (two) times daily.   10/25/2019 at 0759  . pantoprazole (PROTONIX) 40 MG tablet Take 40 mg by mouth daily.   10/25/2019 at [place]  . predniSONE (DELTASONE) 50 MG tablet Take 50 mg by mouth daily.   10/25/2019 at 0759  . SYNTHROID 100 MCG tablet Take 100 mcg by mouth daily.   10/25/2019 at [place]  . acetaminophen (TYLENOL) 500 MG tablet Take 500 mg by mouth every 4 (four) hours as needed for mild pain or fever.   10/23/2019 at 2145  . diclofenac Sodium (VOLTAREN) 1 % GEL Apply 2-4 g topically in the morning, at noon, in the evening, and at bedtime.   Not Taking at Unknown time  . famotidine (PEPCID) 20 MG tablet Take 20 mg by mouth 2 (two) times daily.   unknown at prn  . lisinopril (ZESTRIL) 10 MG tablet Take 10 mg by mouth daily.   Not Taking at Unknown time  . rosuvastatin (CRESTOR) 10 MG tablet Take 10 mg by mouth at bedtime.    Not Taking at Unknown time    Assessment: Pharmacy consulted to dose heparin in this 86 year old female  admitted with ACS/NSTEMI.  No prior anticoag noted. CrCl = 36.4 ml/min  6/7 0204 HL 0.51 6/7 0954 HL 0.51   Goal of Therapy:  Heparin level 0.3-0.7 units/ml Monitor platelets by anticoagulation protocol: Yes   Plan:  Heparin level is therapeutic. Will continue current rate and will recheck HL and CBC with AM labs. CBC trended down slightly but stable, will continue to monitor.  Eleonore Chiquito, PharmD, BCPS Clinical Pharmacist 10/26/2019,11:16 AM

## 2019-10-26 NOTE — Progress Notes (Signed)
PROGRESS NOTE    Michele [name]  HUT:[id number] DOB: 1927/10/20 DOA: 10/25/2019 PCP: Kirk Ruths, MD   Brief Narrative:  Michele [name] is a 86 y.o. female with medical history significant of arthritis, history of carotid bruit, constipation, diverticulosis, GERD, hypertension, hyperlipidemia, hypothyroidism, history of macular degeneration, osteopenia, urinary frequency as well as other comorbidities who presents with chief complaint of chest pain.  States that she developed substernal Left sided chest pain that radiated into the middle described as sharp and severe 10/10 ntensity.  He had associated symptoms of nausea but no vomiting, shortness of breath but no lightheadedness or dizziness.  States that 2 days prior she woke up with diaphoresis and had some severe pain in her left arm. She was given Nitropaste in the ED and denied any shortness breath, nausea, lightheadedness.  She states the pain started shortly after she ate a meal and she states that she is never had this type of pain in the past before.  She yesterday went to get treated for her bunions. TR[facility] was asked admit this patient for NSTEMI and cardiology will be consulted her EKG did show anterior lateral ST depressions and elevated troponin level.  D-dimer was elevated and so she ended up having a CTA of the chest which showed no evidence of PE.  ED Course: In the ED she had basic blood work done and had troponin cycled.  She had a chest x-ray and EKG.  Covid testing was pending and she also had a CTA of the chest given her elevated D-dimer.  She is given acetaminophen and Nitropaste and placed on a heparin drip  **Interim History Cardiology was consulted and they recommend continue her home dose of statin for hyperlipidemia and carotid artery disease.  Also recommend continue lisinopril for hypertension and have added a low-dose beta-blocker with metoprolol 12.5 mg p.o. twice daily.  They are going to consider  adding clopidogrel today if she has no bleeding issues while on IV heparin..  Since she is intolerant of ASA she will not be given this.  Urology discussed potential options with her and present invasive option of cardiac catheterization potential revascularization with PCI with the understanding that she is not a CABG candidate and he also presented conservative therapy with anticoagulation medical therapy and currently the patient elects medical therapy.  She had some more chest pain this morning and cardiology recommends treating angina with nitroglycerin potentially narcotics if needed  Assessment & Plan:   Active Problems:   Chest pain   NSTEMI (non-ST elevated myocardial infarction) [facility])   Essential hypertension   Hyperlipidemia   Hypothyroidism   Left leg swelling  Acute NSTEMI -Admit to inpatient cardiac progressive unit -Placed on a heparin drip and will continue for at least 48 hours -Check ECHOCardiogram and pending to be done -Cannot use aspirin given her allergy we will continue with nitroglycerin,  statin -Cardiology will be consulted for further evaluation recommendations -Continue monitor and repeat EKG in AM -Check ECHOCardiogram -Troponin level went from 25 -> 416 -> 5859 -> 6728 -We will check magnesium and phosphorus level  -She had elevated fibrin derivatives D-dimer of 2240.71 and CTA PE done but Left Leg more swollen than Right so will get Venous Duplex -CTA PE done and showed "Negative examination for pulmonary embolism.  Coronary artery disease. Aortic Atherosclerosis (ICD10-I70.0).  New, although age indeterminate high-grade wedge deformity of the L1 vertebral body, partially imaged. Correlate for acute pain and point tenderness." -CXR showed "Normal cardiac  and mediastinal contours. Tortuosity of the thoracic aorta. No large area pulmonary consolidation. No pleural effusion or pneumothorax. Emphysematous changes. Redemonstrated T12 compression deformity. -Check  FLP and is below, hemoglobin A1c was 5.8 and TSH was 0.294 -Of Not SARS-CoV-2 Testing pending  -Allergy consulted and discussed option about PCI as well as medical management and she is elected for medical management currently -Continue the heparin drip -With beta-blocker, ACE, statin and nitroglycerin -Further care per cardiology and will follow up on echocardiogram  Hypertension -C/w Lisinopril 10 mg po Daily -Cardiology adding Metoprolol 12.5 mg po BID -Continue to Monitor BP per Protocol  -BP in the ED was Elevated   Hyperlipidemia -Check lipid panel in the a.m and showed Total Cholesterol/HDL Ratio of 5.0, cholesterol level of 271, HDL level of 54, LDL level of 197, triglycerides 101, and VLDL 20 -Increase rosuvastatin dosing to 40 mg p.o. daily -Takes Krill Oil 300 mg po Daily   GERD -C/w PPI with Pantoprazole 40 mg po Daily and with Famotidine 20 mg po BID  Hypothyroidism -Check TSH and was 0.294 and will check a Free T4 -C/w Levothyroxine 100 mcg po Daily   Hyperglycemia -Check Hemoglobin A1c and was 5.8 -Blood sugar on admission was 217 -Continue to monitor blood sugars carefully and if necessary will place on sensitive NovoLog/scale insulin AC  L1 Wedge Defortmity in the setting of her back surgery -C/w Acteaminopehn   Bunions -Continue with Augmentin and will hold her prednisone for now  Left Leg Swelling > Right Leg -Check LE Duplex to r/o DVT and showed no evidence of DVT bilaterally -C/w Heparin gtt as above   GOC: DNR, pOA  DVT prophylaxis: Anticoagulated with a heparin drip Code Status: DO NOT RESUSCITATE Family Communication: No family currently at bedside Disposition Plan: Pending further clinical evaluation by cardiology and parents.  Will need PT OT to further evaluate and treat  Consultants:   Cardiology   Procedures:  LE VENOUS DUPLEX  ECHOCARDIOGRAM    Antimicrobials: Anti-infectives (From admission, onward)   Start      Dose/Rate Route Frequency Ordered Stop   10/25/19 2245  amoxicillin-clavulanate (AUGMENTIN) 875-125 MG per tablet 1 tablet     1 tablet Oral 2 times daily 10/25/19 2231       Subjective: Seen and examined at bedside and she had some chest pain this morning.  She states she is not feeling as well and states that she is very fatigued.  Denies any nausea or vomiting.  States her chest pain is resolved today.  States it happened around 7 AM this morning when she was using the bedside commode.  No other concerns or complaints at this time.  Objective: Vitals:   10/26/19 0225 10/26/19 0300 10/26/19 0804 10/26/19 1135  BP: 123/66 111/71 114/61 (!) 110/54  Pulse: (!) 58 69 64 63  Resp: 18  18 18   Temp: 97.7 F (36.5 C) 97.6 F (36.4 C) 97.9 F (36.6 C) 97.9 F (36.6 C)  TempSrc: Oral Oral Oral Oral  SpO2: 100% 97% 97% 100%  Weight:  55.6 kg    Height:        Intake/Output Summary (Last 24 hours) at 10/26/2019 1145 Last data filed at 10/26/2019 1012 Gross per 24 hour  Intake 231.77 ml  Output 620 ml  Net -388.23 ml   Filed Weights   10/25/19 1401 10/26/19 0300  Weight: 54.9 kg 55.6 kg   Examination: Physical Exam:  Constitutional: Thin elderly Caucasian female currently in no acute distress  appears slightly anxious Eyes: Lids and conjunctivae normal, sclerae anicteric  ENMT: External Ears, Nose appear normal. Grossly normal hearing. Neck: Appears normal, supple, no cervical masses, normal ROM, no appreciable thyromegaly; no JVD Respiratory: Diminished to auscultation bilaterally, no wheezing, rales, rhonchi or crackles. Normal respiratory effort and patient is not tachypenic. No accessory muscle use.  Unlabored breathing Cardiovascular: RRR, no murmurs / rubs / gallops. S1 and S2 auscultated.  Has 1+ lower extremity edema on the left compared to right Abdomen: Soft, non-tender, non-distended. Bowel sounds positive.  GU: Deferred. Musculoskeletal: No clubbing / cyanosis of  digits/nails. No joint deformity upper and lower extremities.  Skin: No rashes, lesions, ulcers on limited skin evaluation. No induration; Warm and dry.  Neurologic: CN 2-12 grossly intact with no focal deficits. Romberg sign and cerebellar reflexes not assessed.  Psychiatric: Normal judgment and insight. Alert and oriented x 3.  Mildly anxious mood and appropriate affect.   Data Reviewed: I have personally reviewed following labs and imaging studies  CBC: Recent Labs  Lab 10/25/19 1416 10/26/19 0204  WBC 9.4 9.9  NEUTROABS 8.1* 6.1  HGB 12.4 11.1*  HCT 37.6 33.4*  MCV 91.9 91.0  PLT 237 774   Basic Metabolic Panel: Recent Labs  Lab 10/25/19 1416 10/26/19 0204  NA 138 140  K 4.2 4.1  CL 103 104  CO2 24 29  GLUCOSE 217* 97  BUN 27* 22  CREATININE 0.85 0.78  CALCIUM 9.3 9.0  MG 1.9 1.9  PHOS 2.3* 3.1   GFR: Estimated Creatinine Clearance: 38.7 mL/min (by C-G formula based on SCr of 0.78 mg/dL). Liver Function Tests: Recent Labs  Lab 10/25/19 1416 10/26/19 0204  AST 27 35  ALT 16 15  ALKPHOS 60 47  BILITOT 0.8 0.8  PROT 7.0 6.0*  ALBUMIN 4.1 3.5   Recent Labs  Lab 10/25/19 1416  LIPASE 54*   No results for input(s): AMMONIA in the last 168 hours. Coagulation Profile: Recent Labs  Lab 10/25/19 1416  INR 1.0   Cardiac Enzymes: No results for input(s): CKTOTAL, CKMB, CKMBINDEX, TROPONINI in the last 168 hours. BNP (last 3 results) No results for input(s): PROBNP in the last 8760 hours. HbA1C: Recent Labs    10/26/19 0204  HGBA1C 5.8*   CBG: No results for input(s): GLUCAP in the last 168 hours. Lipid Profile: Recent Labs    10/26/19 0204  CHOL 271*  HDL 54  LDLCALC 197*  TRIG 101  CHOLHDL 5.0   Thyroid Function Tests: Recent Labs    10/26/19 0204  TSH 0.294*   Anemia Panel: No results for input(s): VITAMINB12, FOLATE, FERRITIN, TIBC, IRON, RETICCTPCT in the last 72 hours. Sepsis Labs: No results for input(s): PROCALCITON,  LATICACIDVEN in the last 168 hours.  Recent Results (from the past 240 hour(s))  SARS Coronavirus 2 by RT PCR (hospital order, performed in [facility] hospital lab) Nasopharyngeal Nasopharyngeal Swab     Status: None   Collection Time: 10/25/19  5:37 PM   Specimen: Nasopharyngeal Swab  Result Value Ref Range Status   SARS Coronavirus 2 NEGATIVE NEGATIVE Final    Comment: (NOTE) SARS-CoV-2 target nucleic acids are NOT DETECTED. The SARS-CoV-2 RNA is generally detectable in upper and lower respiratory specimens during the acute phase of infection. The lowest concentration of SARS-CoV-2 viral copies this assay can detect is 250 copies / mL. A negative result does not preclude SARS-CoV-2 infection and should not be used as the sole basis for treatment or other patient management  decisions.  A negative result may occur with improper specimen collection / handling, submission of specimen other than nasopharyngeal swab, presence of viral mutation(s) within the areas targeted by this assay, and inadequate number of viral copies (<250 copies / mL). A negative result must be combined with clinical observations, patient history, and epidemiological information. Fact Sheet for Patients:   StrictlyIdeas.no Fact Sheet for Healthcare Providers: BankingDealers.co.za This test is not yet approved or cleared  by the [place] FDA and has been authorized for detection and/or diagnosis of SARS-CoV-2 by FDA under an Emergency Use Authorization (EUA).  This EUA will remain in effect (meaning this test can be used) for the duration of the COVID-19 declaration under Section 564(b)(1) of the Act, 21 U.S.C. section 360bbb-3(b)(1), unless the authorization is terminated or revoked sooner. Performed at [facility], [address] [place], [place] [address]   MRSA PCR Screening     Status: None   Collection Time: 10/25/19 11:07 PM   Specimen:  Nasopharyngeal  Result Value Ref Range Status   MRSA by PCR NEGATIVE NEGATIVE Final    Comment:        The GeneXpert MRSA Assay (FDA approved for NASAL specimens only), is one component of a comprehensive MRSA colonization surveillance program. It is not intended to diagnose MRSA infection nor to guide or monitor treatment for MRSA infections. Performed at [facility], [address] [place], [place] [address]      RN Pressure Injury Documentation:     Estimated body mass index is 21.72 kg/m as calculated from the following:   Height as of this encounter: 5\' 3"  (1.6 m).   Weight as of this encounter: 55.6 kg.  Malnutrition Type:      Malnutrition Characteristics:      Nutrition Interventions:    Radiology Studies: DG Chest 2 View  Result Date: 10/25/2019 CLINICAL DATA:  Patient with chest pain. EXAM: CHEST - 2 VIEW COMPARISON:  Chest radiograph 09/10/2016. FINDINGS: Normal cardiac and mediastinal contours. Tortuosity of the thoracic aorta. No large area pulmonary consolidation. No pleural effusion or pneumothorax. Emphysematous changes. Redemonstrated T12 compression deformity. IMPRESSION: No acute cardiopulmonary process. Electronically Signed   By: Lovey Newcomer M.D.   On: 10/25/2019 15:18   CT Angio Chest PE W and/or Wo Contrast  Result Date: 10/25/2019 CLINICAL DATA:  Chest pain, PE suspected EXAM: CT ANGIOGRAPHY CHEST WITH CONTRAST TECHNIQUE: Multidetector CT imaging of the chest was performed using the standard protocol during bolus administration of intravenous contrast. Multiplanar CT image reconstructions and MIPs were obtained to evaluate the vascular anatomy. CONTRAST:  58mL OMNIPAQUE IOHEXOL 350 MG/ML SOLN COMPARISON:  09/10/2016 FINDINGS: Cardiovascular: Satisfactory opacification of the pulmonary arteries to the segmental level. No evidence of pulmonary embolism. Normal heart size. Extensive 3 vessel coronary artery calcifications and/or stents. No  pericardial effusion. Severe mixed aortic atherosclerosis. Mediastinum/Nodes: No enlarged mediastinal, hilar, or axillary lymph nodes. Thyroid gland, trachea, and esophagus demonstrate no significant findings. Lungs/Pleura: Lungs are clear. No pleural effusion or pneumothorax. Upper Abdomen: No acute abnormality. Musculoskeletal: No chest wall abnormality. There is a new, although age indeterminate high-grade wedge deformity of the L1 vertebral body, partially imaged. Review of the MIP images confirms the above findings. IMPRESSION: 1. Negative examination for pulmonary embolism. 2. Coronary artery disease. Aortic Atherosclerosis (ICD10-I70.0). 3. New, although age indeterminate high-grade wedge deformity of the L1 vertebral body, partially imaged. Correlate for acute pain and point tenderness. Electronically Signed   By: Dorna Bloom.D.  On: 10/25/2019 17:05   US Venous Img Lower Bilateral (DVT)  Result Date: 10/25/2019 CLINICAL DATA:  86 year old with elevated D-dimer and leg swelling. EXAM: BILATERAL LOWER EXTREMITY VENOUS DOPPLER ULTRASOUND TECHNIQUE: Gray-scale sonography with compression, as well as color and duplex ultrasound, were performed to evaluate the deep venous system(s) from the level of the common femoral vein through the popliteal and proximal calf veins. COMPARISON:  02/12/2013 bilateral lower extremity duplex. FINDINGS: VENOUS Normal compressibility of the common femoral, superficial femoral, and popliteal veins, as well as the visualized calf veins. Visualized portions of profunda femoral vein and great saphenous vein unremarkable. No filling defects to suggest DVT on grayscale or color Doppler imaging. Doppler waveforms show normal direction of venous flow, normal respiratory plasticity and response to augmentation. OTHER None. Limitations: none IMPRESSION: No evidence of bilateral lower extremity DVT. Electronically Signed   By: Keith Rake M.D.   On: 10/25/2019 20:59   Scheduled  Meds:  alum & mag hydroxide-simeth  30 mL Oral Once   amoxicillin-clavulanate  1 tablet Oral BID   DULoxetine  30 mg Oral Daily   levothyroxine  100 mcg Oral Daily   metoprolol tartrate  25 mg Oral BID   multivitamin-lutein  1 capsule Oral Daily   pantoprazole  40 mg Oral Daily   Continuous Infusions:  heparin 650 Units/hr (10/25/19 2208)    LOS: 1 day   Kerney Elbe, DO [facility] PAGER is on [place]  If 7PM-7AM, please contact night-coverage [email or web address]

## 2019-10-26 NOTE — Progress Notes (Signed)
Pt troponin trended up from 25, 416, 5,580,8/8*, to 6278. Pt has no complaints of chest pain. Notify NP morrison. Will continue to monitor.

## 2019-10-26 NOTE — Progress Notes (Signed)
PT Cancellation Note  Patient Details Name: Michele [name] MRN: [id number] DOB: 08/05/1927   Cancelled Treatment:    Reason Eval/Treat Not Completed: Other (comment).  PT consult received.  Chart reviewed.  Nurse cleared therapy to work with pt (no chest pain noted last time pt transferred to [facility] with nursing staff).  OT currently working with pt (pt not available for PT session).  Will re-attempt PT evaluation tomorrow.  Leitha Bleak, PT 10/26/19, 4:18 PM

## 2019-10-26 NOTE — Progress Notes (Signed)
Progress Note  Patient Name: Michele [name] Date of Encounter: 10/26/2019  [facility] Cardiologist: No primary care provider on file. new ( Michele [name])  Subjective   She had an episode of chest pain this morning when she was trying to use the bedside commode.  The episode lasted for about 1 hour but was not as intense and has the chest pain yesterday.  Inpatient Medications    Scheduled Meds: . alum & mag hydroxide-simeth  30 mL Oral Once  . amoxicillin-clavulanate  1 tablet Oral BID  . atorvastatin  40 mg Oral QPM  . clopidogrel  75 mg Oral Daily  . DULoxetine  30 mg Oral Daily  . levothyroxine  100 mcg Oral Daily  . metoprolol tartrate  25 mg Oral BID  . multivitamin-lutein  1 capsule Oral Daily  . pantoprazole  40 mg Oral Daily   Continuous Infusions: . heparin 650 Units/hr (10/25/19 2208)   PRN Meds: acetaminophen, nitroGLYCERIN, ondansetron (ZOFRAN) IV   Vital Signs    Vitals:   10/26/19 0225 10/26/19 0300 10/26/19 0804 10/26/19 1135  BP: 123/66 111/71 114/61 (!) 110/54  Pulse: (!) 58 69 64 63  Resp: 18  18 18   Temp: 97.7 F (36.5 C) 97.6 F (36.4 C) 97.9 F (36.6 C) 97.9 F (36.6 C)  TempSrc: Oral Oral Oral Oral  SpO2: 100% 97% 97% 100%  Weight:  55.6 kg    Height:        Intake/Output Summary (Last 24 hours) at 10/26/2019 1517 Last data filed at 10/26/2019 1354 Gross per 24 hour  Intake 497.77 ml  Output 1720 ml  Net -1222.23 ml   Last 3 Weights 10/26/2019 10/25/2019 12/27/2018  Weight (lbs) 122 lb 9.6 oz 121 lb 132 lb  Weight (kg) 55.611 kg 54.885 kg 59.875 kg      Telemetry    Normal sinus rhythm with first-degree AV block- Personally Reviewed  ECG     - Personally Reviewed  Physical Exam   GEN: No acute distress.   Neck: No JVD Cardiac: RRR, no  rubs, or gallops.  2 out of 6 systolic murmur in the aortic area Respiratory: Clear to auscultation bilaterally. GI: Soft, nontender, non-distended  MS: No edema; No deformity. Neuro:   Nonfocal  Psych: Normal affect   Labs    High Sensitivity Troponin:   Recent Labs  Lab 10/25/19 1416 10/25/19 1626 10/25/19 2157 10/25/19 2253 10/26/19 0030  TROPONINIHS 25* 416* 5,580* 5,859* 6,728*      Chemistry Recent Labs  Lab 10/25/19 1416 10/26/19 0204  NA 138 140  K 4.2 4.1  CL 103 104  CO2 24 29  GLUCOSE 217* 97  BUN 27* 22  CREATININE 0.85 0.78  CALCIUM 9.3 9.0  PROT 7.0 6.0*  ALBUMIN 4.1 3.5  AST 27 35  ALT 16 15  ALKPHOS 60 47  BILITOT 0.8 0.8  GFRNONAA >60 >60  GFRAA >60 >60  ANIONGAP 11 7     Hematology Recent Labs  Lab 10/25/19 1416 10/26/19 0204  WBC 9.4 9.9  RBC 4.09 3.67*  HGB 12.4 11.1*  HCT 37.6 33.4*  MCV 91.9 91.0  MCH 30.3 30.2  MCHC 33.0 33.2  RDW 13.2 13.2  PLT 237 225    BNPNo results for input(s): BNP, PROBNP in the last 168 hours.   DDimer No results for input(s): DDIMER in the last 168 hours.   Radiology    DG Chest 2 View  Result Date: 10/25/2019 CLINICAL DATA:  Patient with chest pain. EXAM: CHEST - 2 VIEW COMPARISON:  Chest radiograph 09/10/2016. FINDINGS: Normal cardiac and mediastinal contours. Tortuosity of the thoracic aorta. No large area pulmonary consolidation. No pleural effusion or pneumothorax. Emphysematous changes. Redemonstrated T12 compression deformity. IMPRESSION: No acute cardiopulmonary process. Electronically Signed   By: Lovey Newcomer M.D.   On: 10/25/2019 15:18   CT Angio Chest PE W and/or Wo Contrast  Result Date: 10/25/2019 CLINICAL DATA:  Chest pain, PE suspected EXAM: CT ANGIOGRAPHY CHEST WITH CONTRAST TECHNIQUE: Multidetector CT imaging of the chest was performed using the standard protocol during bolus administration of intravenous contrast. Multiplanar CT image reconstructions and MIPs were obtained to evaluate the vascular anatomy. CONTRAST:  72mL OMNIPAQUE IOHEXOL 350 MG/ML SOLN COMPARISON:  09/10/2016 FINDINGS: Cardiovascular: Satisfactory opacification of the pulmonary arteries to the  segmental level. No evidence of pulmonary embolism. Normal heart size. Extensive 3 vessel coronary artery calcifications and/or stents. No pericardial effusion. Severe mixed aortic atherosclerosis. Mediastinum/Nodes: No enlarged mediastinal, hilar, or axillary lymph nodes. Thyroid gland, trachea, and esophagus demonstrate no significant findings. Lungs/Pleura: Lungs are clear. No pleural effusion or pneumothorax. Upper Abdomen: No acute abnormality. Musculoskeletal: No chest wall abnormality. There is a new, although age indeterminate high-grade wedge deformity of the L1 vertebral body, partially imaged. Review of the MIP images confirms the above findings. IMPRESSION: 1. Negative examination for pulmonary embolism. 2. Coronary artery disease. Aortic Atherosclerosis (ICD10-I70.0). 3. New, although age indeterminate high-grade wedge deformity of the L1 vertebral body, partially imaged. Correlate for acute pain and point tenderness. Electronically Signed   By: Eddie Candle M.D.   On: 10/25/2019 17:05   US Venous Img Lower Bilateral (DVT)  Result Date: 10/25/2019 CLINICAL DATA:  86 year old with elevated D-dimer and leg swelling. EXAM: BILATERAL LOWER EXTREMITY VENOUS DOPPLER ULTRASOUND TECHNIQUE: Gray-scale sonography with compression, as well as color and duplex ultrasound, were performed to evaluate the deep venous system(s) from the level of the [name] femoral vein through the popliteal and proximal calf veins. COMPARISON:  02/12/2013 bilateral lower extremity duplex. FINDINGS: VENOUS Normal compressibility of the [name] femoral, superficial femoral, and popliteal veins, as well as the visualized calf veins. Visualized portions of profunda femoral vein and great saphenous vein unremarkable. No filling defects to suggest DVT on grayscale or color Doppler imaging. Doppler waveforms show normal direction of venous flow, normal respiratory plasticity and response to augmentation. OTHER None. Limitations: none  IMPRESSION: No evidence of bilateral lower extremity DVT. Electronically Signed   By: Keith Rake M.D.   On: 10/25/2019 20:59    Cardiac Studies   Echocardiogram preliminary images reviewed and showed normal LV systolic function.  Patient Profile     86 y.o. female   with a hx of mild carotid artery disease who presented with non-ST elevation myocardial infarction.     Assessment & Plan    1.  Non-ST elevation myocardial infarction: Troponin continues to increase and most recently was 6700.  Continue unfractionated heparin drip until tomorrow to finish 48 hours of treatment. She reports allergy to aspirin when she was very young and has not tried taking aspirin since then.  I elected to start her on clopidogrel 75 mg once daily. I again discussed different management options of myocardial infarction including invasive evaluation versus conservative medical therapy.  The patient clearly prefers avoiding invasive procedures.  Given her age, I think that is reasonable.  However, if she continues to have recurrent chest pain, we might have to revisit discussing the options with her.  Blood pressure is on the low side and thus I am hesitant to add long-acting nitroglycerin yet. Continue metoprolol.  2.  Hyperlipidemia: LDL was close to 200.  I added atorvastatin 40 mg daily.     For questions or updates, please contact [place] Please consult [email or web address] for contact info under        Signed, Kathlyn Sacramento, MD  10/26/2019, 3:17 PM

## 2019-10-26 NOTE — Evaluation (Signed)
Occupational Therapy Evaluation Patient Details Name: Michele [name] MRN: [id number] DOB: 28-Nov-1927 Today's Date: 10/26/2019    History of Present Illness 86 y.o. female with medical history significant of arthritis, history of carotid bruit, constipation, diverticulosis, GERD, hypertension, hyperlipidemia, hypothyroidism, history of macular degeneration, osteopenia, urinary frequency as well as other comorbidities who presents with chief complaint of chest pain.  States that she developed substernal Left sided chest pain that radiated into the middle described as sharp and severe 10/10 ntensity.  He had associated symptoms of nausea but no vomiting, shortness of breath but no lightheadedness or dizziness.  States that 2 days prior she woke up with diaphoresis and had some severe pain in her left arm. She was given Nitropaste in the ED and denied any shortness breath, nausea, lightheadedness.  She states the pain started shortly after she ate a meal and she states that she is never had this type of pain in the past before.  She yesterday went to get treated for her bunions. TRH was asked admit this patient for NSTEMI and cardiology will be consulted her EKG did show anterior lateral ST depressions and elevated troponin level.  D-dimer was elevated and so she ended up having a CTA of the chest which showed no evidence of PE. BLE [place] negative for DVT.   Clinical Impression   Pt was seen for OT evaluation this date. Prior to hospital admission, pt was performing bird baths at the sink and using a sock aid to don socks, otherwise able to perform dressing as well as toileting on her own. Pt reports that she is allowed to use her RW in her room on her own but typically spends most of her time in a w/c which she can maneuver independently with both arms and legs. Pt reports if she wants to walk in the hallway, she has to use her RW and have staff with her. Pt reports living in "skilled nursing" level of  care; chart indicating ALF. No family to verify. However, pt indicates that she only receives assist from staff for meds, meals, and cleaning aside from mobility out of her room. Currently pt demonstrates impairments as described below (See OT problem list) which functionally limit her ability to perform ADL/self-care tasks. Pt currently requires Min A for LB ADL tasks and CGA for ADL transfers. HR in 70's throughout session. Denied chest pain throughout. Endorses bilateral calf pain - RN notified and provided pain medication during session. Pt performed toileting with set up and CGA for toilet transfer. Left up in recliner at end of session. Pt would benefit from skilled OT to address noted impairments and functional limitations (see below for any additional details) in order to maximize safety and independence while minimizing falls risk and caregiver burden. Upon hospital discharge, recommend HHOT to maximize pt safety and return to functional independence during meaningful occupations of daily life.     Follow Up Recommendations  Home health OT    Equipment Recommendations  3 in 1 bedside commode    Recommendations for Other Services       Precautions / Restrictions Precautions Precautions: Fall Precaution Comments: monitor vitals Restrictions Weight Bearing Restrictions: No      Mobility Bed Mobility Overal bed mobility: Needs Assistance Bed Mobility: Supine to Sit     Supine to sit: Supervision        Transfers Overall transfer level: Needs assistance Equipment used: Rolling walker (2 wheeled) Transfers: Sit to/from Stand Sit to Stand: Min guard  General transfer comment: good hand placement during transfers, CGA and additional time to complete    Balance Overall balance assessment: Needs assistance Sitting-balance support: No upper extremity supported;Feet supported Sitting balance-Leahy Scale: Fair     Standing balance support: No upper extremity  supported;During functional activity Standing balance-Leahy Scale: Fair Standing balance comment: during toileting hygiene, pt able to stand to perform hygiene with UE and then no UE support on RW and no LOB                           ADL either performed or assessed with clinical judgement   ADL Overall ADL's : Needs assistance/impaired                                       General ADL Comments: Min A for LB ADL tasks, CGA for ADL transfers     Vision Baseline Vision/History: Wears glasses;Macular Degeneration Wears Glasses: At all times Patient Visual Report: No change from baseline Additional Comments: pt endorses very poor vision in L eye 2/2 mac degen     Perception     Praxis      Pertinent Vitals/Pain Pain Assessment: 0-10 Pain Score: 5  Pain Location: bilat calves Pain Descriptors / Indicators: Aching Pain Intervention(s): Limited activity within patient's tolerance;Monitored during session;Repositioned;Patient requesting pain meds-RN notified;RN gave pain meds during session     Hand Dominance Right   Extremity/Trunk Assessment Upper Extremity Assessment Upper Extremity Assessment: Overall WFL for tasks assessed   Lower Extremity Assessment Lower Extremity Assessment: Overall WFL for tasks assessed;Generalized weakness       Communication Communication Communication: HOH   Cognition Arousal/Alertness: Awake/alert Behavior During Therapy: WFL for tasks assessed/performed Overall Cognitive Status: Within Functional Limits for tasks assessed                                 General Comments: follows commands well, alert and oriented x4   General Comments       Exercises     Shoulder Instructions      Home Living Family/patient expects to be discharged to:: (ALF vs SNF?) Living Arrangements: Spouse/significant other                                      Prior Functioning/Environment Level of  Independence: Needs assistance  Gait / Transfers Assistance Needed: Pt primarily indep at w/c level, is able to ambulate with RW in room on her own (per pt's report), and is able to ambulate in the hallway with RW and staff assist for safety ADL's / Homemaking Assistance Needed: Pt reports taking sponge baths at sink, mod indep with dressing using AE for socks, and only getting assist for meds, meals, and cleaning   Comments: Pt endorses 1 fall almost 1 yr ago when pt reports she and her spouse were then moved to a higher level of care        OT Problem List: Decreased strength;Cardiopulmonary status limiting activity;Pain;Decreased activity tolerance;Impaired balance (sitting and/or standing);Decreased knowledge of use of DME or AE      OT Treatment/Interventions: Self-care/ADL training;Therapeutic exercise;Therapeutic activities;DME and/or AE instruction;Patient/family education;Balance training    OT Goals(Current goals can be found in the care plan  section) Acute Rehab OT Goals Patient Stated Goal: "hopefully go back to my husband soon" OT Goal Formulation: With patient Time For Goal Achievement: 11/09/19 Potential to Achieve Goals: Good ADL Goals Pt Will Perform Lower Body Dressing: with adaptive equipment;sit to/from stand;with set-up;with supervision Pt Will Transfer to Toilet: with supervision;with set-up;ambulating(elevated commode, LRAD for amb) Additional ADL Goal #1: Pt will verbalize 2+ falls prevention strategies she can incorporate into daily ADL routine to maximize her safety/indep with ADL and minimize risk of falls.  OT Frequency: Min 1X/week   Barriers to D/C:            Co-evaluation              AM-PAC OT "6 Clicks" Daily Activity     Outcome Measure Help from another person eating meals?: None Help from another person taking care of personal grooming?: None Help from another person toileting, which includes using toliet, bedpan, or urinal?: A  Little Help from another person bathing (including washing, rinsing, drying)?: A Little Help from another person to put on and taking off regular upper body clothing?: None Help from another person to put on and taking off regular lower body clothing?: A Little 6 Click Score: 21   End of Session Equipment Utilized During Treatment: Gait belt;Rolling walker Nurse Communication: Other (comment)(need for chair alarm connector piece for wall plug)  Activity Tolerance: Patient tolerated treatment well Patient left: in chair;with call bell/phone within reach;with chair alarm set  OT Visit Diagnosis: Other abnormalities of gait and mobility (R26.89);History of falling (Z91.81);Muscle weakness (generalized) (M62.81);Pain Pain - Right/Left: Right(both) Pain - part of body: Leg(calves)                Time: 5537-4827 OT Time Calculation (min): 30 min Charges:  OT General Charges $OT Visit: 1 Visit OT Evaluation $OT Eval Moderate Complexity: 1 Mod OT Treatments $Self Care/Home Management : 8-22 mins  Jeni Salles, MPH, MS, OTR/L ascom [phone] 10/26/19, 5:14 PM

## 2019-10-26 NOTE — Plan of Care (Signed)
  Problem: Education: Goal: Knowledge of General Education information will improve Description: Including pain rating scale, medication(s)/side effects and non-pharmacologic comfort measures Outcome: Progressing   Problem: Pain Managment: Goal: General experience of comfort will improve Outcome: Progressing   Problem: Skin Integrity: Goal: Risk for impaired skin integrity will decrease Outcome: Progressing   Problem: Cardiac: Goal: Ability to achieve and maintain adequate cardiovascular perfusion will improve Outcome: Progressing

## 2019-10-26 NOTE — Progress Notes (Signed)
*  PRELIMINARY RESULTS* Echocardiogram 2D Echocardiogram has been performed.  Michele [name] 10/26/2019, 12:29 PM

## 2019-10-26 NOTE — Progress Notes (Signed)
ANTICOAGULATION CONSULT NOTE - Initial Consult  Pharmacy Consult for Heparin  Indication: chest pain/ACS  Allergies  Allergen Reactions  . Aspirin Other (See Comments)    Patient collapsed, was rushed to the hospital and hospitalized for a week.  . Other Nausea And Vomiting and Other (See Comments)    AGENT:  anesthesia  . Scallops [Shellfish Allergy] Nausea And Vomiting    Headaches  . Sulfa Antibiotics Hives  . Docosahexaenoic Acid (Dha) Other (See Comments)    GI upset.  . Codeine Nausea And Vomiting  . Morphine And Related Nausea And Vomiting    Patient Measurements: Height: 5\' 3"  (160 cm) Weight: 54.9 kg (121 lb) IBW/kg (Calculated) : 52.4 Heparin Dosing Weight: 54.9 kg   Vital Signs: Temp: 97.7 F (36.5 C) (06/07 0225) Temp Source: Oral (06/07 0225) BP: 111/71 (06/07 0300) Pulse Rate: 69 (06/07 0300)  Labs: Recent Labs    10/25/19 1416 10/25/19 1626 10/25/19 2157 10/25/19 2253 10/26/19 0030 10/26/19 0204  HGB 12.4  --   --   --   --  11.1*  HCT 37.6  --   --   --   --  33.4*  PLT 237  --   --   --   --  225  APTT 28  --   --   --   --   --   LABPROT 12.3  --   --   --   --   --   INR 1.0  --   --   --   --   --   HEPARINUNFRC  --   --   --   --   --  0.51  CREATININE 0.85  --   --   --   --  0.78  TROPONINIHS 25*   < > 5,580* 5,859* 6,728*  --    < > = values in this interval not displayed.    Estimated Creatinine Clearance: 38.7 mL/min (by C-G formula based on SCr of 0.78 mg/dL).   Medical History: Past Medical History:  Diagnosis Date  . Anemia    yrs ago  . Arthritis   . Back pain   . Carotid bruit    right side more so than left;but per pt not enough to clean out  . Constipation    metamucil bid  . Diverticulosis    right  . GERD (gastroesophageal reflux disease)    related to some meds;but doesn't take any medications  . History of migraine    stopped with menopause  . Hyperlipidemia    takes crestor daily  . Hypertension     takes Lisinopril daily  . Hypothyroidism    takes Synthroid daily  . Internal hemorrhoids   . Joint pain   . Joint swelling   . Macular degeneration    dry  . Nocturia   . Numbness    left foot  . Osteopenia   . Phlebitis    hx of-67yrs ago right leg;takes Plavix daily but on hold for surgery  . PONV (postoperative nausea and vomiting)   . Urinary frequency   . Urinary urgency     Medications:  Medications Prior to Admission  Medication Sig Dispense Refill Last Dose  . amoxicillin-clavulanate (AUGMENTIN) 875-125 MG tablet Take 1 tablet by mouth 2 (two) times daily.   10/25/2019 at 0759  . DULoxetine (CYMBALTA) 30 MG capsule Take 30 mg by mouth daily.   10/25/2019 at 0759  . KLOR-CON M10 10 MEQ  tablet Take 10 mEq by mouth daily.   10/25/2019 at 0759  . Krill Oil 300 MG CAPS Take 300 mg by mouth daily.   10/25/2019 at 0759  . Multiple Vitamins-Minerals (PRESERVISION AREDS 2 PO) Take 1 capsule by mouth 2 (two) times daily.   10/25/2019 at 0759  . pantoprazole (PROTONIX) 40 MG tablet Take 40 mg by mouth daily.   10/25/2019 at [place]  . predniSONE (DELTASONE) 50 MG tablet Take 50 mg by mouth daily.   10/25/2019 at 0759  . SYNTHROID 100 MCG tablet Take 100 mcg by mouth daily.   10/25/2019 at [place]  . acetaminophen (TYLENOL) 500 MG tablet Take 500 mg by mouth every 4 (four) hours as needed for mild pain or fever.   10/23/2019 at 2145  . diclofenac Sodium (VOLTAREN) 1 % GEL Apply 2-4 g topically in the morning, at noon, in the evening, and at bedtime.   Not Taking at Unknown time  . famotidine (PEPCID) 20 MG tablet Take 20 mg by mouth 2 (two) times daily.   unknown at prn  . lisinopril (ZESTRIL) 10 MG tablet Take 10 mg by mouth daily.   Not Taking at Unknown time  . rosuvastatin (CRESTOR) 10 MG tablet Take 10 mg by mouth at bedtime.    Not Taking at Unknown time    Assessment: Pharmacy consulted to dose heparin in this 86 year old female admitted with ACS/NSTEMI.  No prior anticoag noted. CrCl = 36.4  ml/min  Goal of Therapy:  Heparin level 0.3-0.7 units/ml Monitor platelets by anticoagulation protocol: Yes   Plan:  06/07 @ 0200 HL 0.51 therapeutic. Will continue current rate and will recheck HL at 1000, CBC trended down slightly but stable, will continue to monitor.  Tobie Lords, PharmD, BCPS Clinical Pharmacist 10/26/2019,3:24 AM

## 2019-10-26 NOTE — Progress Notes (Signed)
Pt was admitted in the floor with no signs of distress. Pt alert x 4. VSS. Ascom within pt reach and pt was educated about safety. Will continue to monitor.

## 2019-10-27 ENCOUNTER — Encounter: Payer: Self-pay | Admitting: Internal Medicine

## 2019-10-27 DIAGNOSIS — H3530 Unspecified macular degeneration: Secondary | ICD-10-CM | POA: Diagnosis present

## 2019-10-27 LAB — COMPREHENSIVE METABOLIC PANEL
ALT: 16 U/L (ref 0–44)
AST: 30 U/L (ref 15–41)
Albumin: 3.7 g/dL (ref 3.5–5.0)
Alkaline Phosphatase: 48 U/L (ref 38–126)
Anion gap: 8 (ref 5–15)
BUN: 21 mg/dL (ref 8–23)
CO2: 30 mmol/L (ref 22–32)
Calcium: 9.1 mg/dL (ref 8.9–10.3)
Chloride: 102 mmol/L (ref 98–111)
Creatinine, Ser: 0.76 mg/dL (ref 0.44–1.00)
GFR calc Af Amer: >60 mL/min (ref >60)
GFR calc non Af Amer: >60 mL/min (ref >60)
Glucose, Bld: 82 mg/dL (ref 70–99)
Potassium: 4.3 mmol/L (ref 3.5–5.1)
Sodium: 140 mmol/L (ref 135–145)
Total Bilirubin: 0.7 mg/dL (ref 0.3–1.2)
Total Protein: 6.4 g/dL — ABNORMAL LOW (ref 6.5–8.1)

## 2019-10-27 LAB — CBC WITH DIFFERENTIAL/PLATELET
Abs Immature Granulocytes: 0.02 10*3/uL (ref 0.00–0.07)
Basophils Absolute: 0.1 10*3/uL (ref 0.0–0.1)
Basophils Relative: 1 %
Eosinophils Absolute: 0.1 10*3/uL (ref 0.0–0.5)
Eosinophils Relative: 2 %
HCT: 37.9 % (ref 36.0–46.0)
Hemoglobin: 12.3 g/dL (ref 12.0–15.0)
Immature Granulocytes: 0 %
Lymphocytes Relative: 47 %
Lymphs Abs: 3.5 10*3/uL (ref 0.7–4.0)
MCH: 30.3 pg (ref 26.0–34.0)
MCHC: 32.5 g/dL (ref 30.0–36.0)
MCV: 93.3 fL (ref 80.0–100.0)
Monocytes Absolute: 0.6 10*3/uL (ref 0.1–1.0)
Monocytes Relative: 8 %
Neutro Abs: 3.1 10*3/uL (ref 1.7–7.7)
Neutrophils Relative %: 42 %
Platelets: 238 10*3/uL (ref 150–400)
RBC: 4.06 MIL/uL (ref 3.87–5.11)
RDW: 13.1 % (ref 11.5–15.5)
WBC: 7.5 10*3/uL (ref 4.0–10.5)
nRBC: 0 % (ref 0.0–0.2)

## 2019-10-27 LAB — PHOSPHORUS: Phosphorus: 3.3 mg/dL (ref 2.5–4.6)

## 2019-10-27 LAB — HEPARIN LEVEL (UNFRACTIONATED): Heparin Unfractionated: 0.63 IU/mL (ref 0.30–0.70)

## 2019-10-27 LAB — MAGNESIUM: Magnesium: 2.1 mg/dL (ref 1.7–2.4)

## 2019-10-27 MED ORDER — RANOLAZINE ER 500 MG PO TB12
500.0000 mg | ORAL_TABLET | Freq: Two times a day (BID) | ORAL | Status: DC
  Administered 2019-10-27 – 2019-10-28 (×3): 500 mg via ORAL
  Filled 2019-10-27 (×4): qty 1

## 2019-10-27 MED ORDER — HEPARIN (PORCINE) 25000 UT/250ML-% IV SOLN
650.0000 [IU]/h | INTRAVENOUS | Status: AC
  Administered 2019-10-27: 650 [IU]/h via INTRAVENOUS

## 2019-10-27 NOTE — Progress Notes (Signed)
ANTICOAGULATION CONSULT NOTE -  Pharmacy Consult for Heparin  Indication: chest pain/ACS  Allergies  Allergen Reactions  . Aspirin Other (See Comments)    Patient collapsed, was rushed to the hospital and hospitalized for a week.  . Other Nausea And Vomiting and Other (See Comments)    AGENT:  anesthesia  . Scallops [Shellfish Allergy] Nausea And Vomiting    Headaches  . Sulfa Antibiotics Hives  . Docosahexaenoic Acid (Dha) Other (See Comments)    GI upset.  . Codeine Nausea And Vomiting  . Morphine And Related Nausea And Vomiting    Patient Measurements: Height: 5\' 3"  (160 cm) Weight: 54.4 kg (119 lb 14.4 oz) IBW/kg (Calculated) : 52.4 Heparin Dosing Weight: 54.9 kg   Vital Signs: Temp: 97.8 F (36.6 C) (06/08 0426) Temp Source: Oral (06/08 0426) BP: 116/64 (06/08 0426) Pulse Rate: 58 (06/08 0426)  Labs: Recent Labs    10/25/19 1416 10/25/19 1416 10/25/19 1626 10/25/19 2157 10/25/19 2253 10/26/19 0030 10/26/19 0204 10/26/19 0954 10/27/19 0444  HGB 12.4   < >  --   --   --   --  11.1*  --  12.3  HCT 37.6  --   --   --   --   --  33.4*  --  37.9  PLT 237  --   --   --   --   --  225  --  238  APTT 28  --   --   --   --   --   --   --   --   LABPROT 12.3  --   --   --   --   --   --   --   --   INR 1.0  --   --   --   --   --   --   --   --   HEPARINUNFRC  --   --   --   --   --   --  0.51 0.51 0.63  CREATININE 0.85  --   --   --   --   --  0.78  --  0.76  TROPONINIHS 25*  --    < > 5,580* 5,859* 6,728*  --   --   --    < > = values in this interval not displayed.    Estimated Creatinine Clearance: 38.7 mL/min (by C-G formula based on SCr of 0.76 mg/dL).   Medical History: Past Medical History:  Diagnosis Date  . Anemia    yrs ago  . Arthritis   . Back pain   . Carotid bruit    right side more so than left;but per pt not enough to clean out  . Constipation    metamucil bid  . Diverticulosis    right  . GERD (gastroesophageal reflux disease)    related to some meds;but doesn't take any medications  . History of migraine    stopped with menopause  . Hyperlipidemia    takes crestor daily  . Hypertension    takes Lisinopril daily  . Hypothyroidism    takes Synthroid daily  . Internal hemorrhoids   . Joint pain   . Joint swelling   . Macular degeneration    dry  . Nocturia   . Numbness    left foot  . Osteopenia   . Phlebitis    hx of-62yrs ago right leg;takes Plavix daily but on hold for surgery  . PONV (postoperative  nausea and vomiting)   . Urinary frequency   . Urinary urgency     Medications:  Medications Prior to Admission  Medication Sig Dispense Refill Last Dose  . amoxicillin-clavulanate (AUGMENTIN) 875-125 MG tablet Take 1 tablet by mouth 2 (two) times daily.   10/25/2019 at 0759  . DULoxetine (CYMBALTA) 30 MG capsule Take 30 mg by mouth daily.   10/25/2019 at 0759  . KLOR-CON M10 10 MEQ tablet Take 10 mEq by mouth daily.   10/25/2019 at 0759  . Krill Oil 300 MG CAPS Take 300 mg by mouth daily.   10/25/2019 at 0759  . Multiple Vitamins-Minerals (PRESERVISION AREDS 2 PO) Take 1 capsule by mouth 2 (two) times daily.   10/25/2019 at 0759  . pantoprazole (PROTONIX) 40 MG tablet Take 40 mg by mouth daily.   10/25/2019 at [place]  . predniSONE (DELTASONE) 50 MG tablet Take 50 mg by mouth daily.   10/25/2019 at 0759  . SYNTHROID 100 MCG tablet Take 100 mcg by mouth daily.   10/25/2019 at [place]  . acetaminophen (TYLENOL) 500 MG tablet Take 500 mg by mouth every 4 (four) hours as needed for mild pain or fever.   10/23/2019 at 2145  . diclofenac Sodium (VOLTAREN) 1 % GEL Apply 2-4 g topically in the morning, at noon, in the evening, and at bedtime.   Not Taking at Unknown time  . famotidine (PEPCID) 20 MG tablet Take 20 mg by mouth 2 (two) times daily.   unknown at prn  . lisinopril (ZESTRIL) 10 MG tablet Take 10 mg by mouth daily.   Not Taking at Unknown time  . rosuvastatin (CRESTOR) 10 MG tablet Take 10 mg by mouth at bedtime.    Not Taking  at Unknown time    Assessment: Pharmacy consulted to dose heparin in this 86 year old female admitted with ACS/NSTEMI.  No prior anticoag noted. CrCl = 36.4 ml/min  6/7 0204 HL 0.51 6/7 0954 HL 0.51  6/8 0444 HL 0.63  Goal of Therapy:  Heparin level 0.3-0.7 units/ml Monitor platelets by anticoagulation protocol: Yes   Plan:  Heparin level is therapeutic. Will continue current rate and will recheck HL and CBC with AM labs.  CBC stable, will continue to monitor.  Hart Robinsons, PharmD Clinical Pharmacist 10/27/2019,6:11 AM

## 2019-10-27 NOTE — NC FL2 (Signed)
[place] LEVEL OF CARE SCREENING TOOL     IDENTIFICATION  Patient Name: Michele [name] Birthdate: 1927/09/13 Sex: female Admission Date (Current Location): 10/25/2019  [facility] and [place] Number:  Engineering geologist and Address:  [facility], [address] [place], [place] [address]      Provider Number: [id number]  Attending Physician Name and Address:  Cristal Ford, DO  Relative Name and Phone Number:       Current Level of Care: Hospital Recommended Level of Care: [place] Prior Approval Number:    Date Approved/Denied:   PASRR Number:    Discharge Plan: SNF    Current Diagnoses: Patient Active Problem List   Diagnosis Date Noted  . Macular degeneration   . NSTEMI (non-ST elevated myocardial infarction) ([place]) 10/25/2019  . Essential hypertension 10/25/2019  . Hyperlipidemia 10/25/2019  . Hypothyroidism 10/25/2019  . Left leg swelling 10/25/2019  . Chest pain 09/10/2016    Orientation RESPIRATION BLADDER Height & Weight     Self, Time, Situation, Place  Normal Continent Weight: 119 lb 14.4 oz (54.4 kg) Height:  5\' 3"  (160 cm)  BEHAVIORAL SYMPTOMS/MOOD NEUROLOGICAL BOWEL NUTRITION STATUS      Continent Diet(heart healthy, thin liquids)  AMBULATORY STATUS COMMUNICATION OF NEEDS Skin   Limited Assist Verbally Normal                       Personal Care Assistance Level of Assistance  Dressing, Feeding, Bathing Bathing Assistance: Limited assistance Feeding assistance: Independent Dressing Assistance: Limited assistance     Functional Limitations Info  Sight, Hearing, Speech Sight Info: Adequate Hearing Info: Adequate Speech Info: Adequate    SPECIAL CARE FACTORS FREQUENCY  PT (By licensed PT), OT (By licensed OT)     PT Frequency: 5x OT Frequency: 5x            Contractures Contractures Info: Not present    Additional Factors Info  Code Status, Allergies Code  Status Info: DNR Allergies Info: Aspirin, Other, Scallops (Shellfish Allergy), Sulfa Antibiotics, Docosahexaenoic Acid (Dha), Codeine, Morphine And Related           Current Medications (10/27/2019):  This is the current hospital active medication list Current Facility-Administered Medications  Medication Dose Route Frequency Provider Last Rate Last Admin  . acetaminophen (TYLENOL) tablet 650 mg  650 mg Oral Q4H PRN Raiford Noble [place], DO   650 mg at 10/26/19 1625  . alum & mag hydroxide-simeth (MAALOX/MYLANTA) 200-200-20 MG/5ML suspension 30 mL  30 mL Oral Once Raiford Noble Latif, DO      . amoxicillin-clavulanate (AUGMENTIN) 875-125 MG per tablet 1 tablet  1 tablet Oral BID Raiford Noble [place], DO   1 tablet at 10/27/19 6712  . atorvastatin (LIPITOR) tablet 40 mg  40 mg Oral QPM Kathlyn [place] A, MD   40 mg at 10/26/19 1625  . clopidogrel (PLAVIX) tablet 75 mg  75 mg Oral Daily Wellington [place], MD   75 mg at 10/27/19 0828  . DULoxetine (CYMBALTA) DR capsule 30 mg  30 mg Oral Daily Raiford Noble [place], DO   30 mg at 10/27/19 4580  . heparin ADULT infusion 100 units/mL (25000 units/221mL sodium chloride 0.45%)  650 Units/hr Intravenous Continuous Radene Gunning, NP 6.5 mL/hr at 10/27/19 1320 650 Units/hr at 10/27/19 1320  . levothyroxine (SYNTHROID) tablet 100 mcg  100 mcg Oral Daily Raiford Noble [place], DO   100 mcg at 10/27/19 0602  .  metoprolol tartrate (LOPRESSOR) tablet 25 mg  25 mg Oral BID Raiford Noble [place], DO   25 mg at 10/27/19 8138  . multivitamin-lutein (OCUVITE-LUTEIN) capsule 1 capsule  1 capsule Oral Daily Raiford Noble [place], [place]   1 capsule at 10/26/19 8719  . nitroGLYCERIN (NITROSTAT) SL tablet 0.4 mg  0.4 mg Sublingual Q5 min PRN Sheikh, Omair Latif, DO      . ondansetron [facility]) injection 4 mg  4 mg Intravenous Q6H PRN Sheikh, Omair Latif, DO      . pantoprazole (PROTONIX) EC tablet 40 mg  40 mg Oral Daily Raiford Noble [place], DO   40 mg at 10/27/19 5974  . ranolazine  (RANEXA) 12 hr tablet 500 mg  500 mg Oral BID End, Christopher, MD   500 mg at 10/27/19 1320  . sodium chloride flush (NS) 0.9 % injection 10 mL  10 mL Intravenous Q12H Sheikh, Omair Latif, DO   10 mL at 10/27/19 7185     Discharge Medications: Please see discharge summary for a list of discharge medications.  Relevant Imaging Results:  Relevant Lab Results:   Additional Information SSN:[id number]  Gerrianne Scale Nyellie Yetter, LCSW

## 2019-10-27 NOTE — Plan of Care (Signed)
  Problem: Education: Goal: Knowledge of General Education information will improve Description: Including pain rating scale, medication(s)/side effects and non-pharmacologic comfort measures Outcome: Progressing   Problem: Clinical Measurements: Goal: Ability to maintain clinical measurements within normal limits will improve Outcome: Progressing Goal: Respiratory complications will improve Outcome: Progressing   

## 2019-10-27 NOTE — Evaluation (Signed)
Physical Therapy Evaluation Patient Details Name: Michele [name] MRN: [id number] DOB: April 23, 1928 Today's Date: 10/27/2019   History of Present Illness  Pt is a 86 y.o. female presenting to hospital 6/6 with chest pain (substernal and somewhat radiating to L).  Pt admitted with acute NSTEMI, htn, HLD, hyperglycemia, and L>R LE swelling.  Testing (-) for PE and B LE DVT.  PMH includes anemia, htn, macular degeneration, bunions, T12 compression fx, and L1 wedge deformity.  Clinical Impression  Prior to hospital admission, pt was able to ambulate within her room with RW on own (used manual w/c in bathroom and outside of room unless she had staff assist to walk with her in hallway); called for staff assist for bed mobility as needed.  Currently pt is modified independent semi-supine to sitting edge of bed; CGA with transfers using RW; and CGA with ambulating 40 feet with RW.  Pt reporting no chest pain or discomfort during entire session.  Pt would benefit from skilled PT to address noted impairments and functional limitations (see below for any additional details).  Upon hospital discharge, pt would benefit from HHPT to improve overall strength, balance, and functional mobility.    Follow Up Recommendations Home health PT    Equipment Recommendations  (pt has RW and manual w/c already)    Recommendations for Other Services       Precautions / Restrictions Precautions Precautions: Fall Precaution Comments: monitor vitals Restrictions Weight Bearing Restrictions: No      Mobility  Bed Mobility Overal bed mobility: Modified Independent Bed Mobility: Supine to Sit     Supine to sit: Modified independent (Device/Increase time)     General bed mobility comments: mild increased effort/time to perform on own  Transfers Overall transfer level: Needs assistance Equipment used: Rolling walker (2 wheeled) Transfers: Sit to/from [facility] Sit to Stand: Min  guard Stand pivot transfers: Min guard       General transfer comment: sit to/from stand from bed x2 trials and from [facility] x1 trial; stand step turn bed to/from recliner with RW CGA; mild increased effort to stand noted  Ambulation/Gait Ambulation/Gait assistance: Min guard Gait Distance (Feet): 40 Feet Assistive device: Rolling walker (2 wheeled)   Gait velocity: decreased   General Gait Details: L knee mildly flexed compared to R during stance phase; decreased B LE step length; overall steady with RW use  Stairs            Wheelchair Mobility    Modified Rankin (Stroke Patients Only)       Balance Overall balance assessment: Needs assistance Sitting-balance support: No upper extremity supported;Feet supported Sitting balance-Leahy Scale: Good Sitting balance - Comments: steady sitting reaching within BOS   Standing balance support: Bilateral upper extremity supported;During functional activity Standing balance-Leahy Scale: Fair Standing balance comment: steady ambulating with RW; able to perform toilet hygiene with at least single UE support                             Pertinent Vitals/Pain Pain Assessment: No/denies pain Pain Intervention(s): Limited activity within patient's tolerance;Monitored during session;Repositioned  HR 60's to low 70's bpm during sessions activities.    Home Living Family/patient expects to be discharged to:: Assisted living               Home Equipment: Gilford Rile - 2 wheels;Wheelchair - manual      Prior Function Level of Independence: Needs assistance   Gait /  Transfers Assistance Needed: Ambulates with RW in room and has staff assist when walking with walker in hallway; uses manual w/c in bathroom (finds it easier and safer) and outside of room on own.  Pt reports she calls for help getting out of bed to w/c but has recently been able to progress to transferring w/c to bed on own.  ADL's / Homemaking Assistance  Needed: Per OT eval "Pt reports taking sponge baths at sink, mod indep with dressing using AE for socks, and only getting assist for meds, meals, and cleaning"        Hand Dominance   Dominant Hand: Right    Extremity/Trunk Assessment   Upper Extremity Assessment Upper Extremity Assessment: Generalized weakness    Lower Extremity Assessment Lower Extremity Assessment: Generalized weakness    Cervical / Trunk Assessment Cervical / Trunk Assessment: Normal  Communication   Communication: HOH  Cognition Arousal/Alertness: Awake/alert Behavior During Therapy: WFL for tasks assessed/performed Overall Cognitive Status: Within Functional Limits for tasks assessed                                 General Comments: A&O x4; does well following commands      General Comments   Nursing cleared pt for participation in physical therapy.  Pt agreeable to PT session.    Exercises  Ambulation; transfers   Assessment/Plan    PT Assessment Patient needs continued PT services  PT Problem List Decreased strength;Decreased activity tolerance;Decreased balance;Decreased mobility;Decreased knowledge of precautions       PT Treatment Interventions DME instruction;Gait training;Functional mobility training;Therapeutic activities;Therapeutic exercise;Balance training;Patient/family education    PT Goals (Current goals can be found in the Care Plan section)  Acute Rehab PT Goals Patient Stated Goal: to improve mobility PT Goal Formulation: With patient Time For Goal Achievement: 11/10/19 Potential to Achieve Goals: Good    Frequency Min 2X/week   Barriers to discharge        Co-evaluation               AM-PAC PT "6 Clicks" Mobility  Outcome Measure Help needed turning from your back to your side while in a flat bed without using bedrails?: None Help needed moving from lying on your back to sitting on the side of a flat bed without using bedrails?: None Help  needed moving to and from a bed to a chair (including a wheelchair)?: A Little Help needed standing up from a chair using your arms (e.g., wheelchair or bedside chair)?: A Little Help needed to walk in hospital room?: A Little Help needed climbing 3-5 steps with a railing? : A Lot 6 Click Score: 19    End of Session Equipment Utilized During Treatment: Gait belt Activity Tolerance: Patient tolerated treatment well Patient left: in chair;with call bell/phone within reach;with chair alarm set;Other (comment)(B heels floating via pillow support) Nurse Communication: Mobility status;Precautions;Other (comment)(pt had bowel movement and urinated on [facility]) PT Visit Diagnosis: Other abnormalities of gait and mobility (R26.89);Muscle weakness (generalized) (M62.81)    Time: 4287-6811 PT Time Calculation (min) (ACUTE ONLY): 49 min   Charges:   PT Evaluation $PT Eval Low Complexity: 1 Low PT Treatments $Therapeutic Activity: 23-37 mins       Taitum Menton, PT 10/27/19, 11:19 AM

## 2019-10-27 NOTE — Progress Notes (Signed)
Progress Note  Patient Name: Michele [name] Date of Encounter: 10/27/2019  [facility] Cardiologist: Thurston Hole  Subjective   Patient denies chest pain/discomfort.  She feels comfortable at rest but became short of breath walking in her room with physical therapy.  She notes this has been a gradual progression over the last several weeks.  She denies palpitations and lightheadedness.  Inpatient Medications    Scheduled Meds: . alum & mag hydroxide-simeth  30 mL Oral Once  . amoxicillin-clavulanate  1 tablet Oral BID  . atorvastatin  40 mg Oral QPM  . clopidogrel  75 mg Oral Daily  . DULoxetine  30 mg Oral Daily  . levothyroxine  100 mcg Oral Daily  . metoprolol tartrate  25 mg Oral BID  . multivitamin-lutein  1 capsule Oral Daily  . pantoprazole  40 mg Oral Daily  . sodium chloride flush  10 mL Intravenous Q12H   Continuous Infusions: . heparin 650 Units/hr (10/26/19 2256)   PRN Meds: acetaminophen, nitroGLYCERIN, ondansetron (ZOFRAN) IV   Vital Signs    Vitals:   10/26/19 1939 10/27/19 0426 10/27/19 0719 10/27/19 1106  BP: 120/63 116/64 (!) 133/56 (!) 111/52  Pulse: 64 (!) 58 (!) 58 60  Resp: 20 20 17 17   Temp: (!) 97.5 F (36.4 C) 97.8 F (36.6 C) 97.6 F (36.4 C) 97.7 F (36.5 C)  TempSrc: Oral Oral Oral   SpO2: 98% 100% 100% 100%  Weight:  54.4 kg    Height:        Intake/Output Summary (Last 24 hours) at 10/27/2019 1209 Last data filed at 10/27/2019 0945 Gross per 24 hour  Intake 609.63 ml  Output 2325 ml  Net -1715.37 ml   Last 3 Weights 10/27/2019 10/26/2019 10/25/2019  Weight (lbs) 119 lb 14.4 oz 122 lb 9.6 oz 121 lb  Weight (kg) 54.386 kg 55.611 kg 54.885 kg      Telemetry    Sinus rhythm. - Personally Reviewed  ECG    No new tracing.  Physical Exam   GEN: No acute distress.   Neck: No JVD Cardiac: RRR with 2/6 systolic murmur.  No rubs or gallops. Respiratory:  Mildly diminished breath sounds throughout without wheezes or  crackles. GI: Soft, nontender, non-distended  MS: No edema; No deformity. Neuro:  Nonfocal.  Patient asked me 3 times during the encounter who I am and what my name is. Psych: Normal affect   Labs    High Sensitivity Troponin:   Recent Labs  Lab 10/25/19 1416 10/25/19 1626 10/25/19 2157 10/25/19 2253 10/26/19 0030  TROPONINIHS 25* 416* 5,580* 5,859* 6,728*      Chemistry Recent Labs  Lab 10/25/19 1416 10/26/19 0204 10/27/19 0444  NA 138 140 140  K 4.2 4.1 4.3  CL 103 104 102  CO2 24 29 30   GLUCOSE 217* 97 82  BUN 27* 22 21  CREATININE 0.85 0.78 0.76  CALCIUM 9.3 9.0 9.1  PROT 7.0 6.0* 6.4*  ALBUMIN 4.1 3.5 3.7  AST 27 35 30  ALT 16 15 16   ALKPHOS 60 47 48  BILITOT 0.8 0.8 0.7  GFRNONAA >60 >60 >60  GFRAA >60 >60 >60  ANIONGAP 11 7 8      Hematology Recent Labs  Lab 10/25/19 1416 10/26/19 0204 10/27/19 0444  WBC 9.4 9.9 7.5  RBC 4.09 3.67* 4.06  HGB 12.4 11.1* 12.3  HCT 37.6 33.4* 37.9  MCV 91.9 91.0 93.3  MCH 30.3 30.2 30.3  MCHC 33.0 33.2 32.5  RDW 13.2 13.2 13.1  PLT 237 225 238    BNPNo results for input(s): BNP, PROBNP in the last 168 hours.   DDimer No results for input(s): DDIMER in the last 168 hours.   Radiology    DG Chest 2 View  Result Date: 10/25/2019 CLINICAL DATA:  Patient with chest pain. EXAM: CHEST - 2 VIEW COMPARISON:  Chest radiograph 09/10/2016. FINDINGS: Normal cardiac and mediastinal contours. Tortuosity of the thoracic aorta. No large area pulmonary consolidation. No pleural effusion or pneumothorax. Emphysematous changes. Redemonstrated T12 compression deformity. IMPRESSION: No acute cardiopulmonary process. Electronically Signed   By: Lovey Newcomer M.D.   On: 10/25/2019 15:18   CT Angio Chest PE W and/or Wo Contrast  Result Date: 10/25/2019 CLINICAL DATA:  Chest pain, PE suspected EXAM: CT ANGIOGRAPHY CHEST WITH CONTRAST TECHNIQUE: Multidetector CT imaging of the chest was performed using the standard protocol during bolus  administration of intravenous contrast. Multiplanar CT image reconstructions and MIPs were obtained to evaluate the vascular anatomy. CONTRAST:  75mL OMNIPAQUE IOHEXOL 350 MG/ML SOLN COMPARISON:  09/10/2016 FINDINGS: Cardiovascular: Satisfactory opacification of the pulmonary arteries to the segmental level. No evidence of pulmonary embolism. Normal heart size. Extensive 3 vessel coronary artery calcifications and/or stents. No pericardial effusion. Severe mixed aortic atherosclerosis. Mediastinum/Nodes: No enlarged mediastinal, hilar, or axillary lymph nodes. Thyroid gland, trachea, and esophagus demonstrate no significant findings. Lungs/Pleura: Lungs are clear. No pleural effusion or pneumothorax. Upper Abdomen: No acute abnormality. Musculoskeletal: No chest wall abnormality. There is a new, although age indeterminate high-grade wedge deformity of the L1 vertebral body, partially imaged. Review of the MIP images confirms the above findings. IMPRESSION: 1. Negative examination for pulmonary embolism. 2. Coronary artery disease. Aortic Atherosclerosis (ICD10-I70.0). 3. New, although age indeterminate high-grade wedge deformity of the L1 vertebral body, partially imaged. Correlate for acute pain and point tenderness. Electronically Signed   By: Eddie Candle M.D.   On: 10/25/2019 17:05   US Venous Img Lower Bilateral (DVT)  Result Date: 10/25/2019 CLINICAL DATA:  86 year old with elevated D-dimer and leg swelling. EXAM: BILATERAL LOWER EXTREMITY VENOUS DOPPLER ULTRASOUND TECHNIQUE: Gray-scale sonography with compression, as well as color and duplex ultrasound, were performed to evaluate the deep venous system(s) from the level of the common femoral vein through the popliteal and proximal calf veins. COMPARISON:  02/12/2013 bilateral lower extremity duplex. FINDINGS: VENOUS Normal compressibility of the common femoral, superficial femoral, and popliteal veins, as well as the visualized calf veins. Visualized  portions of profunda femoral vein and great saphenous vein unremarkable. No filling defects to suggest DVT on grayscale or color Doppler imaging. Doppler waveforms show normal direction of venous flow, normal respiratory plasticity and response to augmentation. OTHER None. Limitations: none IMPRESSION: No evidence of bilateral lower extremity DVT. Electronically Signed   By: Keith Rake M.D.   On: 10/25/2019 20:59   ECHOCARDIOGRAM COMPLETE  Result Date: 10/26/2019    ECHOCARDIOGRAM REPORT   Patient Name:   NOEMIE DEVIVO [facility] Date of Exam: 10/26/2019 Medical Rec #:  [id number]                Height:       63.0 in Accession #:    [id number]               Weight:       122.6 lb Date of Birth:  [birth year]                BSA:  1.571 m Patient Age:    7 years                 BP:           110/54 mmHg Patient Gender: F                        HR:           62 bpm. Exam Location:  [facility] Procedure: 2D Echo, Cardiac Doppler and Color Doppler Indications:     I21.4 NSTEMI  History:         Patient has prior history of Echocardiogram examinations. Risk                  Factors:Hypertension and Dyslipidemia.  Sonographer:     Charmayne Sheer RDCS (AE) Referring Phys:  [id number] Georgina Quint LATIF [facility] Diagnosing Phys: Kathlyn Sacramento MD IMPRESSIONS  1. Left ventricular ejection fraction, by estimation, is 60 to 65%. The left ventricle has normal function. The left ventricle has no regional wall motion abnormalities. There is mild left ventricular hypertrophy. Left ventricular diastolic parameters are indeterminate.  2. Right ventricular systolic function is normal. The right ventricular size is normal. There is normal pulmonary artery systolic pressure.  3. The mitral valve is abnormal. Mild mitral valve regurgitation. Mild mitral stenosis.  4. The aortic valve is normal in structure. Aortic valve regurgitation is not visualized. Mild to moderate aortic valve sclerosis/calcification is present, without any evidence of  aortic stenosis.  5. The inferior vena cava is normal in size with greater than 50% respiratory variability, suggesting right atrial pressure of 3 mmHg. FINDINGS  Left Ventricle: Left ventricular ejection fraction, by estimation, is 60 to 65%. The left ventricle has normal function. The left ventricle has no regional wall motion abnormalities. The left ventricular internal cavity size was normal in size. There is  mild left ventricular hypertrophy. Left ventricular diastolic parameters are indeterminate. Right Ventricle: The right ventricular size is normal. No increase in right ventricular wall thickness. Right ventricular systolic function is normal. There is normal pulmonary artery systolic pressure. The tricuspid regurgitant velocity is 2.59 m/s, and  with an assumed right atrial pressure of 3 mmHg, the estimated right ventricular systolic pressure is 50.5 mmHg. Left Atrium: Left atrial size was normal in size. Right Atrium: Right atrial size was normal in size. Pericardium: There is no evidence of pericardial effusion. Mitral Valve: The mitral valve is abnormal. There is severe thickening of the mitral valve leaflet(s). There is severe calcification of the mitral valve leaflet(s). Normal mobility of the mitral valve leaflets. Severe mitral annular calcification. Mild mitral valve regurgitation. Mild mitral valve stenosis. MV peak gradient, 11.4 mmHg. The mean mitral valve gradient is 5.0 mmHg. Tricuspid Valve: The tricuspid valve is normal in structure. Tricuspid valve regurgitation is mild . No evidence of tricuspid stenosis. Aortic Valve: The aortic valve is normal in structure. Aortic valve regurgitation is not visualized. Mild to moderate aortic valve sclerosis/calcification is present, without any evidence of aortic stenosis. Aortic valve mean gradient measures 4.0 mmHg. Aortic valve peak gradient measures 6.4 mmHg. Aortic valve area, by VTI measures 2.50 cm. Pulmonic Valve: The pulmonic valve was normal  in structure. Pulmonic valve regurgitation is not visualized. No evidence of pulmonic stenosis. Aorta: The aortic root is normal in size and structure. Venous: The inferior vena cava is normal in size with greater than 50% respiratory variability, suggesting right atrial pressure of 3 mmHg. IAS/Shunts: No  atrial level shunt detected by color flow Doppler.  LEFT VENTRICLE PLAX 2D LVIDd:         3.38 cm  Diastology LVIDs:         2.64 cm  LV e' lateral:   5.33 cm/s LV PW:         0.87 cm  LV E/e' lateral: 30.4 LV IVS:        0.85 cm  LV e' medial:    5.87 cm/s LVOT diam:     2.10 cm  LV E/e' medial:  27.6 LV SV:         65 LV SV Index:   41 LVOT Area:     3.46 cm  LEFT ATRIUM             Index LA diam:        3.70 cm 2.36 cm/m LA Vol (A2C):   45.2 ml 28.78 ml/m LA Vol (A4C):   49.4 ml 31.45 ml/m LA Biplane Vol: 48.0 ml 30.56 ml/m  AORTIC VALVE                   PULMONIC VALVE AV Area (Vmax):    2.32 cm    PV Vmax:       0.83 m/s AV Area (Vmean):   2.21 cm    PV Vmean:      56.900 cm/s AV Area (VTI):     2.50 cm    PV VTI:        0.170 m AV Vmax:           126.00 cm/s PV Peak grad:  2.7 mmHg AV Vmean:          90.500 cm/s PV Mean grad:  1.0 mmHg AV VTI:            0.259 m AV Peak Grad:      6.4 mmHg AV Mean Grad:      4.0 mmHg LVOT Vmax:         84.50 cm/s LVOT Vmean:        57.700 cm/s LVOT VTI:          0.187 m LVOT/AV VTI ratio: 0.72  AORTA Ao Root diam: 2.90 cm MITRAL VALVE                TRICUSPID VALVE MV Area (PHT): 2.94 cm     TR Peak grad:   26.8 mmHg MV Peak grad:  11.4 mmHg    TR Vmax:        259.00 cm/s MV Mean grad:  5.0 mmHg MV Vmax:       1.69 m/s     SHUNTS MV Vmean:      107.0 cm/s   Systemic VTI:  0.19 m MV Decel Time: 258 msec     Systemic Diam: 2.10 cm MV E velocity: 162.00 cm/s MV A velocity: 132.00 cm/s MV E/A ratio:  1.23 Kathlyn Sacramento MD Electronically signed by Kathlyn Sacramento MD Signature Date/Time: 10/26/2019/4:42:34 PM    Final     Cardiac Studies   TTE (10/26/19): 1. Left  ventricular ejection fraction, by estimation, is 60 to 65%. The  left ventricle has normal function. The left ventricle has no regional  wall motion abnormalities. There is mild left ventricular hypertrophy.  Left ventricular diastolic parameters  are indeterminate.  2. Right ventricular systolic function is normal. The right ventricular  size is normal. There is normal pulmonary artery systolic pressure.  3. The mitral valve is  abnormal. Mild mitral valve regurgitation. Mild  mitral stenosis.  4. The aortic valve is normal in structure. Aortic valve regurgitation is  not visualized. Mild to moderate aortic valve sclerosis/calcification is  present, without any evidence of aortic stenosis.  5. The inferior vena cava is normal in size with greater than 50%  respiratory variability, suggesting right atrial pressure of 3 mmHg.   Patient Profile     86 y.o. female with history of mild carotid artery disease admitted with NSTEMI.  Assessment & Plan    NSTEMI: No further chest pain reported, though patient had exertional dyspnea with modest activity working with physical therapy today.  Echo yesterday showed normal LVEF without wall motion abnormality.  CTA of the chest to exclude PE made note of extensive three-vessel coronary artery calcification.  Ms. [name] previously declined catheterization and again states that she is not interested in proceeding with any invasive procedures at this time.  Continue IV heparin through this evening to complete at least 48 hours.  Continue clopidogrel monotherapy, given history of aspirin allergy.  We discussed benefits of noninvasive ischemia evaluation to determine extent of CAD, but given that Ms. [name] would not wish to proceed with cardiac catheterization, there is no indication for stress testing.  Continue high intensity statin therapy.  Continue low-dose metoprolol.  Add ranolazine 500 mg twice daily for antianginal therapy.  I am  reluctant to increase beta-blocker or add a long-acting nitrate in the setting of soft blood pressures.  We will need to monitor her QT interval with ranolazine, given that QTc was borderline prolonged on admission.  Hyperlipidemia: Severely elevated LDL noted on admission.  Continue atorvastatin 40 mg daily.  For questions or updates, please contact [facility] Please consult [email or web address] for contact info under [facility].     Signed, Nelva Bush, MD  10/27/2019, 12:09 PM

## 2019-10-27 NOTE — Progress Notes (Signed)
Progress Note    Michele [name]  UXN:[id number] DOB: 1928-04-09  DOA: 10/25/2019 PCP: Kirk Ruths, MD    Brief Narrative:    Medical records reviewed and are as summarized below:  Michele [name] is an 86 y.o. female with a past medical history that includes hypertension, macular degeneration, urinary frequency, carotid bruit, diverticulosis, GERD, hyperlipidemia was admitted June 6 from assisted living facility for NSTEMI.  She was started on a heparin drip as she is allergic to aspirin, evaluated by cardiology.  Cardiology and the patient decided served of therapy option with anticoagulation and medical therapy be the best course for her.  They recommended continuing heparin gtt for 48 hours, started plavix instead asa and low dose BB. Ranexa added 6/8.   Assessment/Plan:   Principal Problem:   NSTEMI (non-ST elevated myocardial infarction) [facility]) Active Problems:   Chest pain   Essential hypertension   Hyperlipidemia   Hypothyroidism   Left leg swelling   Macular degeneration  #1.  NSTEMI.  Chest pain-free this morning.  Echo yesterday reveals normal EF without wall motion abnormality.  CTA of the chest done to exclude PE did show but noted extensive three-vessel coronary artery calcification.  Declined cardiac catheterization has opted for medical management.  She is experiencing some dyspnea with exertion i.e. minimal exertion.  Evaluated by cardiology who recommend continuing heparin drip until this evening continuing Plavix instead of aspirin, low-dose beta-blocker and Ranexa was added. -Continue IV heparin through this evening to complete 48 hours -Continue Plavix monotherapy per cardiology -Statin -Low-dose beta-blocker -Ranexa added today per cardiology -Follow QT intervals  #2.  Hypertension.  Fair control with intermittent low end of normal.  Home meds include lisinopril.  Currently she is on a low-dose beta-blocker secondary to #1. -Holding  home lisinopril for now -Continue low-dose beta-blocker -Monitor -Mobilize as able  #3.Marland Kitchen  Home meds include a statin.  Cholesterol 271, LDL 197.  This was increased to 40 mg. -Continue statin  #4.  Hypothyroidism. tsh 0.294 -free T4 -continue home synthroid  #5.  Hyperglycemia.  Resolved. serum glucose was 217 on admission.  Hemoglobin A1c 5.8.  No history of diabetes.  Serum glucose this morning 82.  #6.  Left leg swelling.  No evidence of DVT bilaterally per duplex.  She is on a heparin drip as noted above   Family Communication/Anticipated D/C date and plan/Code Status   DVT prophylaxis: Heparin drip Code Status: DNR.  Family Communication: patient Disposition Plan: Status is: Inpatient  Remains inpatient appropriate because:IV treatments appropriate due to intensity of illness or inability to take PO   Dispo: The patient is from: SNF              Anticipated d/c is to: SNF              Anticipated d/c date is: 1 day              Patient currently is not medically stable to d/c.         Medical Consultants:    End cardiology   Anti-Infectives:    None  Subjective:   Sitting up in bed eating breakfast. Complains of DOE with minimal exertion. Denies chest pain  Objective:    Vitals:   10/26/19 1939 10/27/19 0426 10/27/19 0719 10/27/19 1106  BP: 120/63 116/64 (!) 133/56 (!) 111/52  Pulse: 64 (!) 58 (!) 58 60  Resp: 20 20 17 17   Temp: (!) 97.5 F (36.4 C)  97.8 F (36.6 C) 97.6 F (36.4 C) 97.7 F (36.5 C)  TempSrc: Oral Oral Oral   SpO2: 98% 100% 100% 100%  Weight:  54.4 kg    Height:        Intake/Output Summary (Last 24 hours) at 10/27/2019 1236 Last data filed at 10/27/2019 0945 Gross per 24 hour  Intake 609.63 ml  Output 2325 ml  Net -1715.37 ml   Filed Weights   10/25/19 1401 10/26/19 0300 10/27/19 0426  Weight: 54.9 kg 55.6 kg 54.4 kg    Exam: General: Alert slightly pale no acute distress CV: Regular rate and rhythm no lower  extremity edema Respiratory: No increased work of breathing with conversation but did become short of breath when trying to reposition self in bed.  Breath sounds are diminished throughout.  No wheeze no crackles Abdomen: Nondistended soft positive bowel sounds throughout nontender palpation no guarding or rebounding Musculoskeletal: Joints without swelling/erythema full range of motion Neuro: Alert and oriented x2.  Clearly has short-term memory issues.  Data Reviewed:   I have personally reviewed following labs and imaging studies:  Labs: Labs show the following:   Basic Metabolic Panel: Recent Labs  Lab 10/25/19 1416 10/25/19 1416 10/26/19 0204 10/27/19 0444  NA 138  --  140 140  K 4.2   < > 4.1 4.3  CL 103  --  104 102  CO2 24  --  29 30  GLUCOSE 217*  --  97 82  BUN 27*  --  22 21  CREATININE 0.85  --  0.78 0.76  CALCIUM 9.3  --  9.0 9.1  MG 1.9  --  1.9 2.1  PHOS 2.3*  --  3.1 3.3   < > = values in this interval not displayed.   GFR Estimated Creatinine Clearance: 38.7 mL/min (by C-G formula based on SCr of 0.76 mg/dL). Liver Function Tests: Recent Labs  Lab 10/25/19 1416 10/26/19 0204 10/27/19 0444  AST 27 35 30  ALT 16 15 16   ALKPHOS 60 47 48  BILITOT 0.8 0.8 0.7  PROT 7.0 6.0* 6.4*  ALBUMIN 4.1 3.5 3.7   Recent Labs  Lab 10/25/19 1416  LIPASE 54*   No results for input(s): AMMONIA in the last 168 hours. Coagulation profile Recent Labs  Lab 10/25/19 1416  INR 1.0    CBC: Recent Labs  Lab 10/25/19 1416 10/26/19 0204 10/27/19 0444  WBC 9.4 9.9 7.5  NEUTROABS 8.1* 6.1 3.1  HGB 12.4 11.1* 12.3  HCT 37.6 33.4* 37.9  MCV 91.9 91.0 93.3  PLT 237 225 238   Cardiac Enzymes: No results for input(s): CKTOTAL, CKMB, CKMBINDEX, TROPONINI in the last 168 hours. BNP (last 3 results) No results for input(s): PROBNP in the last 8760 hours. CBG: Recent Labs  Lab 10/26/19 2050  GLUCAP 100*   D-Dimer: No results for input(s): DDIMER in the last  72 hours. Hgb A1c: Recent Labs    10/26/19 0204  HGBA1C 5.8*   Lipid Profile: Recent Labs    10/26/19 0204  CHOL 271*  HDL 54  LDLCALC 197*  TRIG 101  CHOLHDL 5.0   Thyroid function studies: Recent Labs    10/26/19 0204  TSH 0.294*   Anemia work up: No results for input(s): VITAMINB12, FOLATE, FERRITIN, TIBC, IRON, RETICCTPCT in the last 72 hours. Sepsis Labs: Recent Labs  Lab 10/25/19 1416 10/26/19 0204 10/27/19 0444  WBC 9.4 9.9 7.5    Microbiology Recent Results (from the past 240 hour(s))  SARS  Coronavirus 2 by RT PCR (hospital order, performed in [facility] hospital lab) Nasopharyngeal Nasopharyngeal Swab     Status: None   Collection Time: 10/25/19  5:37 PM   Specimen: Nasopharyngeal Swab  Result Value Ref Range Status   SARS Coronavirus 2 NEGATIVE NEGATIVE Final    Comment: (NOTE) SARS-CoV-2 target nucleic acids are NOT DETECTED. The SARS-CoV-2 RNA is generally detectable in upper and lower respiratory specimens during the acute phase of infection. The lowest concentration of SARS-CoV-2 viral copies this assay can detect is 250 copies / mL. A negative result does not preclude SARS-CoV-2 infection and should not be used as the sole basis for treatment or other patient management decisions.  A negative result may occur with improper specimen collection / handling, submission of specimen other than nasopharyngeal swab, presence of viral mutation(s) within the areas targeted by this assay, and inadequate number of viral copies (<250 copies / mL). A negative result must be combined with clinical observations, patient history, and epidemiological information. Fact Sheet for Patients:   StrictlyIdeas.no Fact Sheet for Healthcare Providers: BankingDealers.co.za This test is not yet approved or cleared  by the [place] FDA and has been authorized for detection and/or diagnosis of SARS-CoV-2 by FDA under an  Emergency Use Authorization (EUA).  This EUA will remain in effect (meaning this test can be used) for the duration of the COVID-19 declaration under Section 564(b)(1) of the Act, 21 U.S.C. section 360bbb-3(b)(1), unless the authorization is terminated or revoked sooner. Performed at [facility], [address] [place], [place] [address]   MRSA PCR Screening     Status: None   Collection Time: 10/25/19 11:07 PM   Specimen: Nasopharyngeal  Result Value Ref Range Status   MRSA by PCR NEGATIVE NEGATIVE Final    Comment:        The GeneXpert MRSA Assay (FDA approved for NASAL specimens only), is one component of a comprehensive MRSA colonization surveillance program. It is not intended to diagnose MRSA infection nor to guide or monitor treatment for MRSA infections. Performed at [facility], [address], [place], [place] [address]     Procedures and diagnostic studies:  DG Chest 2 View  Result Date: 10/25/2019 CLINICAL DATA:  Patient with chest pain. EXAM: CHEST - 2 VIEW COMPARISON:  Chest radiograph 09/10/2016. FINDINGS: Normal cardiac and mediastinal contours. Tortuosity of the thoracic aorta. No large area pulmonary consolidation. No pleural effusion or pneumothorax. Emphysematous changes. Redemonstrated T12 compression deformity. IMPRESSION: No acute cardiopulmonary process. Electronically Signed   By: Lovey Newcomer M.D.   On: 10/25/2019 15:18   CT Angio Chest PE W and/or Wo Contrast  Result Date: 10/25/2019 CLINICAL DATA:  Chest pain, PE suspected EXAM: CT ANGIOGRAPHY CHEST WITH CONTRAST TECHNIQUE: Multidetector CT imaging of the chest was performed using the standard protocol during bolus administration of intravenous contrast. Multiplanar CT image reconstructions and MIPs were obtained to evaluate the vascular anatomy. CONTRAST:  31mL OMNIPAQUE IOHEXOL 350 MG/ML SOLN COMPARISON:  09/10/2016 FINDINGS: Cardiovascular: Satisfactory opacification of the  pulmonary arteries to the segmental level. No evidence of pulmonary embolism. Normal heart size. Extensive 3 vessel coronary artery calcifications and/or stents. No pericardial effusion. Severe mixed aortic atherosclerosis. Mediastinum/Nodes: No enlarged mediastinal, hilar, or axillary lymph nodes. Thyroid gland, trachea, and esophagus demonstrate no significant findings. Lungs/Pleura: Lungs are clear. No pleural effusion or pneumothorax. Upper Abdomen: No acute abnormality. Musculoskeletal: No chest wall abnormality. There is a new, although age indeterminate high-grade wedge deformity of the  L1 vertebral body, partially imaged. Review of the MIP images confirms the above findings. IMPRESSION: 1. Negative examination for pulmonary embolism. 2. Coronary artery disease. Aortic Atherosclerosis (ICD10-I70.0). 3. New, although age indeterminate high-grade wedge deformity of the L1 vertebral body, partially imaged. Correlate for acute pain and point tenderness. Electronically Signed   By: Eddie Candle M.D.   On: 10/25/2019 17:05   US Venous Img Lower Bilateral (DVT)  Result Date: 10/25/2019 CLINICAL DATA:  86 year old with elevated D-dimer and leg swelling. EXAM: BILATERAL LOWER EXTREMITY VENOUS DOPPLER ULTRASOUND TECHNIQUE: Gray-scale sonography with compression, as well as color and duplex ultrasound, were performed to evaluate the deep venous system(s) from the level of the common femoral vein through the popliteal and proximal calf veins. COMPARISON:  02/12/2013 bilateral lower extremity duplex. FINDINGS: VENOUS Normal compressibility of the common femoral, superficial femoral, and popliteal veins, as well as the visualized calf veins. Visualized portions of profunda femoral vein and great saphenous vein unremarkable. No filling defects to suggest DVT on grayscale or color Doppler imaging. Doppler waveforms show normal direction of venous flow, normal respiratory plasticity and response to augmentation. OTHER  None. Limitations: none IMPRESSION: No evidence of bilateral lower extremity DVT. Electronically Signed   By: Keith Rake M.D.   On: 10/25/2019 20:59   ECHOCARDIOGRAM COMPLETE  Result Date: 10/26/2019    ECHOCARDIOGRAM REPORT   Patient Name:   [name] Date of Exam: 10/26/2019 Medical Rec #:  [id number]                Height:       63.0 in Accession #:    [id number]               Weight:       122.6 lb Date of Birth:  [birth year]                BSA:          1.571 m Patient Age:    89 years                 BP:           110/54 mmHg Patient Gender: F                        HR:           62 bpm. Exam Location:  [facility] Procedure: 2D Echo, Cardiac Doppler and Color Doppler Indications:     I21.4 NSTEMI  History:         Patient has prior history of Echocardiogram examinations. Risk                  Factors:Hypertension and Dyslipidemia.  Sonographer:     Charmayne Sheer RDCS (AE) Referring Phys:  [id number] Georgina Quint LATIF [facility] Diagnosing Phys: Kathlyn Sacramento MD IMPRESSIONS  1. Left ventricular ejection fraction, by estimation, is 60 to 65%. The left ventricle has normal function. The left ventricle has no regional wall motion abnormalities. There is mild left ventricular hypertrophy. Left ventricular diastolic parameters are indeterminate.  2. Right ventricular systolic function is normal. The right ventricular size is normal. There is normal pulmonary artery systolic pressure.  3. The mitral valve is abnormal. Mild mitral valve regurgitation. Mild mitral stenosis.  4. The aortic valve is normal in structure. Aortic valve regurgitation is not visualized. Mild to moderate aortic valve sclerosis/calcification is present, without any evidence of aortic stenosis.  5. The inferior vena cava  is normal in size with greater than 50% respiratory variability, suggesting right atrial pressure of 3 mmHg. FINDINGS  Left Ventricle: Left ventricular ejection fraction, by estimation, is 60 to 65%. The left ventricle has normal  function. The left ventricle has no regional wall motion abnormalities. The left ventricular internal cavity size was normal in size. There is  mild left ventricular hypertrophy. Left ventricular diastolic parameters are indeterminate. Right Ventricle: The right ventricular size is normal. No increase in right ventricular wall thickness. Right ventricular systolic function is normal. There is normal pulmonary artery systolic pressure. The tricuspid regurgitant velocity is 2.59 m/s, and  with an assumed right atrial pressure of 3 mmHg, the estimated right ventricular systolic pressure is 62.8 mmHg. Left Atrium: Left atrial size was normal in size. Right Atrium: Right atrial size was normal in size. Pericardium: There is no evidence of pericardial effusion. Mitral Valve: The mitral valve is abnormal. There is severe thickening of the mitral valve leaflet(s). There is severe calcification of the mitral valve leaflet(s). Normal mobility of the mitral valve leaflets. Severe mitral annular calcification. Mild mitral valve regurgitation. Mild mitral valve stenosis. MV peak gradient, 11.4 mmHg. The mean mitral valve gradient is 5.0 mmHg. Tricuspid Valve: The tricuspid valve is normal in structure. Tricuspid valve regurgitation is mild . No evidence of tricuspid stenosis. Aortic Valve: The aortic valve is normal in structure. Aortic valve regurgitation is not visualized. Mild to moderate aortic valve sclerosis/calcification is present, without any evidence of aortic stenosis. Aortic valve mean gradient measures 4.0 mmHg. Aortic valve peak gradient measures 6.4 mmHg. Aortic valve area, by VTI measures 2.50 cm. Pulmonic Valve: The pulmonic valve was normal in structure. Pulmonic valve regurgitation is not visualized. No evidence of pulmonic stenosis. Aorta: The aortic root is normal in size and structure. Venous: The inferior vena cava is normal in size with greater than 50% respiratory variability, suggesting right atrial  pressure of 3 mmHg. IAS/Shunts: No atrial level shunt detected by color flow Doppler.  LEFT VENTRICLE PLAX 2D LVIDd:         3.38 cm  Diastology LVIDs:         2.64 cm  LV e' lateral:   5.33 cm/s LV PW:         0.87 cm  LV E/e' lateral: 30.4 LV IVS:        0.85 cm  LV e' medial:    5.87 cm/s LVOT diam:     2.10 cm  LV E/e' medial:  27.6 LV SV:         65 LV SV Index:   41 LVOT Area:     3.46 cm  LEFT ATRIUM             Index LA diam:        3.70 cm 2.36 cm/m LA Vol (A2C):   45.2 ml 28.78 ml/m LA Vol (A4C):   49.4 ml 31.45 ml/m LA Biplane Vol: 48.0 ml 30.56 ml/m  AORTIC VALVE                   PULMONIC VALVE AV Area (Vmax):    2.32 cm    PV Vmax:       0.83 m/s AV Area (Vmean):   2.21 cm    PV Vmean:      56.900 cm/s AV Area (VTI):     2.50 cm    PV VTI:        0.170 m AV Vmax:  126.00 cm/s PV Peak grad:  2.7 mmHg AV Vmean:          90.500 cm/s PV Mean grad:  1.0 mmHg AV VTI:            0.259 m AV Peak Grad:      6.4 mmHg AV Mean Grad:      4.0 mmHg LVOT Vmax:         84.50 cm/s LVOT Vmean:        57.700 cm/s LVOT VTI:          0.187 m LVOT/AV VTI ratio: 0.72  AORTA Ao Root diam: 2.90 cm MITRAL VALVE                TRICUSPID VALVE MV Area (PHT): 2.94 cm     TR Peak grad:   26.8 mmHg MV Peak grad:  11.4 mmHg    TR Vmax:        259.00 cm/s MV Mean grad:  5.0 mmHg MV Vmax:       1.69 m/s     SHUNTS MV Vmean:      107.0 cm/s   Systemic VTI:  0.19 m MV Decel Time: 258 msec     Systemic Diam: 2.10 cm MV E velocity: 162.00 cm/s MV A velocity: 132.00 cm/s MV E/A ratio:  1.23 Kathlyn Sacramento MD Electronically signed by Kathlyn Sacramento MD Signature Date/Time: 10/26/2019/4:42:34 PM    Final     Medications:   . alum & mag hydroxide-simeth  30 mL Oral Once  . amoxicillin-clavulanate  1 tablet Oral BID  . atorvastatin  40 mg Oral QPM  . clopidogrel  75 mg Oral Daily  . DULoxetine  30 mg Oral Daily  . levothyroxine  100 mcg Oral Daily  . metoprolol tartrate  25 mg Oral BID  . multivitamin-lutein  1  capsule Oral Daily  . pantoprazole  40 mg Oral Daily  . ranolazine  500 mg Oral BID  . sodium chloride flush  10 mL Intravenous Q12H   Continuous Infusions: . heparin 650 Units/hr (10/26/19 2256)     LOS: 2 days   Radene Gunning NP  [facility]   How to contact the [facility] Attending or Consulting provider [place] or covering provider during after hours [place], for this patient?  1. Check the care team in [facility] and look for a) attending/consulting [facility] provider listed and b) the [facility] team listed 2. Log into [email or web address] and use [place]'s universal password to access. If you do not have the password, please contact the hospital operator. 3. Locate the [facility] provider you are looking for under [facility] and page to a number that you can be directly reached. 4. If you still have difficulty reaching the provider, please page the [facility] (Director on Call) for the Hospitalists listed on amion for assistance.  10/27/2019, 12:36 PM

## 2019-10-28 ENCOUNTER — Encounter
Admission: RE | Admit: 2019-10-28 | Discharge: 2019-10-28 | Disposition: A | Discharge status: Home / Self Care | Payer: Medicare HMO | Source: Ambulatory Visit | Attending: Internal Medicine | Admitting: Internal Medicine

## 2019-10-28 LAB — CBC
HCT: 36.7 % (ref 36.0–46.0)
Hemoglobin: 12.1 g/dL (ref 12.0–15.0)
MCH: 30.1 pg (ref 26.0–34.0)
MCHC: 33 g/dL (ref 30.0–36.0)
MCV: 91.3 fL (ref 80.0–100.0)
Platelets: 230 10*3/uL (ref 150–400)
RBC: 4.02 MIL/uL (ref 3.87–5.11)
RDW: 13.1 % (ref 11.5–15.5)
WBC: 6.3 10*3/uL (ref 4.0–10.5)
nRBC: 0 % (ref 0.0–0.2)

## 2019-10-28 LAB — T4, FREE: Free T4: 1.25 ng/dL — ABNORMAL HIGH (ref 0.61–1.12)

## 2019-10-28 LAB — HEPARIN LEVEL (UNFRACTIONATED): Heparin Unfractionated: <0.1 IU/mL — ABNORMAL LOW (ref 0.30–0.70)

## 2019-10-28 MED ORDER — ATORVASTATIN CALCIUM 40 MG PO TABS: 40.0000 mg | ORAL_TABLET | Freq: Every evening | ORAL | 0 refills | Status: AC

## 2019-10-28 MED ORDER — METOPROLOL TARTRATE 25 MG PO TABS: 25.0000 mg | ORAL_TABLET | Freq: Two times a day (BID) | ORAL | 0 refills | Status: AC

## 2019-10-28 MED ORDER — RANOLAZINE ER 500 MG PO TB12: 500.0000 mg | ORAL_TABLET | Freq: Two times a day (BID) | ORAL | 0 refills | Status: AC

## 2019-10-28 MED ORDER — CLOPIDOGREL BISULFATE 75 MG PO TABS: 75.0000 mg | ORAL_TABLET | Freq: Every day | ORAL | 0 refills | Status: AC

## 2019-10-28 NOTE — Discharge Summary (Addendum)
Physician Discharge Summary  Michele [name] JXB:[id number] DOB: 03-30-1928 DOA: 10/25/2019  PCP: Kirk Ruths, MD  Admit date: 10/25/2019 Discharge date: 10/28/2019  Admitted From: facility [facility] Discharge disposition: [facility]   Recommendations for Outpatient Follow-Up:   1. Follow up with PCP 1-2 weeks for evaluation of DOE, chest pain and bunion infection as well as thyroid function/synthroid dose and continued need for prednisone 2. Follow up with cardiology 2-3 weeks for evaluation of CP and current medical management   Discharge Diagnosis:   Principal Problem:   NSTEMI (non-ST elevated myocardial infarction) [facility]) Active Problems:   Chest pain   Essential hypertension   Hyperlipidemia   Hypothyroidism   Left leg swelling   Macular degeneration    Discharge Condition: Improved.  Diet recommendation: Low sodium, heart healthy. .  Wound care: None.  Code status: DNR   History of Present Illness:   Michele [name] is a 86 y.o. female with medical history significant of arthritis, history of carotid bruit, constipation, diverticulosis, GERD, hypertension, hyperlipidemia, hypothyroidism, history of macular degeneration, osteopenia, urinary frequency as well as other comorbidities who presented 6/6 with chief complaint of chest pain.  Stated that she developed substernal Left sided chest pain that radiated into the middle described as sharp and severe 10/10 ntensity.  She had associated symptoms of nausea but no vomiting, shortness of breath but no lightheadedness or dizziness.  Stated that 2 days prior she woke up with diaphoresis and had some severe pain in her left arm. She was given Nitropaste in the ED and denied any shortness breath, nausea, lightheadedness.  She stated the pain started shortly after she ate a meal and she stated that she was never had this type of pain in the past before.  One day prior went to get treated for her  bunions. [facility] was asked admit this patient for NSTEMI and cardiology was consulted her EKG did show anterior lateral ST depressions and elevated troponin level.  D-dimer was elevated and so she ended up having a CTA of the chest which showed no evidence of PE.   In the ED she had basic blood work done and had troponin cycled.  She had a chest x-ray and EKG.  Covid testing was pending and she also had a CTA of the chest given her elevated D-dimer.  She was given acetaminophen and Nitropaste and placed on a heparin drip   Hospital Course by Problem:   #1.  NSTEMI.Troponin level went from 25 -> 416 -> 5859 -> 6728.  placed on Heparin gtt for 48 hours. Echo revealed normal EF without wall motion abnormality.  CTA of the chest done to exclude PE which it did but did note extensive three-vessel coronary artery calcification.  Declined cardiac catheterization has opted for medical management.  She was experiencing some dyspnea with exertion i.e. minimal exertion.  Evaluated by cardiology. Discussed option of PCI as well as medical management. Patient elected medical management.  Cards recommended Plavix instead of aspirin, low-dose beta-blocker and Ranexa.  #2.  Hypertension.  Fair control with intermittent low end of normal.  Home meds include lisinopril. this was held in hospital for soft BP. Cards recommended a low-dose beta-blocker secondary to #1. Did not resume ACE at discharge due to BP being on low end of normal.   #3.Marland KitchenHyperlipidemia. Home meds include a statin 10mg .  Cholesterol 271, LDL 197.  This was increased to 40 mg.  #4.  Hypothyroidism. tsh 0.294, free T4 1.25.  OP follow up with PCP.   #5.  Hyperglycemia.  Resolved. serum glucose was 217 on admission. Home meds include prednisone. Hemoglobin A1c 5.8.  No history of diabetes.  #6.  Left leg swelling.  No evidence of DVT bilaterally per duplex.     Medical Consultants:   End cardiology [place] [facility]   Discharge Exam:    Vitals:   10/28/19 0714 10/28/19 1128  BP: 111/64 (!) 125/44  Pulse: 69 66  Resp: 16   Temp: 98 F (36.7 C) 97.7 F (36.5 C)  SpO2: 97% 96%   Vitals:   10/28/19 0447 10/28/19 0500 10/28/19 0714 10/28/19 1128  BP: 103/60  111/64 (!) 125/44  Pulse: 65  69 66  Resp: 20  16   Temp: 97.7 F (36.5 C)  98 F (36.7 C) 97.7 F (36.5 C)  TempSrc: Oral  Oral Oral  SpO2: 96%  97% 96%  Weight:  53.9 kg    Height:        General exam: Appears calm and comfortable. Somewhat frail appearing Respiratory system: Clear to auscultation. Respiratory effort normal. Cardiovascular system: S1 & S2 heard, RRR. No JVD,  rubs, gallops or clicks. + murmurs. Gastrointestinal system: Abdomen is nondistended, soft and nontender. No organomegaly or masses felt. Normal bowel sounds heard. Central nervous system: Alert and oriented. No focal neurological deficits. Extremities: No clubbing,  or cyanosis. No edema. Skin: No rashes, lesions or ulcers. Psychiatry: Judgement and insight appear normal. Mood & affect appropriate.    The results of significant diagnostics from this hospitalization (including imaging, microbiology, ancillary and laboratory) are listed below for reference.     Procedures and Diagnostic Studies:   ECHOCARDIOGRAM COMPLETE  Result Date: 10/26/2019    ECHOCARDIOGRAM REPORT   Patient Name:   Michele [name] [facility] Date of Exam: 10/26/2019 Medical Rec #:  [id number]                Height:       63.0 in Accession #:    [id number]               Weight:       122.6 lb Date of Birth:  Mar 27, 1928                BSA:          1.571 m Patient Age:    45 years                 BP:           110/54 mmHg Patient Gender: F                        HR:           62 bpm. Exam Location:  [facility] Procedure: 2D Echo, Cardiac Doppler and Color Doppler Indications:     I21.4 NSTEMI  History:         Patient has prior history of Echocardiogram examinations. Risk                  Factors:Hypertension and  Dyslipidemia.  Sonographer:     Charmayne Sheer RDCS (AE) Referring Phys:  [id number] Georgina Quint LATIF [facility] Diagnosing Phys: Kathlyn Sacramento MD IMPRESSIONS  1. Left ventricular ejection fraction, by estimation, is 60 to 65%. The left ventricle has normal function. The left ventricle has no regional wall motion abnormalities. There is mild left ventricular hypertrophy. Left ventricular diastolic parameters are indeterminate.  2. Right  ventricular systolic function is normal. The right ventricular size is normal. There is normal pulmonary artery systolic pressure.  3. The mitral valve is abnormal. Mild mitral valve regurgitation. Mild mitral stenosis.  4. The aortic valve is normal in structure. Aortic valve regurgitation is not visualized. Mild to moderate aortic valve sclerosis/calcification is present, without any evidence of aortic stenosis.  5. The inferior vena cava is normal in size with greater than 50% respiratory variability, suggesting right atrial pressure of 3 mmHg. FINDINGS  Left Ventricle: Left ventricular ejection fraction, by estimation, is 60 to 65%. The left ventricle has normal function. The left ventricle has no regional wall motion abnormalities. The left ventricular internal cavity size was normal in size. There is  mild left ventricular hypertrophy. Left ventricular diastolic parameters are indeterminate. Right Ventricle: The right ventricular size is normal. No increase in right ventricular wall thickness. Right ventricular systolic function is normal. There is normal pulmonary artery systolic pressure. The tricuspid regurgitant velocity is 2.59 m/s, and  with an assumed right atrial pressure of 3 mmHg, the estimated right ventricular systolic pressure is 08.6 mmHg. Left Atrium: Left atrial size was normal in size. Right Atrium: Right atrial size was normal in size. Pericardium: There is no evidence of pericardial effusion. Mitral Valve: The mitral valve is abnormal. There is severe thickening of the  mitral valve leaflet(s). There is severe calcification of the mitral valve leaflet(s). Normal mobility of the mitral valve leaflets. Severe mitral annular calcification. Mild mitral valve regurgitation. Mild mitral valve stenosis. MV peak gradient, 11.4 mmHg. The mean mitral valve gradient is 5.0 mmHg. Tricuspid Valve: The tricuspid valve is normal in structure. Tricuspid valve regurgitation is mild . No evidence of tricuspid stenosis. Aortic Valve: The aortic valve is normal in structure. Aortic valve regurgitation is not visualized. Mild to moderate aortic valve sclerosis/calcification is present, without any evidence of aortic stenosis. Aortic valve mean gradient measures 4.0 mmHg. Aortic valve peak gradient measures 6.4 mmHg. Aortic valve area, by VTI measures 2.50 cm. Pulmonic Valve: The pulmonic valve was normal in structure. Pulmonic valve regurgitation is not visualized. No evidence of pulmonic stenosis. Aorta: The aortic root is normal in size and structure. Venous: The inferior vena cava is normal in size with greater than 50% respiratory variability, suggesting right atrial pressure of 3 mmHg. IAS/Shunts: No atrial level shunt detected by color flow Doppler.  LEFT VENTRICLE PLAX 2D LVIDd:         3.38 cm  Diastology LVIDs:         2.64 cm  LV e' lateral:   5.33 cm/s LV PW:         0.87 cm  LV E/e' lateral: 30.4 LV IVS:        0.85 cm  LV e' medial:    5.87 cm/s LVOT diam:     2.10 cm  LV E/e' medial:  27.6 LV SV:         65 LV SV Index:   41 LVOT Area:     3.46 cm  LEFT ATRIUM             Index LA diam:        3.70 cm 2.36 cm/m LA Vol (A2C):   45.2 ml 28.78 ml/m LA Vol (A4C):   49.4 ml 31.45 ml/m LA Biplane Vol: 48.0 ml 30.56 ml/m  AORTIC VALVE                   PULMONIC VALVE AV Area (  Vmax):    2.32 cm    PV Vmax:       0.83 m/s AV Area (Vmean):   2.21 cm    PV Vmean:      56.900 cm/s AV Area (VTI):     2.50 cm    PV VTI:        0.170 m AV Vmax:           126.00 cm/s PV Peak grad:  2.7 mmHg AV  Vmean:          90.500 cm/s PV Mean grad:  1.0 mmHg AV VTI:            0.259 m AV Peak Grad:      6.4 mmHg AV Mean Grad:      4.0 mmHg LVOT Vmax:         84.50 cm/s LVOT Vmean:        57.700 cm/s LVOT VTI:          0.187 m LVOT/AV VTI ratio: 0.72  AORTA Ao Root diam: 2.90 cm MITRAL VALVE                TRICUSPID VALVE MV Area (PHT): 2.94 cm     TR Peak grad:   26.8 mmHg MV Peak grad:  11.4 mmHg    TR Vmax:        259.00 cm/s MV Mean grad:  5.0 mmHg MV Vmax:       1.69 m/s     SHUNTS MV Vmean:      107.0 cm/s   Systemic VTI:  0.19 m MV Decel Time: 258 msec     Systemic Diam: 2.10 cm MV E velocity: 162.00 cm/s MV A velocity: 132.00 cm/s MV E/A ratio:  1.23 Kathlyn Sacramento MD Electronically signed by Kathlyn Sacramento MD Signature Date/Time: 10/26/2019/4:42:34 PM    Final      Labs:   Basic Metabolic Panel: Recent Labs  Lab 10/25/19 1416 10/25/19 1416 10/26/19 0204 10/27/19 0444  NA 138  --  140 140  K 4.2   < > 4.1 4.3  CL 103  --  104 102  CO2 24  --  29 30  GLUCOSE 217*  --  97 82  BUN 27*  --  22 21  CREATININE 0.85  --  0.78 0.76  CALCIUM 9.3  --  9.0 9.1  MG 1.9  --  1.9 2.1  PHOS 2.3*  --  3.1 3.3   < > = values in this interval not displayed.   GFR Estimated Creatinine Clearance: 38.7 mL/min (by C-G formula based on SCr of 0.76 mg/dL). Liver Function Tests: Recent Labs  Lab 10/25/19 1416 10/26/19 0204 10/27/19 0444  AST 27 35 30  ALT 16 15 16   ALKPHOS 60 47 48  BILITOT 0.8 0.8 0.7  PROT 7.0 6.0* 6.4*  ALBUMIN 4.1 3.5 3.7   Recent Labs  Lab 10/25/19 1416  LIPASE 54*   No results for input(s): AMMONIA in the last 168 hours. Coagulation profile Recent Labs  Lab 10/25/19 1416  INR 1.0    CBC: Recent Labs  Lab 10/25/19 1416 10/26/19 0204 10/27/19 0444 10/28/19 0557  WBC 9.4 9.9 7.5 6.3  NEUTROABS 8.1* 6.1 3.1  --   HGB 12.4 11.1* 12.3 12.1  HCT 37.6 33.4* 37.9 36.7  MCV 91.9 91.0 93.3 91.3  PLT 237 225 238 230   Cardiac Enzymes: No results for input(s):  CKTOTAL, CKMB, CKMBINDEX, TROPONINI in the last 168  hours. BNP: Invalid input(s): POCBNP CBG: Recent Labs  Lab 10/26/19 2050  GLUCAP 100*   D-Dimer No results for input(s): DDIMER in the last 72 hours. Hgb A1c Recent Labs    10/26/19 0204  HGBA1C 5.8*   Lipid Profile Recent Labs    10/26/19 0204  CHOL 271*  HDL 54  LDLCALC 197*  TRIG 101  CHOLHDL 5.0   Thyroid function studies Recent Labs    10/26/19 0204  TSH 0.294*   Anemia work up No results for input(s): VITAMINB12, FOLATE, FERRITIN, TIBC, IRON, RETICCTPCT in the last 72 hours. Microbiology Recent Results (from the past 240 hour(s))  SARS Coronavirus 2 by RT PCR (hospital order, performed in [facility] hospital lab) Nasopharyngeal Nasopharyngeal Swab     Status: None   Collection Time: 10/25/19  5:37 PM   Specimen: Nasopharyngeal Swab  Result Value Ref Range Status   SARS Coronavirus 2 NEGATIVE NEGATIVE Final    Comment: (NOTE) SARS-CoV-2 target nucleic acids are NOT DETECTED. The SARS-CoV-2 RNA is generally detectable in upper and lower respiratory specimens during the acute phase of infection. The lowest concentration of SARS-CoV-2 viral copies this assay can detect is 250 copies / mL. A negative result does not preclude SARS-CoV-2 infection and should not be used as the sole basis for treatment or other patient management decisions.  A negative result may occur with improper specimen collection / handling, submission of specimen other than nasopharyngeal swab, presence of viral mutation(s) within the areas targeted by this assay, and inadequate number of viral copies (<250 copies / mL). A negative result must be combined with clinical observations, patient history, and epidemiological information. Fact Sheet for Patients:   StrictlyIdeas.no Fact Sheet for Healthcare Providers: BankingDealers.co.za This test is not yet approved or cleared  by the [place] FDA and has been authorized for detection and/or diagnosis of SARS-CoV-2 by FDA under an Emergency Use Authorization (EUA).  This EUA will remain in effect (meaning this test can be used) for the duration of the COVID-19 declaration under Section 564(b)(1) of the Act, 21 U.S.C. section 360bbb-3(b)(1), unless the authorization is terminated or revoked sooner. Performed at [facility], [address] [place], [place] [address]   MRSA PCR Screening     Status: None   Collection Time: 10/25/19 11:07 PM   Specimen: Nasopharyngeal  Result Value Ref Range Status   MRSA by PCR NEGATIVE NEGATIVE Final    Comment:        The GeneXpert MRSA Assay (FDA approved for NASAL specimens only), is one component of a comprehensive MRSA colonization surveillance program. It is not intended to diagnose MRSA infection nor to guide or monitor treatment for MRSA infections. Performed at [facility], [address] [place], [place] [address]      Discharge Instructions:   Discharge Instructions    Call MD for:  persistant dizziness or light-headedness   Complete by: As directed    Call MD for:  temperature >100.4   Complete by: As directed    Diet - low sodium heart healthy   Complete by: As directed    Discharge instructions   Complete by: As directed    Take medications as directed Heart Healthy diet Follow up with PCP 1-2 weeks for evaluation of BP and dyspnea with exertion as well as bunion healing   Increase activity slowly   Complete by: As directed      Allergies as of 10/28/2019      Reactions  Aspirin Other (See Comments)   Patient collapsed, was rushed to the hospital and hospitalized for a week.   Other Nausea And Vomiting, Other (See Comments)   AGENT:  anesthesia   Scallops [shellfish Allergy] Nausea And Vomiting   Headaches   Sulfa Antibiotics Hives   Docosahexaenoic Acid (dha) Other (See Comments)   GI upset.   Codeine Nausea And Vomiting     Morphine And Related Nausea And Vomiting      Medication List    STOP taking these medications   lisinopril 10 MG tablet Commonly known as: ZESTRIL   rosuvastatin 10 MG tablet Commonly known as: CRESTOR     TAKE these medications   acetaminophen 500 MG tablet Commonly known as: TYLENOL Take 500 mg by mouth every 4 (four) hours as needed for mild pain or fever.   amoxicillin-clavulanate 875-125 MG tablet Commonly known as: AUGMENTIN Take 1 tablet by mouth 2 (two) times daily.   atorvastatin 40 MG tablet Commonly known as: LIPITOR Take 1 tablet (40 mg total) by mouth every evening.   clopidogrel 75 MG tablet Commonly known as: PLAVIX Take 1 tablet (75 mg total) by mouth daily. Start taking on: October 29, 2019   diclofenac Sodium 1 % Gel Commonly known as: VOLTAREN Apply 2-4 g topically in the morning, at noon, in the evening, and at bedtime.   DULoxetine 30 MG capsule Commonly known as: CYMBALTA Take 30 mg by mouth daily.   famotidine 20 MG tablet Commonly known as: PEPCID Take 20 mg by mouth 2 (two) times daily.   Klor-Con M10 10 MEQ tablet Generic drug: potassium chloride Take 10 mEq by mouth daily.   Krill Oil 300 MG Caps Take 300 mg by mouth daily.   metoprolol tartrate 25 MG tablet Commonly known as: LOPRESSOR Take 1 tablet (25 mg total) by mouth 2 (two) times daily.   pantoprazole 40 MG tablet Commonly known as: PROTONIX Take 40 mg by mouth daily.   predniSONE 50 MG tablet Commonly known as: DELTASONE Take 50 mg by mouth daily.   PRESERVISION AREDS 2 PO Take 1 capsule by mouth 2 (two) times daily.   ranolazine 500 MG 12 hr tablet Commonly known as: RANEXA Take 1 tablet (500 mg total) by mouth 2 (two) times daily.   Synthroid 100 MCG tablet Generic drug: levothyroxine Take 100 mcg by mouth daily.         Time coordinating discharge: 40 minutes  Signed:  Radene Gunning NP  [facility] 10/28/2019, 11:55 AM

## 2019-10-28 NOTE — TOC Transition Note (Signed)
Transition of Care [facility]) - CM/SW Discharge Note   Patient Details  Name: Michele [name] MRN: [id number] Date of Birth: 17-Apr-1928  [facility] CM/SW Contact:  Eileen Stanford, LCSW Phone Number: 10/28/2019, 12:13 PM   Clinical Narrative: Clinical Social Worker facilitated patient discharge including contacting patient family and facility to confirm patient discharge plans.  Clinical information faxed to facility and family agreeable with plan.  CSW arranged ambulance transport via [facility] to [facility].  RN to call [phone]  for report prior to discharge.    Final next level of care: Skilled Nursing Facility Barriers to Discharge: No Barriers Identified   Patient Goals and CMS Choice        Discharge Placement              Patient [facility] bed at: [facility] Patient to be transferred to facility by: [facility] Name of family member notified: Mickel Baas Patient and family notified of of transfer: 10/28/19  Discharge Plan and Services                                     Social Determinants of Health (SDOH) Interventions     Readmission Risk Interventions No flowsheet data found.

## 2019-10-28 NOTE — Progress Notes (Signed)
Pt transferred by EMS to facility. Per EMS, ambulance services will not be covered by insurance since pt is ambulatory. This RN contacted Shelton Silvas with case management, Shelton Silvas stated [place] did not have any means of transportation for the patient.  EMS stated they spoke to "Michele [name]" at [facility] and stated they had vans available to pick up residents from the hospital, therefore did not need EMS services. Pt still transported by EMS. This RN made Evette aware.

## 2019-10-28 NOTE — Progress Notes (Signed)
This RN gave report to [place], Therapist, sports receiving patient at [facility]. Pt will be transferred back to SNF by EMS.

## 2019-10-28 NOTE — Care Management Important Message (Signed)
Important Message  Patient Details  Name: Michele [name] MRN: [id number] Date of Birth: 1927/10/20   Medicare Important Message Given:  Yes     Dannette Barbara 10/28/2019, 11:29 AM

## 2019-10-28 NOTE — Progress Notes (Signed)
Progress Note  Patient Name: Michele [name] Date of Encounter: 10/28/2019  [facility] Cardiologist: Dr. Fletcher Anon  Subjective   Denies chest pain or shortness of breath.  No acute events overnight.  Inpatient Medications    Scheduled Meds: . alum & mag hydroxide-simeth  30 mL Oral Once  . amoxicillin-clavulanate  1 tablet Oral BID  . atorvastatin  40 mg Oral QPM  . clopidogrel  75 mg Oral Daily  . DULoxetine  30 mg Oral Daily  . levothyroxine  100 mcg Oral Daily  . metoprolol tartrate  25 mg Oral BID  . multivitamin-lutein  1 capsule Oral Daily  . pantoprazole  40 mg Oral Daily  . ranolazine  500 mg Oral BID  . sodium chloride flush  10 mL Intravenous Q12H   Continuous Infusions:  PRN Meds: acetaminophen, nitroGLYCERIN, ondansetron (ZOFRAN) IV   Vital Signs    Vitals:   10/28/19 0447 10/28/19 0500 10/28/19 0714 10/28/19 1128  BP: 103/60  111/64 (!) 125/44  Pulse: 65  69 66  Resp: 20  16   Temp: 97.7 F (36.5 C)  98 F (36.7 C) 97.7 F (36.5 C)  TempSrc: Oral  Oral Oral  SpO2: 96%  97% 96%  Weight:  53.9 kg    Height:        Intake/Output Summary (Last 24 hours) at 10/28/2019 1429 Last data filed at 10/28/2019 0850 Gross per 24 hour  Intake 411.26 ml  Output 600 ml  Net -188.74 ml   Last 3 Weights 10/28/2019 10/27/2019 10/26/2019  Weight (lbs) 118 lb 14.4 oz 119 lb 14.4 oz 122 lb 9.6 oz  Weight (kg) 53.933 kg 54.386 kg 55.611 kg      Telemetry    Sinus rhythm- Personally Reviewed  ECG    No new tracing obtained  Physical Exam   GEN: No acute distress.   Neck: No JVD Cardiac: RRR, systolic murmur Respiratory: Clear to auscultation bilaterally. GI: Soft, nontender, non-distended  MS: No edema; No deformity. Neuro:  Nonfocal  Psych: Normal affect   Labs    High Sensitivity Troponin:   Recent Labs  Lab 10/25/19 1416 10/25/19 1626 10/25/19 2157 10/25/19 2253 10/26/19 0030  TROPONINIHS 25* 416* 5,580* 5,859* 6,728*       Chemistry Recent Labs  Lab 10/25/19 1416 10/26/19 0204 10/27/19 0444  NA 138 140 140  K 4.2 4.1 4.3  CL 103 104 102  CO2 24 29 30   GLUCOSE 217* 97 82  BUN 27* 22 21  CREATININE 0.85 0.78 0.76  CALCIUM 9.3 9.0 9.1  PROT 7.0 6.0* 6.4*  ALBUMIN 4.1 3.5 3.7  AST 27 35 30  ALT 16 15 16   ALKPHOS 60 47 48  BILITOT 0.8 0.8 0.7  GFRNONAA >60 >60 >60  GFRAA >60 >60 >60  ANIONGAP 11 7 8      Hematology Recent Labs  Lab 10/26/19 0204 10/27/19 0444 10/28/19 0557  WBC 9.9 7.5 6.3  RBC 3.67* 4.06 4.02  HGB 11.1* 12.3 12.1  HCT 33.4* 37.9 36.7  MCV 91.0 93.3 91.3  MCH 30.2 30.3 30.1  MCHC 33.2 32.5 33.0  RDW 13.2 13.1 13.1  PLT 225 238 230    BNPNo results for input(s): BNP, PROBNP in the last 168 hours.   DDimer No results for input(s): DDIMER in the last 168 hours.   Radiology    No results found.  Cardiac Studies   TTE (10/26/19): 1. Left ventricular ejection fraction, by estimation, is 60 to 65%.  The  left ventricle has normal function. The left ventricle has no regional  wall motion abnormalities. There is mild left ventricular hypertrophy.  Left ventricular diastolic parameters  are indeterminate.  2. Right ventricular systolic function is normal. The right ventricular  size is normal. There is normal pulmonary artery systolic pressure.  3. The mitral valve is abnormal. Mild mitral valve regurgitation. Mild  mitral stenosis.  4. The aortic valve is normal in structure. Aortic valve regurgitation is  not visualized. Mild to moderate aortic valve sclerosis/calcification is  present, without any evidence of aortic stenosis.  5. The inferior vena cava is normal in size with greater than 50%  respiratory variability, suggesting right atrial pressure of 3 mmHg.   Patient Profile     86 y.o. female history of hypertension, hyperlipidemia presenting with chest pain and diagnosed with NSTEMI.   Assessment & Plan    1.  NSTEMI -Declined left heart  cath -Currently asymptomatic -Status post 48 hours of heparin -Patient is allergic to aspirin. -Plavix, Lipitor, Lopressor. -Echocardiogram with normal ejection fraction, EF 60 to 65%.   No further cardiac testing planned at this time.  Patient is asymptomatic.  Patient can be discharged on current cardiac medications.  Close follow-up as outpatient.  Total encounter time 35 minutes  Greater than 50% was spent in counseling and coordination of care with the patient     Signed, Kate Sable, MD  10/28/2019, 2:29 PM

## 2019-11-11 ENCOUNTER — Ambulatory Visit (INDEPENDENT_AMBULATORY_CARE_PROVIDER_SITE_OTHER): Payer: Medicare HMO | Admitting: Physician Assistant

## 2019-11-11 ENCOUNTER — Encounter: Payer: Self-pay | Admitting: Physician Assistant

## 2019-11-11 ENCOUNTER — Other Ambulatory Visit: Payer: Self-pay

## 2019-11-11 VITALS — BP 92/88 | HR 77 | Ht 63.0 in | Wt 123.5 lb

## 2019-11-11 DIAGNOSIS — I214 Non-ST elevation (NSTEMI) myocardial infarction: Secondary | ICD-10-CM | POA: Diagnosis not present

## 2019-11-11 DIAGNOSIS — R000 Tachycardia, unspecified: Secondary | ICD-10-CM

## 2019-11-11 DIAGNOSIS — R5383 Other fatigue: Secondary | ICD-10-CM

## 2019-11-11 DIAGNOSIS — E039 Hypothyroidism, unspecified: Secondary | ICD-10-CM

## 2019-11-11 DIAGNOSIS — I252 Old myocardial infarction: Secondary | ICD-10-CM

## 2019-11-11 DIAGNOSIS — Z87898 Personal history of other specified conditions: Secondary | ICD-10-CM | POA: Diagnosis not present

## 2019-11-11 DIAGNOSIS — I10 Essential (primary) hypertension: Secondary | ICD-10-CM | POA: Diagnosis not present

## 2019-11-11 DIAGNOSIS — I340 Nonrheumatic mitral (valve) insufficiency: Secondary | ICD-10-CM

## 2019-11-11 DIAGNOSIS — I050 Rheumatic mitral stenosis: Secondary | ICD-10-CM

## 2019-11-11 DIAGNOSIS — E785 Hyperlipidemia, unspecified: Secondary | ICD-10-CM

## 2019-11-11 DIAGNOSIS — I779 Disorder of arteries and arterioles, unspecified: Secondary | ICD-10-CM

## 2019-11-11 MED ORDER — EZETIMIBE 10 MG PO TABS
10.0000 mg | ORAL_TABLET | Freq: Every day | ORAL | 3 refills | Status: AC
Start: 2019-11-11 — End: 2020-02-09

## 2019-11-11 NOTE — Progress Notes (Signed)
Office Visit    Patient Name: Michele [name] Date of Encounter: 11/11/2019  Primary Care Provider:  Kirk Ruths, MD Primary Cardiologist:  No primary care provider on file.  Chief Complaint    Chief Complaint  Patient presents with  . office visit    Hospital F/U; Meds reconciled with med list from [place].   86 year old female with history of mild carotid artery disease, non-ST elevation myocardial infarction, mild MR/MS, hyperlipidemia, hypertension, ASA allergy, and seen today for follow-up after recent admission.  Past Medical History    Past Medical History:  Diagnosis Date  . Anemia    yrs ago  . Arthritis   . Back pain   . Carotid bruit    right side more so than left;but per pt not enough to clean out  . Constipation    metamucil bid  . Diverticulosis    right  . GERD (gastroesophageal reflux disease)    related to some meds;but doesn't take any medications  . History of migraine    stopped with menopause  . Hyperlipidemia    takes crestor daily  . Hypertension    takes Lisinopril daily  . Hypothyroidism    takes Synthroid daily  . Internal hemorrhoids   . Joint pain   . Joint swelling   . Macular degeneration    dry  . Nocturia   . NSTEMI (non-ST elevated myocardial infarction) ([place]) 07/25/2019  . Numbness    left foot  . Osteopenia   . Phlebitis    hx of-42yrs ago right leg;takes Plavix daily but on hold for surgery  . PONV (postoperative nausea and vomiting)   . Urinary frequency   . Urinary urgency    Past Surgical History:  Procedure Laterality Date  . BREAST BIOPSY Right 12/1997   neg  . BREAST CYST ASPIRATION Right 06/21/1954   rt fna- milk duct-neg  . COLONOSCOPY    . DILATION AND CURETTAGE OF UTERUS    . OOPHORECTOMY Bilateral   . removal of breast cyst  1998  . removal of neck cyst  1996  . removal of tubes and ovaries      Allergies  Allergies  Allergen Reactions  . Aspirin Other (See Comments)     Patient collapsed, was rushed to the hospital and hospitalized for a week.  . Other Nausea And Vomiting and Other (See Comments)    AGENT:  anesthesia  . Scallops [Shellfish Allergy] Nausea And Vomiting    Headaches  . Sulfa Antibiotics Hives  . Docosahexaenoic Acid (Dha) Other (See Comments)    GI upset.  . Tramadol     Changes in mental status  . Codeine Nausea And Vomiting  . Morphine And Related Nausea And Vomiting    History of Present Illness    Michele [name] is a 86 y.o. female with PMH as above.  She was recently admitted to [facility] this month with chest discomfort and elevated troponin or non-ST elevation myocardial infarction.  In discussion with her and her daughter during cardiology consult, she expressed preference for conservative management with anticoagulation/medical therapy.  CTA of the chest to exclude PE made note of extensive 3 VD CAD with ongoing preference to defer catheterization.  Echo 10/26/2019 showed LVEF 60 to 65%, no regional wall motion abnormalities, mild LVH, indeterminate diastolic parameters, AV mild to moderate sclerosis without stenosis.  She was continued on IV heparin.  She was noted to have a history of aspirin allergy.  She  was discharged on Pavix, Lipitor, and Lopressor.  Ranolazine 500 mg twice daily was added for antianginal therapy.  It was noted that beta-blocker was not increased and long-acting nitrate not added in the setting of soft BP.  Recommendation was to monitor QT interval with ranolazine, given that QTC was borderline prolonged during admission.  Severely elevated LDL was noted on admission with recommendation to continue atorvastatin 40 mg daily.  Today, she returns to clinic and is doing well from a cardiac standpoint. She reports CP since discharge, described as pressure like and in the center of her chest. She reports that it may last minutes and usually will hange with position / alleviate with position, She also reports episodes  of racing HR at rest and with exertion. She reports that these are often associated with elevated BP. She denies palpitations, dyspnea, pnd, orthopnea, n, v, significant dizziness, syncope, edema, weight gain, or early satiety.  She does report feeling tired.  She has exercise in the wheelchair and walked with her walker but continues to note some fatigue.  No recent falls or presyncope/syncope. She denies any signs or symptoms of bleeding on Plavix 75 mg daily.  BP today 92/55 with discussion of current Lopressor and possibly decreasing this dose.  In addition, patient is uncertain of price of Ranexa and indicates desire to figure out this price before proceeding on this medication.  Home Medications    Prior to Admission medications   Medication Sig Start Date End Date Taking? Authorizing Provider  acetaminophen (TYLENOL) 500 MG tablet Take 500 mg by mouth every 4 (four) hours as needed for mild pain or fever.   Yes [provider]  alum & mag hydroxide-simeth (MAALOX PLUS) 400-400-40 MG/5ML suspension Take 30 mLs by mouth every 4 (four) hours as needed for indigestion.   Yes [provider]  atorvastatin (LIPITOR) 40 MG tablet Take 1 tablet (40 mg total) by mouth every evening. 10/28/19  Yes Black, Lezlie Octave, NP  bisacodyl (DULCOLAX) 10 MG suppository Place 10 mg rectally as needed for moderate constipation.   Yes [provider]  calcium carbonate (TUMS - DOSED IN MG ELEMENTAL CALCIUM) 500 MG chewable tablet Chew 2 tablets by mouth as needed for indigestion or heartburn.   Yes [provider]  clopidogrel (PLAVIX) 75 MG tablet Take 1 tablet (75 mg total) by mouth daily. 10/29/19  Yes Black, Lezlie Octave, NP  DULoxetine (CYMBALTA) 30 MG capsule Take 30 mg by mouth daily. 10/19/19  Yes [provider]  famotidine (PEPCID) 20 MG tablet Take 20 mg by mouth 2 (two) times daily. 08/13/19  Yes [provider]  KLOR-CON M10 10 MEQ tablet Take 10 mEq by mouth daily.  10/13/19  Yes [provider]  lidocaine (LMX) 4 % cream Apply 1 application topically as needed.   Yes [provider]  MegaRed Omega-3 Krill Oil 350 MG CAPS Take 1 tablet by mouth daily.   Yes [provider]  Methylcellulose, Laxative, (CITRUCEL) 500 MG TABS Take 1 tablet by mouth as needed.   Yes [provider]  metoprolol tartrate (LOPRESSOR) 25 MG tablet Take 1 tablet (25 mg total) by mouth 2 (two) times daily. 10/28/19  Yes Black, Lezlie Octave, NP  Multiple Vitamins-Minerals (PRESERVISION AREDS 2 PO) Take 1 capsule by mouth 2 (two) times daily.   Yes [provider]  nitroGLYCERIN (NITROSTAT) 0.4 MG SL tablet Place 0.4 mg under the tongue every 5 (five) minutes as needed for chest pain.  Yes [provider]  ondansetron (ZOFRAN) 4 MG tablet Take 4 mg by mouth every 6 (six) hours as needed for nausea or vomiting.   Yes [provider]  pantoprazole (PROTONIX) 40 MG tablet Take 40 mg by mouth daily. 10/09/19  Yes [provider]  ranolazine (RANEXA) 500 MG 12 hr tablet Take 1 tablet (500 mg total) by mouth 2 (two) times daily. 10/28/19  Yes Black, Lezlie Octave, NP  SYNTHROID 100 MCG tablet Take 100 mcg by mouth daily. 10/11/18  Yes [provider]  amoxicillin-clavulanate (AUGMENTIN) 875-125 MG tablet Take 1 tablet by mouth 2 (two) times daily. 10/23/19   [provider]  diclofenac Sodium (VOLTAREN) 1 % GEL Apply 2-4 g topically in the morning, at noon, in the evening, and at bedtime. 08/13/19   [provider]  ezetimibe (ZETIA) 10 MG tablet Take 1 tablet (10 mg total) by mouth daily. 11/11/19 02/09/20  Marrianne Mood D, PA-C  Krill Oil 300 MG CAPS Take 300 mg by mouth daily.    [provider]  predniSONE (DELTASONE) 50 MG tablet Take 50 mg by mouth daily. 10/23/19   [provider]    Review of Systems    She denies  palpitations, dyspnea, pnd, orthopnea, n, v, dizziness, syncope, edema,  weight gain, or early satiety. She reports episodes of CP and racing HR as above.   All other systems reviewed and are otherwise negative except as noted above.  Physical Exam    VS:  BP 92/88 (BP Location: Right Arm, Patient Position: Sitting, Cuff Size: Normal)   Pulse 77   Ht 5\' 3"  (1.6 m)   Wt 123 lb 8 oz (56 kg)   SpO2 98%   BMI 21.88 kg/m  , BMI Body mass index is 21.88 kg/m. GEN: Well nourished, well developed, in no acute distress.  Seated in wheelchair HEENT: normal. Neck: Supple, no JVD, bilateral carotid bruits, no masses. Cardiac: RRR, 1/6 systolic murmurs. No rubs, or gallops. No clubbing, cyanosis, edema.  Radials/DP/PT 2+ and equal bilaterally.  Respiratory:  Respirations regular and unlabored, clear to auscultation bilaterally. GI: Soft, nontender, nondistended, BS + x 4. MS: no deformity or atrophy. Skin: warm and dry, no rash. Neuro:  Strength and sensation are intact. Psych: Normal affect.  Accessory Clinical Findings    ECG personally reviewed by me today - NSR, 77 bpm. When compared to 10/25/2019 tracing: rate decreased 20 bpm, PR interval improved as no first-degree AV block / PR interval 170 ms, poor conduction in lead III suspect 2/2-lead placement, QTc 459- no acute changes.  VITALS Reviewed today   Temp Readings from Last 3 Encounters:  10/28/19 98.1 F (36.7 C)  12/27/18 97.9 F (36.6 C) (Oral)  10/22/18 97.7 F (36.5 C)   BP Readings from Last 3 Encounters:  11/11/19 92/88  10/28/19 (!) 116/53  12/27/18 (!) 167/76   Pulse Readings from Last 3 Encounters:  11/11/19 77  10/28/19 71  12/27/18 81    Wt Readings from Last 3 Encounters:  11/11/19 123 lb 8 oz (56 kg)  10/28/19 118 lb 14.4 oz (53.9 kg)  12/27/18 132 lb (59.9 kg)     LABS  reviewed today   Lab Results  Component Value Date   WBC 6.3 10/28/2019   HGB 12.1 10/28/2019   HCT 36.7 10/28/2019   MCV 91.3 10/28/2019   PLT 230 10/28/2019   Lab Results  Component Value Date    CREATININE 0.76 10/27/2019   BUN  21 10/27/2019   NA 140 10/27/2019   K 4.3 10/27/2019   CL 102 10/27/2019   CO2 30 10/27/2019   Lab Results  Component Value Date   ALT 16 10/27/2019   AST 30 10/27/2019   ALKPHOS 48 10/27/2019   BILITOT 0.7 10/27/2019   Lab Results  Component Value Date   CHOL 271 (H) 10/26/2019   HDL 54 10/26/2019   LDLCALC 197 (H) 10/26/2019   TRIG 101 10/26/2019   CHOLHDL 5.0 10/26/2019    Lab Results  Component Value Date   HGBA1C 5.8 (H) 10/26/2019   Lab Results  Component Value Date   TSH 0.294 (L) 10/26/2019     STUDIES/PROCEDURES reviewed today   TTE (10/26/19): 1. Left ventricular ejection fraction, by estimation, is 60 to 65%. The  left ventricle has normal function. The left ventricle has no regional  wall motion abnormalities. There is mild left ventricular hypertrophy.  Left ventricular diastolic parameters  are indeterminate.  2. Right ventricular systolic function is normal. The right ventricular  size is normal. There is normal pulmonary artery systolic pressure.  3. The mitral valve is abnormal. Mild mitral valve regurgitation. Mild  mitral stenosis.  4. The aortic valve is normal in structure. Aortic valve regurgitation is  not visualized. Mild to moderate aortic valve sclerosis/calcification is  present, without any evidence of aortic stenosis.  5. The inferior vena cava is normal in size with greater than 50%  respiratory variability, suggesting right atrial pressure of 3 mmHg.   Assessment & Plan    Recent NSTEMI Chest pain, Racing HR 3v CAD by CT ASA allergy --Reports ongoing episodes of CP and notes racing HR with exercising. Echo as above with nl LVEF and no RWMA. CTA did show extensive three-vessel coronary artery calcification.  She has declined catheterization during admission and is still uninterested in proceeding with any invasive procedures at this time.   --Continue current clopidogrel/ monotherapy, given  history of aspirin allergy.  Continue high intensity statin and low-dose metoprolol.  BP today soft; however, she is without significant symptoms.  Recommend continue to monitor with twice daily BP and heart rate checks.  On review of paperwork provided by living facility, it appears that her blood pressure and heart rate have been stable at her facility.  We will continue current beta-blocker at this time.  Continue ranolazine 500 mg twice daily for antianginal therapy.  Currently, we are attempting to figure out how much she is paying for this medication by calling [facility] and pharmacy number given during today's exam.  In addition, we will continue to monitor her QTC.  As previously noted, reluctance was expressed during admission to increase beta-blocker or long-acting nitrate in the setting of soft blood pressure.  Future considerations could include Imdur.  We will continue to monitor her QTc interval (somewhat improved today). Check CBC and BMET. Patient reports PCP stated he will follow up on thyroid functionl.   Hypertension --Recommend twice daily BP checks. BP today soft with pt asx and will continue current medications.  Hyperlipidemia --Severely elevated LDL 197.  Continue atorvastatin 40 mg daily.  Recommend repeat liver and lipid function in 6 to 8 weeks Add Zetia. Recheck at RTC.   ?Potassium supplementation --May not be needed. Check BMET. Recommendations at that time.   Mild AS --Periodic echo  Carotid dz --Continue to monitor sx   Arvil Chaco, PA-C 11/11/2019

## 2019-11-11 NOTE — Patient Instructions (Signed)
Medication Instructions:   Your physician has recommended you make the following change in your medication:   1.  START taking Zetia (Ezetimibe): Take 1 tablet (10 mg total) by mouth daily.  2.  HOLD your Potassium until we get lab results and call with further instructions.  3.  NOTIFY our office if you experience any chest pain, or if your blood pressure is 100/60 or below.  *If you need a refill on your cardiac medications before your next appointment, please call your pharmacy*   Lab Work:  Your physician recommends that you have Labs drawn today in the office.  If you have labs (blood work) drawn today and your tests are completely normal, you will receive your results only by: Marland Kitchen MyChart Message (if you have MyChart) OR . A paper copy in the mail If you have any lab test that is abnormal or we need to change your treatment, we will call you to review the results.   Testing/Procedures: None Ordered.   Follow-Up: At [facility], you and your health needs are our priority.  As part of our continuing mission to provide you with exceptional heart care, we have created designated Provider Care Teams.  These Care Teams include your primary Cardiologist (physician) and Advanced Practice Providers (APPs -  Physician Assistants and Nurse Practitioners) who all work together to provide you with the care you need, when you need it.  We recommend signing up for the patient portal called "MyChart".  Sign up information is provided on this After Visit Summary.  MyChart is used to connect with patients for Virtual Visits (Telemedicine).  Patients are able to view lab/test results, encounter notes, upcoming appointments, etc.  Non-urgent messages can be sent to your provider as well.   To learn more about what you can do with MyChart, go to [email or web address].    Your next appointment:   1   month(s)  The format for your next appointment:   In Person  Provider:    You may see No  primary care provider on file. or one of the following Advanced Practice Providers on your designated Care Team:    Murray Hodgkins, NP  Christell Faith, PA-C  Marrianne Mood, PA-C  Laurann Montana, NP    Other Instructions  N/A

## 2019-11-12 LAB — BASIC METABOLIC PANEL
BUN/Creatinine Ratio: 22 (ref 12–28)
BUN: 21 mg/dL (ref 10–36)
CO2: 24 mmol/L (ref 20–29)
Calcium: 8.9 mg/dL (ref 8.7–10.3)
Chloride: 105 mmol/L (ref 96–106)
Creatinine, Ser: 0.95 mg/dL (ref 0.57–1.00)
GFR calc Af Amer: 61 mL/min/{1.73_m2} (ref >59)
GFR calc non Af Amer: 53 mL/min/{1.73_m2} — ABNORMAL LOW (ref >59)
Glucose: 80 mg/dL (ref 65–99)
Potassium: 4.8 mmol/L (ref 3.5–5.2)
Sodium: 142 mmol/L (ref 134–144)

## 2019-11-12 LAB — CBC
Hematocrit: 36.3 % (ref 34.0–46.6)
Hemoglobin: 12.4 g/dL (ref 11.1–15.9)
MCH: 31.3 pg (ref 26.6–33.0)
MCHC: 34.2 g/dL (ref 31.5–35.7)
MCV: 92 fL (ref 79–97)
Platelets: 245 10*3/uL (ref 150–450)
RBC: 3.96 x10E6/uL (ref 3.77–5.28)
RDW: 12.7 % (ref 11.7–15.4)
WBC: 7.3 10*3/uL (ref 3.4–10.8)

## 2019-11-16 ENCOUNTER — Telehealth: Payer: Self-pay

## 2019-11-16 NOTE — Telephone Encounter (Signed)
-----   Message from Arvil Chaco, PA-C sent at 11/16/2019 11:03 AM EDT ----- Stable renal function and blood counts / hemoglobin. Reassuring.

## 2019-11-16 NOTE — Telephone Encounter (Signed)
Call to patient to review labs.    Left detailed message with results.    Advised pt to call for any further questions or concerns.  No further orders.  Left message with appt details for later this week.

## 2019-11-28 ENCOUNTER — Other Ambulatory Visit: Payer: Self-pay

## 2019-11-28 ENCOUNTER — Emergency Department: Payer: Medicare HMO

## 2019-11-28 DIAGNOSIS — I959 Hypotension, unspecified: Secondary | ICD-10-CM | POA: Diagnosis present

## 2019-11-28 DIAGNOSIS — N179 Acute kidney failure, unspecified: Secondary | ICD-10-CM | POA: Diagnosis not present

## 2019-11-28 DIAGNOSIS — I052 Rheumatic mitral stenosis with insufficiency: Secondary | ICD-10-CM | POA: Diagnosis present

## 2019-11-28 DIAGNOSIS — I200 Unstable angina: Secondary | ICD-10-CM

## 2019-11-28 DIAGNOSIS — Z6821 Body mass index (BMI) 21.0-21.9, adult: Secondary | ICD-10-CM | POA: Diagnosis not present

## 2019-11-28 DIAGNOSIS — Z7902 Long term (current) use of antithrombotics/antiplatelets: Secondary | ICD-10-CM

## 2019-11-28 DIAGNOSIS — E8770 Fluid overload, unspecified: Secondary | ICD-10-CM | POA: Diagnosis not present

## 2019-11-28 DIAGNOSIS — K5900 Constipation, unspecified: Secondary | ICD-10-CM | POA: Diagnosis present

## 2019-11-28 DIAGNOSIS — I340 Nonrheumatic mitral (valve) insufficiency: Secondary | ICD-10-CM | POA: Diagnosis not present

## 2019-11-28 DIAGNOSIS — Z79890 Hormone replacement therapy (postmenopausal): Secondary | ICD-10-CM | POA: Diagnosis not present

## 2019-11-28 DIAGNOSIS — I2584 Coronary atherosclerosis due to calcified coronary lesion: Secondary | ICD-10-CM | POA: Diagnosis present

## 2019-11-28 DIAGNOSIS — M19041 Primary osteoarthritis, right hand: Secondary | ICD-10-CM | POA: Diagnosis present

## 2019-11-28 DIAGNOSIS — Z79899 Other long term (current) drug therapy: Secondary | ICD-10-CM

## 2019-11-28 DIAGNOSIS — R634 Abnormal weight loss: Secondary | ICD-10-CM | POA: Diagnosis present

## 2019-11-28 DIAGNOSIS — Z803 Family history of malignant neoplasm of breast: Secondary | ICD-10-CM

## 2019-11-28 DIAGNOSIS — Z8672 Personal history of thrombophlebitis: Secondary | ICD-10-CM

## 2019-11-28 DIAGNOSIS — K219 Gastro-esophageal reflux disease without esophagitis: Secondary | ICD-10-CM | POA: Diagnosis present

## 2019-11-28 DIAGNOSIS — H3530 Unspecified macular degeneration: Secondary | ICD-10-CM | POA: Diagnosis present

## 2019-11-28 DIAGNOSIS — I25110 Atherosclerotic heart disease of native coronary artery with unstable angina pectoris: Secondary | ICD-10-CM | POA: Diagnosis present

## 2019-11-28 DIAGNOSIS — Z20822 Contact with and (suspected) exposure to covid-19: Secondary | ICD-10-CM | POA: Diagnosis present

## 2019-11-28 DIAGNOSIS — E039 Hypothyroidism, unspecified: Secondary | ICD-10-CM | POA: Diagnosis present

## 2019-11-28 DIAGNOSIS — Z886 Allergy status to analgesic agent status: Secondary | ICD-10-CM | POA: Diagnosis not present

## 2019-11-28 DIAGNOSIS — Z66 Do not resuscitate: Secondary | ICD-10-CM | POA: Diagnosis not present

## 2019-11-28 DIAGNOSIS — I10 Essential (primary) hypertension: Secondary | ICD-10-CM | POA: Diagnosis present

## 2019-11-28 DIAGNOSIS — I252 Old myocardial infarction: Secondary | ICD-10-CM

## 2019-11-28 DIAGNOSIS — E785 Hyperlipidemia, unspecified: Secondary | ICD-10-CM | POA: Diagnosis present

## 2019-11-28 DIAGNOSIS — I2510 Atherosclerotic heart disease of native coronary artery without angina pectoris: Secondary | ICD-10-CM | POA: Diagnosis not present

## 2019-11-28 DIAGNOSIS — I214 Non-ST elevation (NSTEMI) myocardial infarction: Secondary | ICD-10-CM | POA: Diagnosis present

## 2019-11-28 DIAGNOSIS — Z8249 Family history of ischemic heart disease and other diseases of the circulatory system: Secondary | ICD-10-CM

## 2019-11-28 DIAGNOSIS — R079 Chest pain, unspecified: Secondary | ICD-10-CM | POA: Diagnosis present

## 2019-11-28 DIAGNOSIS — Z515 Encounter for palliative care: Secondary | ICD-10-CM | POA: Diagnosis not present

## 2019-11-28 LAB — CBC WITH DIFFERENTIAL/PLATELET
Abs Immature Granulocytes: 0.02 10*3/uL (ref 0.00–0.07)
Basophils Absolute: 0.1 10*3/uL (ref 0.0–0.1)
Basophils Relative: 1 %
Eosinophils Absolute: 0.1 10*3/uL (ref 0.0–0.5)
Eosinophils Relative: 1 %
HCT: 35.7 % — ABNORMAL LOW (ref 36.0–46.0)
Hemoglobin: 11.8 g/dL — ABNORMAL LOW (ref 12.0–15.0)
Immature Granulocytes: 0 %
Lymphocytes Relative: 27 %
Lymphs Abs: 2.2 10*3/uL (ref 0.7–4.0)
MCH: 31.2 pg (ref 26.0–34.0)
MCHC: 33.1 g/dL (ref 30.0–36.0)
MCV: 94.4 fL (ref 80.0–100.0)
Monocytes Absolute: 1 10*3/uL (ref 0.1–1.0)
Monocytes Relative: 12 %
Neutro Abs: 4.8 10*3/uL (ref 1.7–7.7)
Neutrophils Relative %: 59 %
Platelets: 224 10*3/uL (ref 150–400)
RBC: 3.78 MIL/uL — ABNORMAL LOW (ref 3.87–5.11)
RDW: 13.3 % (ref 11.5–15.5)
WBC: 8.1 10*3/uL (ref 4.0–10.5)
nRBC: 0 % (ref 0.0–0.2)

## 2019-11-28 LAB — COMPREHENSIVE METABOLIC PANEL
ALT: 17 U/L (ref 0–44)
AST: 24 U/L (ref 15–41)
Albumin: 4.3 g/dL (ref 3.5–5.0)
Alkaline Phosphatase: 59 U/L (ref 38–126)
Anion gap: 9 (ref 5–15)
BUN: 26 mg/dL — ABNORMAL HIGH (ref 8–23)
CO2: 26 mmol/L (ref 22–32)
Calcium: 8.9 mg/dL (ref 8.9–10.3)
Chloride: 103 mmol/L (ref 98–111)
Creatinine, Ser: 1.05 mg/dL — ABNORMAL HIGH (ref 0.44–1.00)
GFR calc Af Amer: 54 mL/min — ABNORMAL LOW (ref >60)
GFR calc non Af Amer: 47 mL/min — ABNORMAL LOW (ref >60)
Glucose, Bld: 101 mg/dL — ABNORMAL HIGH (ref 70–99)
Potassium: 4.8 mmol/L (ref 3.5–5.1)
Sodium: 138 mmol/L (ref 135–145)
Total Bilirubin: 0.8 mg/dL (ref 0.3–1.2)
Total Protein: 7.3 g/dL (ref 6.5–8.1)

## 2019-11-28 LAB — PROTIME-INR
INR: 1 (ref 0.8–1.2)
Prothrombin Time: 12.5 seconds (ref 11.4–15.2)

## 2019-11-28 LAB — TROPONIN I (HIGH SENSITIVITY)
Troponin I (High Sensitivity): 314 ng/L (ref <18)
Troponin I (High Sensitivity): 263 ng/L (ref <18)
Troponin I (High Sensitivity): 258 ng/L (ref <18)

## 2019-11-28 LAB — SARS CORONAVIRUS 2 BY RT PCR (HOSPITAL ORDER, PERFORMED IN ~~LOC~~ HOSPITAL LAB): SARS Coronavirus 2: NEGATIVE

## 2019-11-28 LAB — BRAIN NATRIURETIC PEPTIDE: B Natriuretic Peptide: 462.7 pg/mL — ABNORMAL HIGH (ref 0.0–100.0)

## 2019-11-28 LAB — APTT: aPTT: 25 seconds (ref 24–36)

## 2019-11-28 LAB — TSH: TSH: 1.177 u[IU]/mL (ref 0.350–4.500)

## 2019-11-28 LAB — T4, FREE: Free T4: 1.23 ng/dL — ABNORMAL HIGH (ref 0.61–1.12)

## 2019-11-28 IMAGING — DX DG CHEST 1V PORT
1 series · 1 of 1 positions shown · non-contrast
Comparison: [DATE]

CLINICAL DATA: Chest pain

EXAM:
PORTABLE CHEST 1 VIEW

[chest ap]
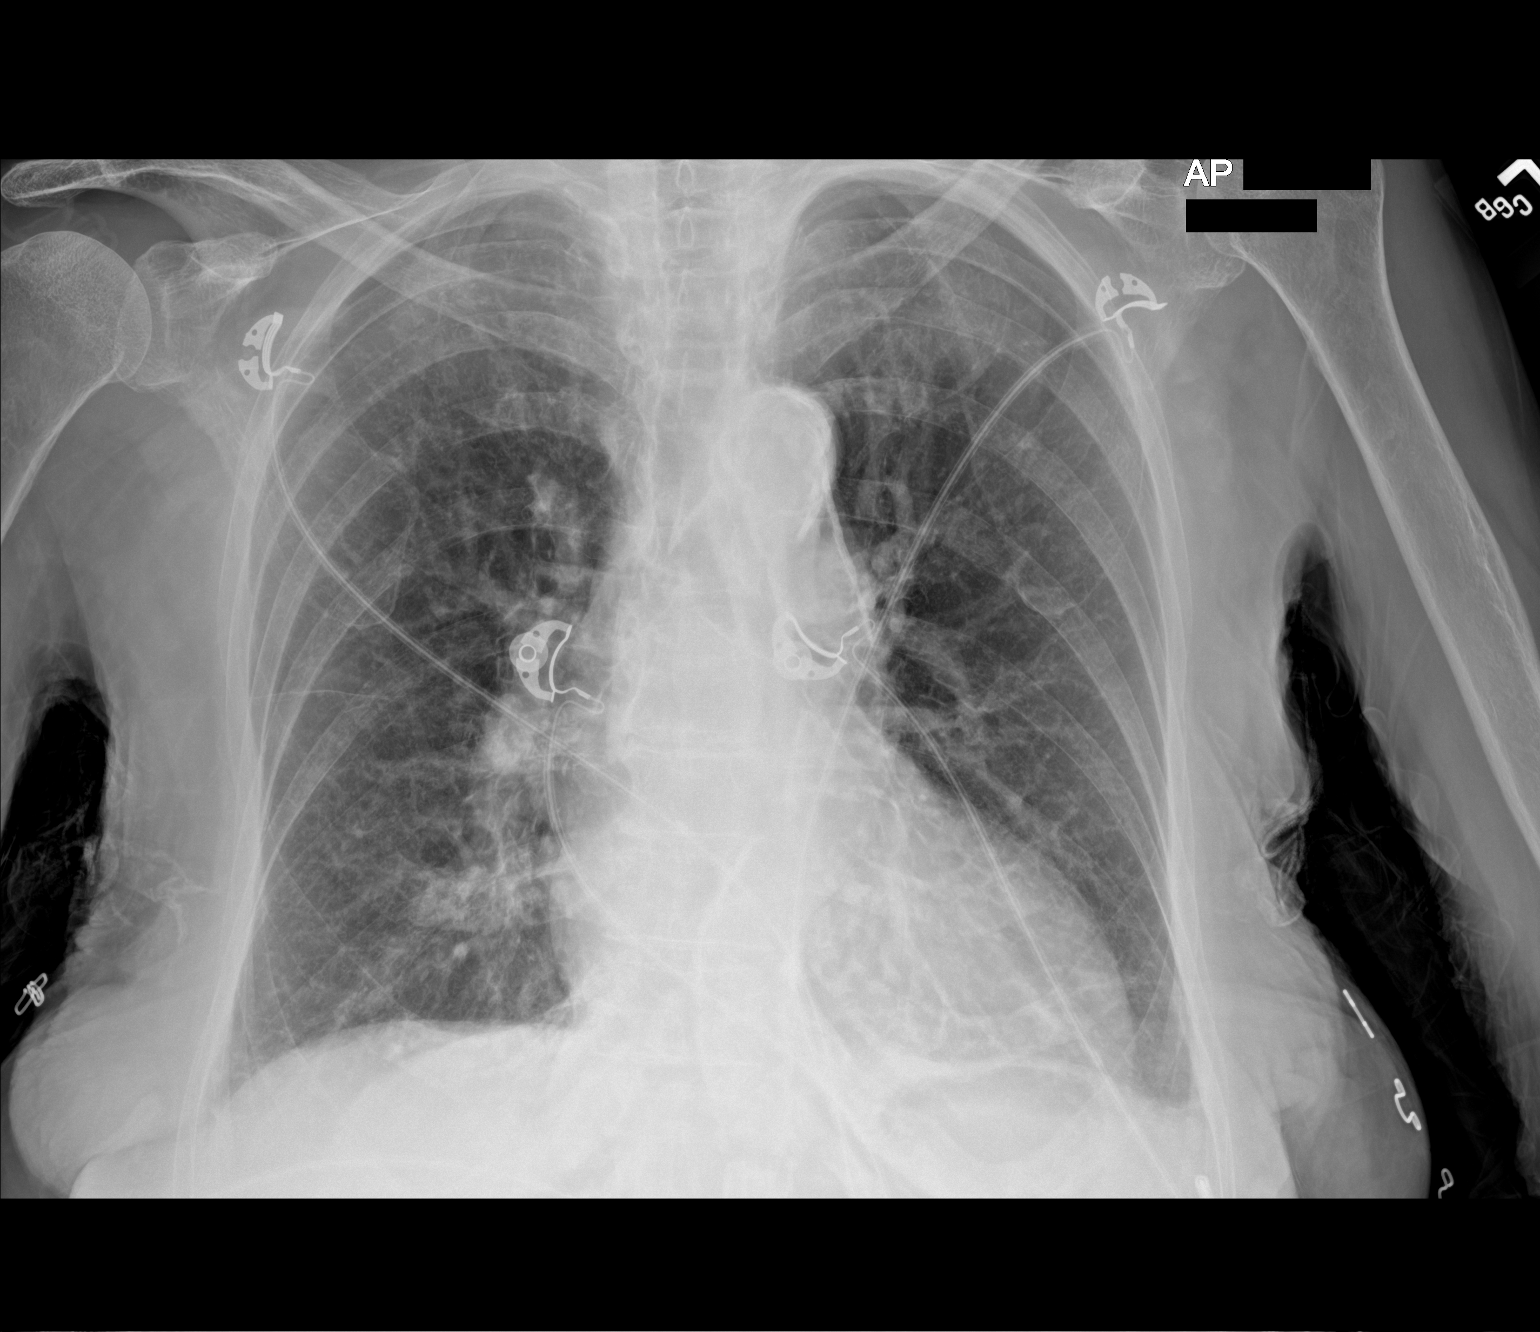

[1 of 1 positions shown; findings below may reference images not displayed]

FINDINGS: No edema or airspace opacity. Heart is borderline enlarged with
pulmonary vascularity normal. No adenopathy. There is aortic
atherosclerosis. No bone lesions.
IMPRESSION: No edema or airspace opacity. Heart borderline enlarged. Aortic
Atherosclerosis ([YU]-[YU]).

## 2019-11-28 MED ORDER — CALCIUM CARBONATE ANTACID 500 MG PO CHEW: 2.0000 | CHEWABLE_TABLET | ORAL | Status: DC | PRN

## 2019-11-28 MED ORDER — NITROGLYCERIN 0.4 MG SL SUBL
0.4000 mg | SUBLINGUAL_TABLET | SUBLINGUAL | Status: DC | PRN
  Filled 2019-11-28 (×2): qty 1

## 2019-11-28 MED ORDER — BISACODYL 10 MG RE SUPP
10.0000 mg | RECTAL | Status: DC | PRN
  Filled 2019-11-28: qty 1

## 2019-11-28 MED ORDER — ALUM & MAG HYDROXIDE-SIMETH 200-200-20 MG/5ML PO SUSP: 30.0000 mL | ORAL | Status: DC | PRN

## 2019-11-28 MED ORDER — LEVOTHYROXINE SODIUM 100 MCG PO TABS
100.0000 ug | ORAL_TABLET | Freq: Every day | ORAL | Status: DC
  Administered 2019-11-29 – 2019-12-01 (×3): 100 ug via ORAL
  Filled 2019-11-28 (×3): qty 1

## 2019-11-28 MED ORDER — ACETAMINOPHEN 325 MG PO TABS
650.0000 mg | ORAL_TABLET | ORAL | Status: DC | PRN
  Administered 2019-11-29 – 2019-12-01 (×5): 650 mg via ORAL
  Filled 2019-11-28 (×5): qty 2

## 2019-11-28 MED ORDER — FAMOTIDINE 20 MG PO TABS: 20.0000 mg | ORAL_TABLET | Freq: Two times a day (BID) | ORAL | Status: DC | PRN

## 2019-11-28 MED ORDER — CLOPIDOGREL BISULFATE 75 MG PO TABS
75.0000 mg | ORAL_TABLET | Freq: Every day | ORAL | Status: DC
  Administered 2019-11-28 – 2019-12-01 (×4): 75 mg via ORAL
  Filled 2019-11-28 (×4): qty 1

## 2019-11-28 MED ORDER — ONDANSETRON HCL 4 MG/2ML IJ SOLN
4.0000 mg | Freq: Four times a day (QID) | INTRAMUSCULAR | Status: DC | PRN
  Administered 2019-12-01: 4 mg via INTRAVENOUS
  Filled 2019-11-28: qty 2

## 2019-11-28 MED ORDER — PSYLLIUM 95 % PO PACK
1.0000 | PACK | Freq: Every day | ORAL | Status: DC | PRN
  Filled 2019-11-28: qty 1

## 2019-11-28 MED ORDER — EZETIMIBE 10 MG PO TABS
10.0000 mg | ORAL_TABLET | Freq: Every day | ORAL | Status: DC
  Administered 2019-11-29 – 2019-12-01 (×2): 10 mg via ORAL
  Filled 2019-11-28 (×2): qty 1

## 2019-11-28 MED ORDER — PANTOPRAZOLE SODIUM 40 MG PO TBEC
40.0000 mg | DELAYED_RELEASE_TABLET | Freq: Every day | ORAL | Status: DC
  Administered 2019-11-28 – 2019-12-01 (×3): 40 mg via ORAL
  Filled 2019-11-28 (×3): qty 1

## 2019-11-28 MED ORDER — ENOXAPARIN SODIUM 60 MG/0.6ML ~~LOC~~ SOLN
1.0000 mg/kg | Freq: Two times a day (BID) | SUBCUTANEOUS | Status: DC
  Administered 2019-11-28: 55 mg via SUBCUTANEOUS
  Filled 2019-11-28 (×2): qty 0.6

## 2019-11-28 MED ORDER — RANOLAZINE ER 500 MG PO TB12
500.0000 mg | ORAL_TABLET | Freq: Two times a day (BID) | ORAL | Status: DC
  Administered 2019-11-29 – 2019-12-01 (×5): 500 mg via ORAL
  Filled 2019-11-28 (×8): qty 1

## 2019-11-28 MED ORDER — POTASSIUM CHLORIDE CRYS ER 10 MEQ PO TBCR
10.0000 meq | EXTENDED_RELEASE_TABLET | Freq: Every day | ORAL | Status: DC
  Administered 2019-11-28 – 2019-12-01 (×4): 10 meq via ORAL
  Filled 2019-11-28 (×4): qty 1

## 2019-11-28 MED ORDER — PRESERVISION AREDS 2 PO CAPS: ORAL_CAPSULE | Freq: Two times a day (BID) | ORAL | Status: DC

## 2019-11-28 MED ORDER — METOPROLOL TARTRATE 25 MG PO TABS
25.0000 mg | ORAL_TABLET | Freq: Two times a day (BID) | ORAL | Status: DC
  Administered 2019-11-28 – 2019-11-30 (×4): 25 mg via ORAL
  Filled 2019-11-28 (×4): qty 1

## 2019-11-28 MED ORDER — ATORVASTATIN CALCIUM 20 MG PO TABS
40.0000 mg | ORAL_TABLET | Freq: Every evening | ORAL | Status: DC
  Administered 2019-11-28 – 2019-12-01 (×4): 40 mg via ORAL
  Filled 2019-11-28 (×4): qty 2

## 2019-11-28 NOTE — ED Notes (Signed)
Report from [employer]

## 2019-11-28 NOTE — ED Provider Notes (Signed)
[facility] Emergency Department Provider Note   ____________________________________________   First MD Initiated Contact with Patient 11/30/2019 1616     (approximate)  I have reviewed the triage vital signs and the nursing notes.   HISTORY  Chief Complaint Chest Pain    HPI Michele [name] is a 86 y.o. female with past medical history of hypertension, hyperlipidemia, and GERD who presents to the ED complaining of chest pain.  Patient states that she has been having intermittent episodes of pressure in the center of her chest since last night.  They can come on at any time, the first episode happened after she had eaten dinner but the additional episodes today were not associated with eating.  Symptoms last for 15 to 20 minutes and have been resolved with nitroglycerin.  She got nitroglycerin just prior to arrival and all chest pain has now resolved.  She has had some mild shortness of breath with these episodes, which is also resolved.  She denies any recent fevers, cough, pain or swelling in her legs.  She did feel nauseous and had one episode of vomiting last night.  She was recently admitted for NSTEMI last month but opted for medical management rather than catheterization at that time.        Past Medical History:  Diagnosis Date  . Anemia    yrs ago  . Arthritis   . Back pain   . Carotid bruit    right side more so than left;but per pt not enough to clean out  . Constipation    metamucil bid  . Diverticulosis    right  . GERD (gastroesophageal reflux disease)    related to some meds;but doesn't take any medications  . History of migraine    stopped with menopause  . Hyperlipidemia    takes crestor daily  . Hypertension    takes Lisinopril daily  . Hypothyroidism    takes Synthroid daily  . Internal hemorrhoids   . Joint pain   . Joint swelling   . Macular degeneration    dry  . Nocturia   . NSTEMI (non-ST elevated myocardial  infarction) ([place]) 07/25/2019  . Numbness    left foot  . Osteopenia   . Phlebitis    hx of-1yrs ago right leg;takes Plavix daily but on hold for surgery  . PONV (postoperative nausea and vomiting)   . Urinary frequency   . Urinary urgency     Patient Active Problem List   Diagnosis Date Noted  . Macular degeneration   . NSTEMI (non-ST elevated myocardial infarction) ([place]) 10/25/2019  . Essential hypertension 10/25/2019  . Hyperlipidemia 10/25/2019  . Hypothyroidism 10/25/2019  . Left leg swelling 10/25/2019  . Chest pain 09/10/2016    Past Surgical History:  Procedure Laterality Date  . BREAST BIOPSY Right 12/1997   neg  . BREAST CYST ASPIRATION Right 06/21/1954   rt fna- milk duct-neg  . COLONOSCOPY    . DILATION AND CURETTAGE OF UTERUS    . OOPHORECTOMY Bilateral   . removal of breast cyst  1998  . removal of neck cyst  1996  . removal of tubes and ovaries      Prior to Admission medications   Medication Sig Start Date End Date Taking? Authorizing Provider  acetaminophen (TYLENOL) 500 MG tablet Take 500 mg by mouth every 4 (four) hours as needed for mild pain or fever.   Yes [provider]  alum & mag hydroxide-simeth (MAALOX PLUS)  [id number] MG/5ML suspension Take 30 mLs by mouth every 4 (four) hours as needed for indigestion.   Yes [provider]  atorvastatin (LIPITOR) 40 MG tablet Take 1 tablet (40 mg total) by mouth every evening. 10/28/19  Yes Black, Lezlie Octave, NP  bisacodyl (DULCOLAX) 10 MG suppository Place 10 mg rectally as needed for moderate constipation.   Yes [provider]  calcium carbonate (TUMS - DOSED IN MG ELEMENTAL CALCIUM) 500 MG chewable tablet Chew 2 tablets by mouth every 2 (two) hours as needed for indigestion or heartburn.    Yes [provider]  clopidogrel (PLAVIX) 75 MG tablet Take 1 tablet (75 mg total) by mouth daily. 10/29/19  Yes Black, Lezlie Octave, NP  ezetimibe (ZETIA) 10 MG tablet Take 1 tablet (10 mg total)  by mouth daily. 11/11/19 02/09/20 Yes Visser, Jacquelyn D, PA-C  famotidine (PEPCID) 20 MG tablet Take 20 mg by mouth 2 (two) times daily as needed for heartburn.  08/13/19  Yes [provider]  KLOR-CON M10 10 MEQ tablet Take 10 mEq by mouth daily. 10/13/19  Yes [provider]  Javier Docker Oil 350 MG CAPS Take 350 mg by mouth daily.    Yes [provider]  Lidocaine 4 % PTCH Apply 1 patch topically daily as needed (hip pain).    Yes [provider]  MegaRed Omega-3 Krill Oil 350 MG CAPS Take 350 mg by mouth daily.    Yes [provider]  Methylcellulose, Laxative, (CITRUCEL) 500 MG TABS Take 500 mg by mouth daily as needed (constipation).    Yes [provider]  metoprolol tartrate (LOPRESSOR) 25 MG tablet Take 1 tablet (25 mg total) by mouth 2 (two) times daily. 10/28/19  Yes Black, Lezlie Octave, NP  Multiple Vitamins-Minerals (PRESERVISION AREDS 2 PO) Take 1 capsule by mouth 2 (two) times daily.   Yes [provider]  ondansetron (ZOFRAN) 4 MG tablet Take 4 mg by mouth every 6 (six) hours as needed for nausea or vomiting.   Yes [provider]  pantoprazole (PROTONIX) 40 MG tablet Take 40 mg by mouth daily. 10/09/19  Yes [provider]  ranolazine (RANEXA) 500 MG 12 hr tablet Take 1 tablet (500 mg total) by mouth 2 (two) times daily. 10/28/19  Yes Black, Lezlie Octave, NP  SYNTHROID 100 MCG tablet Take 100 mcg by mouth daily. 10/11/18  Yes [provider]  nitroGLYCERIN (NITROSTAT) 0.4 MG SL tablet Place 0.4 mg under the tongue every 5 (five) minutes as needed for chest pain.    [provider]    Allergies Aspirin, Other, Scallops [shellfish allergy], Sulfa antibiotics, Docosahexaenoic acid (dha), Tramadol, Codeine, and Morphine and related  Family History  Problem Relation Age of Onset  . Heart failure Mother   . Heart failure Father   . Cancer Sister   . Breast cancer Sister 38  . Cancer Brother   . Breast  cancer Cousin        3 pat cousins    Social History Social History   Tobacco Use  . Smoking status: Never Smoker  . Smokeless tobacco: Never Used  Vaping Use  . Vaping Use: Never used  Substance Use Topics  . Alcohol use: No  . Drug use: No    Review of Systems  Constitutional: No fever/chills Eyes: No visual changes. ENT: No sore throat. Cardiovascular: Positive for chest pain. Respiratory: Positive for shortness of breath. Gastrointestinal: No abdominal pain.  No nausea, no vomiting.  No diarrhea.  No constipation. Genitourinary: Negative for dysuria. Musculoskeletal: Negative for back pain. Skin: Negative for rash. Neurological: Negative for headaches, focal weakness or numbness.  ____________________________________________   PHYSICAL EXAM:  VITAL SIGNS: ED Triage Vitals [12/03/2019 1613]  Enc Vitals Group     BP      Pulse      Resp      Temp      Temp src      SpO2      Weight 123 lb 7.3 oz (56 kg)     Height 5\' 3"  (1.6 m)     Head Circumference      Peak Flow      Pain Score 0     Pain Loc      Pain Edu?      Excl. in [place]?     Constitutional: Alert and oriented. Eyes: Conjunctivae are normal. Head: Atraumatic. Nose: No congestion/rhinnorhea. Mouth/Throat: Mucous membranes are moist. Neck: Normal ROM Cardiovascular: Normal rate, regular rhythm. Grossly normal heart sounds.  2+ radial pulses bilaterally. Respiratory: Normal respiratory effort.  No retractions. Lungs CTAB. Gastrointestinal: Soft and nontender. No distention. Genitourinary: deferred Musculoskeletal: No lower extremity tenderness nor edema. Neurologic:  Normal speech and language. No gross focal neurologic deficits are appreciated. Skin:  Skin is warm, dry and intact. No rash noted. Psychiatric: Mood and affect are normal. Speech and behavior are normal.  ____________________________________________   LABS (all labs ordered are listed, but only abnormal results are  displayed)  Labs Reviewed  CBC WITH DIFFERENTIAL/PLATELET - Abnormal; Notable for the following components:      Result Value   RBC 3.78 (*)    Hemoglobin 11.8 (*)    HCT 35.7 (*)    All other components within normal limits  COMPREHENSIVE METABOLIC PANEL - Abnormal; Notable for the following components:   Glucose, Bld 101 (*)    BUN 26 (*)    Creatinine, Ser 1.05 (*)    GFR calc non Af Amer 47 (*)    GFR calc Af Amer 54 (*)    All other components within normal limits  TROPONIN I (HIGH SENSITIVITY) - Abnormal; Notable for the following components:   Troponin I (High Sensitivity) 314 (*)    All other components within normal limits  SARS CORONAVIRUS 2 BY RT PCR (HOSPITAL ORDER, [place] [facility])  TROPONIN I (HIGH SENSITIVITY)   ____________________________________________  EKG  ED ECG REPORT I, Blake Divine, the attending physician, personally viewed and interpreted this ECG.   Date: 11/25/2019  EKG Time: 16:22  Rate: 86  Rhythm: normal sinus rhythm  Axis: LAD  Intervals:first-degree A-V block   ST&T Change: Subtle ST depressions laterally   PROCEDURES  Procedure(s) performed (including Critical Care):  Procedures   ____________________________________________   INITIAL IMPRESSION / ASSESSMENT AND PLAN / ED COURSE       86 year old female with past medical history of hypertension, hyperlipidemia, and GERD presents to the ED with intermittent episodes of chest pain since last night.  She describes it as a pressure that was initially associated with eating, but has since had episodes that are not associated with meals.  She describes the pain as severe when present, although it has resolved after receiving a dose of nitroglycerin prior to arrival.  She was recently admitted for NSTEMI with markedly elevated troponins, but opted for medical management rather than heart catheterization at that time.  She is quite high risk for ongoing ACS with  these episodes, unfortunately describes  a severe allergy to aspirin.  We will check EKG and serial troponins as well as chest x-ray, anticipate admission.   EKG shows new mild ST depressions laterally, otherwise no acute changes.  Chest x-ray is negative for acute process.  Troponin is elevated to 314, however this is improving compared to 1 month ago when it was above 6000.  We will trend troponin but hold off on heparin for now given patient is chest pain-free.  Case was discussed with [facility] health cardiology, patient states she is amenable to catheterization and cardiology will evaluate her during her admission.  Case discussed with hospitalist for admission.      ____________________________________________   FINAL CLINICAL IMPRESSION(S) / ED DIAGNOSES  Final diagnoses:  Unstable angina [facility])     ED Discharge Orders    None       Note:  This document was prepared using Dragon voice recognition software and may include unintentional dictation errors.   Blake Divine, MD 12/10/2019 [phone]

## 2019-11-28 NOTE — Consult Note (Signed)
ANTICOAGULATION CONSULT NOTE - Initial Consult  Pharmacy Consult for Enoxaparin Dosing Indication: chest pain/ACS  Allergies  Allergen Reactions  . Aspirin Other (See Comments)    Patient collapsed, was rushed to the hospital and hospitalized for a week.  . Other Nausea And Vomiting and Other (See Comments)    AGENT:  anesthesia  . Scallops [Shellfish Allergy] Nausea And Vomiting    Headaches  . Sulfa Antibiotics Hives  . Docosahexaenoic Acid (Dha) Other (See Comments)    GI upset.  . Tramadol     Changes in mental status  . Codeine Nausea And Vomiting  . Morphine And Related Nausea And Vomiting    Patient Measurements: Height: 5\' 3"  (160 cm) Weight: 56 kg (123 lb 7.3 oz) IBW/kg (Calculated) : 52.4 Heparin Dosing Weight: 56 kg  Vital Signs: Temp: 98.2 F (36.8 C) (07/10 1624) Temp Source: Oral (07/10 1624) BP: 130/57 (07/10 2000) Pulse Rate: 80 (07/10 2000)  Labs: Recent Labs    11/27/2019 1628 12/03/2019 1803  HGB 11.8*  --   HCT 35.7*  --   PLT 224  --   CREATININE 1.05*  --   TROPONINIHS 314* 263*    Estimated Creatinine Clearance: 29.5 mL/min (A) (by C-G formula based on SCr of 1.05 mg/dL (H)).   Medical History: Past Medical History:  Diagnosis Date  . Anemia    yrs ago  . Arthritis   . Back pain   . Carotid bruit    right side more so than left;but per pt not enough to clean out  . Constipation    metamucil bid  . Diverticulosis    right  . GERD (gastroesophageal reflux disease)    related to some meds;but doesn't take any medications  . History of migraine    stopped with menopause  . Hyperlipidemia    takes crestor daily  . Hypertension    takes Lisinopril daily  . Hypothyroidism    takes Synthroid daily  . Internal hemorrhoids   . Joint pain   . Joint swelling   . Macular degeneration    dry  . Nocturia   . NSTEMI (non-ST elevated myocardial infarction) ([place]) 07/25/2019  . Numbness    left foot  . Osteopenia   . Phlebitis    hx  of-23yrs ago right leg;takes Plavix daily but on hold for surgery  . PONV (postoperative nausea and vomiting)   . Urinary frequency   . Urinary urgency     Medications:  (Not in a hospital admission)  Scheduled:  . atorvastatin  40 mg Oral QPM  . clopidogrel  75 mg Oral Daily  . enoxaparin (LOVENOX) injection  1 mg/kg Subcutaneous Q12H  . ezetimibe  10 mg Oral Daily  . levothyroxine  100 mcg Oral Daily  . metoprolol tartrate  25 mg Oral BID  . pantoprazole  40 mg Oral Daily  . potassium chloride  10 mEq Oral Daily  . PreserVision AREDS 2   Oral BID  . ranolazine  500 mg Oral BID   Infusions:   PRN: acetaminophen, alum & mag hydroxide-simeth, bisacodyl, calcium carbonate, famotidine, Methylcellulose (Laxative), nitroGLYCERIN, ondansetron (ZOFRAN) IV Anti-infectives (From admission, onward)   None      Assessment: Pharmacy has been consulted for Enoxaparin Dosing in 86yo patient presenting to the ED complaining of chest pain. Has been have intermittent episodes of pressure in the center of her chest since last night. She was recently admitted(last month) for NSTEMI but opted for medical management rather  than catheterization.  Patient has no prior history of anticoagulant use.   Goal of Therapy:  Monitor platelets by anticoagulation protocol: Yes   Plan:  Enoxaparin 55mg  Q12 hours(~1mg /kg per dose) CBC ordered daily to help monitor H&H and platelets  Jessi Jessop A Ikhlas Albo 12/07/2019,8:40 PM

## 2019-11-28 NOTE — ED Triage Notes (Signed)
Pt arrives via EMS after having chest pain that was resolved by 1 nitro- pt had 3 of these episodes since last night- pt not having pain right now- pt described it as a pressure in the middle of her chest

## 2019-11-29 DIAGNOSIS — K219 Gastro-esophageal reflux disease without esophagitis: Secondary | ICD-10-CM

## 2019-11-29 DIAGNOSIS — E039 Hypothyroidism, unspecified: Secondary | ICD-10-CM

## 2019-11-29 DIAGNOSIS — I214 Non-ST elevation (NSTEMI) myocardial infarction: Principal | ICD-10-CM

## 2019-11-29 LAB — TROPONIN I (HIGH SENSITIVITY)
Troponin I (High Sensitivity): 329 ng/L (ref <18)
Troponin I (High Sensitivity): 259 ng/L (ref <18)

## 2019-11-29 LAB — CBC
HCT: 34.9 % — ABNORMAL LOW (ref 36.0–46.0)
Hemoglobin: 11.9 g/dL — ABNORMAL LOW (ref 12.0–15.0)
MCH: 31.4 pg (ref 26.0–34.0)
MCHC: 34.1 g/dL (ref 30.0–36.0)
MCV: 92.1 fL (ref 80.0–100.0)
Platelets: 226 10*3/uL (ref 150–400)
RBC: 3.79 MIL/uL — ABNORMAL LOW (ref 3.87–5.11)
RDW: 13.2 % (ref 11.5–15.5)
WBC: 7.5 10*3/uL (ref 4.0–10.5)
nRBC: 0 % (ref 0.0–0.2)

## 2019-11-29 LAB — LIPID PANEL
Cholesterol: 137 mg/dL (ref 0–200)
HDL: 55 mg/dL (ref >40)
LDL Cholesterol: 62 mg/dL (ref 0–99)
Total CHOL/HDL Ratio: 2.5 RATIO
Triglycerides: 102 mg/dL (ref <150)
VLDL: 20 mg/dL (ref 0–40)

## 2019-11-29 LAB — BASIC METABOLIC PANEL
Anion gap: 6 (ref 5–15)
BUN: 20 mg/dL (ref 8–23)
CO2: 30 mmol/L (ref 22–32)
Calcium: 8.7 mg/dL — ABNORMAL LOW (ref 8.9–10.3)
Chloride: 105 mmol/L (ref 98–111)
Creatinine, Ser: 0.89 mg/dL (ref 0.44–1.00)
GFR calc Af Amer: >60 mL/min (ref >60)
GFR calc non Af Amer: 57 mL/min — ABNORMAL LOW (ref >60)
Glucose, Bld: 90 mg/dL (ref 70–99)
Potassium: 4.3 mmol/L (ref 3.5–5.1)
Sodium: 141 mmol/L (ref 135–145)

## 2019-11-29 LAB — PROTIME-INR
INR: 1 (ref 0.8–1.2)
Prothrombin Time: 13 seconds (ref 11.4–15.2)

## 2019-11-29 LAB — HEPARIN LEVEL (UNFRACTIONATED): Heparin Unfractionated: 0.38 IU/mL (ref 0.30–0.70)

## 2019-11-29 MED ORDER — SODIUM CHLORIDE 0.9% FLUSH: 3.0000 mL | INTRAVENOUS | Status: DC | PRN

## 2019-11-29 MED ORDER — SODIUM CHLORIDE 0.9 % WEIGHT BASED INFUSION
3.0000 mL/kg/h | INTRAVENOUS | Status: DC
  Administered 2019-11-30: 3 mL/kg/h via INTRAVENOUS

## 2019-11-29 MED ORDER — SODIUM CHLORIDE 0.9 % WEIGHT BASED INFUSION
1.0000 mL/kg/h | INTRAVENOUS | Status: DC
  Administered 2019-11-30: 1 mL/kg/h via INTRAVENOUS

## 2019-11-29 MED ORDER — SODIUM CHLORIDE 0.9% FLUSH
3.0000 mL | Freq: Two times a day (BID) | INTRAVENOUS | Status: DC
  Administered 2019-11-29 (×2): 3 mL via INTRAVENOUS

## 2019-11-29 MED ORDER — SODIUM CHLORIDE 0.9 % IV SOLN: 250.0000 mL | INTRAVENOUS | Status: DC | PRN

## 2019-11-29 MED ORDER — HEPARIN (PORCINE) 25000 UT/250ML-% IV SOLN
650.0000 [IU]/h | INTRAVENOUS | Status: DC
  Administered 2019-11-29: 650 [IU]/h via INTRAVENOUS
  Filled 2019-11-29: qty 250

## 2019-11-29 NOTE — Plan of Care (Signed)

## 2019-11-29 NOTE — Consult Note (Addendum)
Cardiology Consultation:   Patient ID: Michele [name] MRN: [id number]; DOB: 06/05/1927  Admit date: 12/03/2019 Date of Consult: 11/29/2019  Primary Care Provider: Kirk Ruths, MD [place] Cardiologist: No primary care provider on file. [place] Electrophysiologist:  None   Patient Profile:   Michele [name] is a 86 y.o. female with a history of carotid artery disease, recent NSTEMI, HLD, HTN, mild MR/MS who is being seen today for the evaluation of chest pain and troponin elevation  at the request of Dr. Earleen Newport.   History of Present Illness:    Michele [name] is a 86 y.o. female with a history of carotid artery disease, recent NSTEMI, HLD, HTN, mild MR/MS who is being seen today for the evaluation of chest pain and troponin elevation  at the request of Dr. Earleen Newport. She presented to [facility] ED on 12/12/2019 with chest pain. She had been admitted to [facility] last month with a NSTEMI but elected to avoid an invasive cardiac workup. Chest CTA with extensive three vessel CAD. Echo  10/26/2019 showed LVEF 60-65%, no regional wall motion abnormalities, mild LVH, indeterminate diastolic parameters, AV mild to moderate sclerosis without stenosis. She was noted to have a history of aspirin allergy.  She was discharged on Pavix, Lipitor, and Lopressor.  Ranolazine 500 mg twice daily was added for antianginal therapy. She was seen in our office 11/11/19 and was doing well.   She now reports recurrent chest pain and she would be agreeable to a cardiac cath. ASA allergy noted. She notes several episodes of severe chest pain at home yesterday. No chest pain this am. She has dyspnea when walking to the bathroom today. EKG with sinus subtle anterolateral ST depression. Troponin flat in the 300s but was more significantly elevated one month ago when she was discharged.     Past Medical History:  Diagnosis Date  . Anemia    yrs ago  . Arthritis   . Back pain   .  Carotid bruit    right side more so than left;but per pt not enough to clean out  . Constipation    metamucil bid  . Diverticulosis    right  . GERD (gastroesophageal reflux disease)    related to some meds;but doesn't take any medications  . History of migraine    stopped with menopause  . Hyperlipidemia    takes crestor daily  . Hypertension    takes Lisinopril daily  . Hypothyroidism    takes Synthroid daily  . Internal hemorrhoids   . Joint pain   . Joint swelling   . Macular degeneration    dry  . Nocturia   . NSTEMI (non-ST elevated myocardial infarction) ([place]) 07/25/2019  . Numbness    left foot  . Osteopenia   . Phlebitis    hx of-70yrs ago right leg;takes Plavix daily but on hold for surgery  . PONV (postoperative nausea and vomiting)   . Urinary frequency   . Urinary urgency     Past Surgical History:  Procedure Laterality Date  . BREAST BIOPSY Right 12/1997   neg  . BREAST CYST ASPIRATION Right 06/21/1954   rt fna- milk duct-neg  . COLONOSCOPY    . DILATION AND CURETTAGE OF UTERUS    . OOPHORECTOMY Bilateral   . removal of breast cyst  1998  . removal of neck cyst  1996  . removal of tubes and ovaries       Home Medications:  Prior to Admission  medications   Medication Sig Start Date End Date Taking? Authorizing Provider  acetaminophen (TYLENOL) 500 MG tablet Take 500 mg by mouth every 4 (four) hours as needed for mild pain or fever.   Yes [provider]  alum & mag hydroxide-simeth (MAALOX PLUS) 400-400-40 MG/5ML suspension Take 30 mLs by mouth every 4 (four) hours as needed for indigestion.   Yes [provider]  atorvastatin (LIPITOR) 40 MG tablet Take 1 tablet (40 mg total) by mouth every evening. 10/28/19  Yes Black, Lezlie Octave, NP  bisacodyl (DULCOLAX) 10 MG suppository Place 10 mg rectally as needed for moderate constipation.   Yes [provider]  calcium carbonate (TUMS - DOSED IN MG ELEMENTAL CALCIUM) 500 MG chewable tablet  Chew 2 tablets by mouth every 2 (two) hours as needed for indigestion or heartburn.    Yes [provider]  clopidogrel (PLAVIX) 75 MG tablet Take 1 tablet (75 mg total) by mouth daily. 10/29/19  Yes Black, Lezlie Octave, NP  ezetimibe (ZETIA) 10 MG tablet Take 1 tablet (10 mg total) by mouth daily. 11/11/19 02/09/20 Yes Visser, Jacquelyn D, PA-C  famotidine (PEPCID) 20 MG tablet Take 20 mg by mouth 2 (two) times daily as needed for heartburn.  08/13/19  Yes [provider]  KLOR-CON M10 10 MEQ tablet Take 10 mEq by mouth daily. 10/13/19  Yes [provider]  Javier Docker Oil 350 MG CAPS Take 350 mg by mouth daily.    Yes [provider]  Lidocaine 4 % PTCH Apply 1 patch topically daily as needed (hip pain).    Yes [provider]  MegaRed Omega-3 Krill Oil 350 MG CAPS Take 350 mg by mouth daily.    Yes [provider]  Methylcellulose, Laxative, (CITRUCEL) 500 MG TABS Take 500 mg by mouth daily as needed (constipation).    Yes [provider]  metoprolol tartrate (LOPRESSOR) 25 MG tablet Take 1 tablet (25 mg total) by mouth 2 (two) times daily. 10/28/19  Yes Black, Lezlie Octave, NP  Multiple Vitamins-Minerals (PRESERVISION AREDS 2 PO) Take 1 capsule by mouth 2 (two) times daily.   Yes [provider]  ondansetron (ZOFRAN) 4 MG tablet Take 4 mg by mouth every 6 (six) hours as needed for nausea or vomiting.   Yes [provider]  pantoprazole (PROTONIX) 40 MG tablet Take 40 mg by mouth daily. 10/09/19  Yes [provider]  ranolazine (RANEXA) 500 MG 12 hr tablet Take 1 tablet (500 mg total) by mouth 2 (two) times daily. 10/28/19  Yes Black, Lezlie Octave, NP  SYNTHROID 100 MCG tablet Take 100 mcg by mouth daily. 10/11/18  Yes [provider]  nitroGLYCERIN (NITROSTAT) 0.4 MG SL tablet Place 0.4 mg under the tongue every 5 (five) minutes as needed for chest pain.    [provider]    Inpatient Medications: Scheduled Meds: .  atorvastatin  40 mg Oral QPM  . clopidogrel  75 mg Oral Daily  . enoxaparin (LOVENOX) injection  1 mg/kg Subcutaneous Q12H  . ezetimibe  10 mg Oral Daily  . levothyroxine  100 mcg Oral Daily  . metoprolol tartrate  25 mg Oral BID  . pantoprazole  40 mg Oral Daily  . potassium chloride  10 mEq Oral Daily  . ranolazine  500 mg Oral BID   Continuous Infusions:  PRN Meds: acetaminophen, alum & mag hydroxide-simeth, bisacodyl, calcium carbonate, famotidine, Methylcellulose (Laxative), nitroGLYCERIN, ondansetron (ZOFRAN) IV  Allergies:    Allergies  Allergen  Reactions  . Aspirin Other (See Comments)    Patient collapsed, was rushed to the hospital and hospitalized for a week.  . Other Nausea And Vomiting and Other (See Comments)    AGENT:  anesthesia  . Scallops [Shellfish Allergy] Nausea And Vomiting    Headaches  . Sulfa Antibiotics Hives  . Docosahexaenoic Acid (Dha) Other (See Comments)    GI upset.  . Tramadol     Changes in mental status  . Codeine Nausea And Vomiting  . Morphine And Related Nausea And Vomiting    Social History:   Social History   Socioeconomic History  . Marital status: Married    Spouse name: Not on file  . Number of children: Not on file  . Years of education: Not on file  . Highest education level: Not on file  Occupational History  . Not on file  Tobacco Use  . Smoking status: Never Smoker  . Smokeless tobacco: Never Used  Vaping Use  . Vaping Use: Never used  Substance and Sexual Activity  . Alcohol use: No  . Drug use: No  . Sexual activity: Never  Other Topics Concern  . Not on file  Social History Narrative  . Not on file   Social Determinants of Health   Financial Resource Strain:   . Difficulty of Paying Living Expenses:   Food Insecurity:   . Worried About Charity fundraiser in the Last Year:   . Arboriculturist in the Last Year:   Transportation Needs:   . Film/video editor (Medical):   Marland Kitchen Lack of Transportation  (Non-Medical):   Physical Activity:   . Days of Exercise per Week:   . Minutes of Exercise per Session:   Stress:   . Feeling of Stress :   Social Connections:   . Frequency of Communication with Friends and Family:   . Frequency of Social Gatherings with Friends and Family:   . Attends Religious Services:   . Active Member of Clubs or Organizations:   . Attends Archivist Meetings:   Marland Kitchen Marital Status:   Intimate Partner Violence:   . Fear of Current or Ex-Partner:   . Emotionally Abused:   Marland Kitchen Physically Abused:   . Sexually Abused:     Family History:   Family History  Problem Relation Age of Onset  . Heart failure Mother   . Heart failure Father   . Cancer Sister   . Breast cancer Sister 22  . Cancer Brother   . Breast cancer Cousin        3 pat cousins     ROS:  Please see the history of present illness.  All other ROS reviewed and negative.     Physical Exam/Data:   Vitals:   11/29/19 0036 11/29/19 0111 11/29/19 0115 11/29/19 0448  BP: (!) 121/48  140/68 (!) 100/58  Pulse: 69  73 74  Resp: 14  18 18   Temp:   98.1 F (36.7 C) (!) 97.4 F (36.3 C)  TempSrc:   Oral Oral  SpO2: 97%  100% 96%  Weight:  56.4 kg  56 kg  Height:  5\' 3"  (1.6 m)      Intake/Output Summary (Last 24 hours) at 11/29/2019 0749 Last data filed at 11/29/2019 0448 Gross per 24 hour  Intake 236 ml  Output 500 ml  Net -264 ml   Last 3 Weights 11/29/2019 11/29/2019 12/07/2019  Weight (lbs) 123 lb 7.3 oz 124 lb  6.4 oz 123 lb 7.3 oz  Weight (kg) 56 kg 56.427 kg 56 kg     Body mass index is 21.87 kg/m.  General:  Well nourished, well developed, in no acute distress HEENT: normal Lymph: no adenopathy Neck: no JVD Vascular: No carotid bruits Cardiac:  normal S1, S2; RRR; soft systolic murmur  Lungs:  clear to auscultation bilaterally, no wheezing, rhonchi or rales  Abd: soft, nontender, no hepatomegaly  Ext: no edema Musculoskeletal:  No deformities, BUE and BLE strength normal  and equal Skin: warm and dry  Neuro:  CNs 2-12 intact, no focal abnormalities noted Psych:  Normal affect   EKG:  The EKG was personally reviewed and demonstrates:  Sinus with subtle anterolateral ST depression Telemetry:  Telemetry was personally reviewed and demonstrates:  sinus  Relevant CV Studies:  Echo 10/26/19:  1. Left ventricular ejection fraction, by estimation, is 60 to 65%. The  left ventricle has normal function. The left ventricle has no regional  wall motion abnormalities. There is mild left ventricular hypertrophy.  Left ventricular diastolic parameters  are indeterminate.  2. Right ventricular systolic function is normal. The right ventricular  size is normal. There is normal pulmonary artery systolic pressure.  3. The mitral valve is abnormal. Mild mitral valve regurgitation. Mild  mitral stenosis.  4. The aortic valve is normal in structure. Aortic valve regurgitation is  not visualized. Mild to moderate aortic valve sclerosis/calcification is  present, without any evidence of aortic stenosis.  5. The inferior vena cava is normal in size with greater than 50%  respiratory variability, suggesting right atrial pressure of 3 mmHg.   Laboratory Data:  High Sensitivity Troponin:   Recent Labs  Lab 11/23/2019 1628 12/15/2019 1803 11/29/2019 2025 11/29/19 0253  TROPONINIHS 314* 263* 258* 329*     Chemistry Recent Labs  Lab 12/16/2019 1628 11/29/19 0253  NA 138 141  K 4.8 4.3  CL 103 105  CO2 26 30  GLUCOSE 101* 90  BUN 26* 20  CREATININE 1.05* 0.89  CALCIUM 8.9 8.7*  GFRNONAA 47* 57*  GFRAA 54* >60  ANIONGAP 9 6    Recent Labs  Lab 12/07/2019 1628  PROT 7.3  ALBUMIN 4.3  AST 24  ALT 17  ALKPHOS 59  BILITOT 0.8   Hematology Recent Labs  Lab 11/29/2019 1628 11/29/19 0253  WBC 8.1 7.5  RBC 3.78* 3.79*  HGB 11.8* 11.9*  HCT 35.7* 34.9*  MCV 94.4 92.1  MCH 31.2 31.4  MCHC 33.1 34.1  RDW 13.3 13.2  PLT 224 226   BNP Recent Labs  Lab  12/18/2019 2026  BNP 462.7*    DDimer No results for input(s): DDIMER in the last 168 hours.   Radiology/Studies:  DG Chest Portable 1 View  Result Date: 12/10/2019 CLINICAL DATA:  Chest pain EXAM: PORTABLE CHEST 1 VIEW COMPARISON:  October 25, 2019 FINDINGS: No edema or airspace opacity. Heart is borderline enlarged with pulmonary vascularity normal. No adenopathy. There is aortic atherosclerosis. No bone lesions. IMPRESSION: No edema or airspace opacity. Heart borderline enlarged. Aortic Atherosclerosis (ICD10-I70.0). Electronically Signed   By: Lowella Grip III M.D.   On: 12/05/2019 16:47   {  Assessment and Plan:   1. CAD/NSTEMI: She has no chest pain this am but given her presentation last month and recurrent chest pain, I think a cardiac cath is reasonable. She agrees to proceed. Will plan cardiac cath tomorrow. Timing to be decided.  Continue Plavix, statin, beta blocker and Ranexa. Note  ASA allergy D/C Lovenox and start a heparin drip so this can be turned off just before her cath.  NTG as needed.     For questions or updates, please contact [place] Please consult [email or web address] for contact info under    Signed, Lauree Chandler, MD  11/29/2019 7:49 AM

## 2019-11-29 NOTE — Consult Note (Signed)
[place] for a heparin infusion Indication: chest pain/ACS  Patient Measurements: Height: 5\' 3"  (160 cm) Weight: 56 kg (123 lb 7.3 oz) IBW/kg (Calculated) : 52.4 Heparin Dosing Weight: 56 kg  Vital Signs: Temp: 97.6 F (36.4 C) (07/11 1636) Temp Source: Oral (07/11 1121) BP: 102/54 (07/11 1636) Pulse Rate: 73 (07/11 1636)  Labs: Recent Labs    12/19/2019 1628 12/15/2019 1803 12/15/2019 2025 11/29/19 0253 11/29/19 0942 11/29/19 1840  HGB 11.8*  --   --  11.9*  --   --   HCT 35.7*  --   --  34.9*  --   --   PLT 224  --   --  226  --   --   APTT  --   --  25  --   --   --   LABPROT  --   --  12.5 13.0  --   --   INR  --   --  1.0 1.0  --   --   HEPARINUNFRC  --   --   --   --   --  0.38  CREATININE 1.05*  --   --  0.89  --   --   TROPONINIHS 314*   < > 258* 329* 259*  --    < > = values in this interval not displayed.    Estimated Creatinine Clearance: 34.8 mL/min (by C-G formula based on SCr of 0.89 mg/dL).   Medical History: Past Medical History:  Diagnosis Date  . Anemia    yrs ago  . Arthritis   . Back pain   . Carotid bruit    right side more so than left;but per pt not enough to clean out  . Constipation    metamucil bid  . Diverticulosis    right  . GERD (gastroesophageal reflux disease)    related to some meds;but doesn't take any medications  . History of migraine    stopped with menopause  . Hyperlipidemia    takes crestor daily  . Hypertension    takes Lisinopril daily  . Hypothyroidism    takes Synthroid daily  . Internal hemorrhoids   . Joint pain   . Joint swelling   . Macular degeneration    dry  . Nocturia   . NSTEMI (non-ST elevated myocardial infarction) ([place]) 07/25/2019  . Numbness    left foot  . Osteopenia   . Phlebitis    hx of-19yrs ago right leg;takes Plavix daily but on hold for surgery  . PONV (postoperative nausea and vomiting)   . Urinary frequency   . Urinary urgency      Medications:  Medications Prior to Admission  Medication Sig Dispense Refill Last Dose  . acetaminophen (TYLENOL) 500 MG tablet Take 500 mg by mouth every 4 (four) hours as needed for mild pain or fever.   12/03/2019 at PRN  . alum & mag hydroxide-simeth (MAALOX PLUS) 400-400-40 MG/5ML suspension Take 30 mLs by mouth every 4 (four) hours as needed for indigestion.   Unknown at PRN  . atorvastatin (LIPITOR) 40 MG tablet Take 1 tablet (40 mg total) by mouth every evening. 30 tablet 0 11/27/2019 at [place]  . bisacodyl (DULCOLAX) 10 MG suppository Place 10 mg rectally as needed for moderate constipation.   6+ months at PRN  . calcium carbonate (TUMS - DOSED IN MG ELEMENTAL CALCIUM) 500 MG chewable tablet Chew 2 tablets by mouth every 2 (two) hours as needed for  indigestion or heartburn.    Unknown at PRN  . clopidogrel (PLAVIX) 75 MG tablet Take 1 tablet (75 mg total) by mouth daily. 30 tablet 0 11/21/2019 at 0730  . ezetimibe (ZETIA) 10 MG tablet Take 1 tablet (10 mg total) by mouth daily. 90 tablet 3 12/10/2019 at Unknown time  . famotidine (PEPCID) 20 MG tablet Take 20 mg by mouth 2 (two) times daily as needed for heartburn.    11/23/2019 at PRN  . KLOR-CON M10 10 MEQ tablet Take 10 mEq by mouth daily.   12/13/2019 at 0730  . Krill Oil 350 MG CAPS Take 350 mg by mouth daily.    12/14/2019 at 0730  . Lidocaine 4 % PTCH Apply 1 patch topically daily as needed (hip pain).    Unknown at PRN  . MegaRed Omega-3 Krill Oil 350 MG CAPS Take 350 mg by mouth daily.      . Methylcellulose, Laxative, (CITRUCEL) 500 MG TABS Take 500 mg by mouth daily as needed (constipation).    Unknown at PRN  . metoprolol tartrate (LOPRESSOR) 25 MG tablet Take 1 tablet (25 mg total) by mouth 2 (two) times daily. 60 tablet 0 11/27/2019 at 0745  . Multiple Vitamins-Minerals (PRESERVISION AREDS 2 PO) Take 1 capsule by mouth 2 (two) times daily.     . ondansetron (ZOFRAN) 4 MG tablet Take 4 mg by mouth every 6 (six) hours as needed for  nausea or vomiting.   Unknown at PRN  . pantoprazole (PROTONIX) 40 MG tablet Take 40 mg by mouth daily.   12/03/2019 at 0615  . ranolazine (RANEXA) 500 MG 12 hr tablet Take 1 tablet (500 mg total) by mouth 2 (two) times daily. 60 tablet 0 11/26/2019 at 0730  . SYNTHROID 100 MCG tablet Take 100 mcg by mouth daily.   12/09/2019 at 0615  . nitroGLYCERIN (NITROSTAT) 0.4 MG SL tablet Place 0.4 mg under the tongue every 5 (five) minutes as needed for chest pain.   Unknown at PRN   Scheduled:  . atorvastatin  40 mg Oral QPM  . clopidogrel  75 mg Oral Daily  . ezetimibe  10 mg Oral Daily  . levothyroxine  100 mcg Oral Daily  . metoprolol tartrate  25 mg Oral BID  . pantoprazole  40 mg Oral Daily  . potassium chloride  10 mEq Oral Daily  . ranolazine  500 mg Oral BID  . sodium chloride flush  3 mL Intravenous Q12H   Infusions:  . sodium chloride    . [START ON 11/26/2019] sodium chloride     Followed by  . [START ON 12/09/2019] sodium chloride    . heparin 650 Units/hr (11/29/19 1101)   PRN: sodium chloride, acetaminophen, alum & mag hydroxide-simeth, bisacodyl, calcium carbonate, famotidine, nitroGLYCERIN, ondansetron (ZOFRAN) IV, psyllium, sodium chloride flush Anti-infectives (From admission, onward)   None      Assessment: 86 y.o. female with a history of carotid artery disease, recent NSTEMI, HLD, HTN, mild MR/MS who was seen for the evaluation of chest pain and troponin elevation. Pharmacy was consulted for starting a heparin infusion. She was recently admitted(last month) for NSTEMI but opted for medical management rather than catheterization.  Patient has no prior history of anticoagulant use. She was originally started with enoxaparin on admission and is now being transitioned to heparin. Her last dose of enoxaparin was 12/03/2019 at 2049 (55 mg). She was on heparin during her previous admission in June and that information will be used to guide  the initial infusion rate.  07/11 1840 HL  0.38: no change  Goal of Therapy:  Anti-Xa 0.3 - 0.7 IU/mL Monitor platelets by anticoagulation protocol: Yes   Plan:   Therapeutic x 1, will continue current rate of 650 units/hr  Recheck confirmatory Anti-Xa in 8 hours    CBC in am  [facility] A Lezli Danek 11/29/2019,7:18 PM

## 2019-11-29 NOTE — Progress Notes (Signed)
Patient ID: Michele [name], female   DOB: 24-Aug-1927, 86 y.o.   MRN: [id number] [facility] PROGRESS NOTE  Michele [name] AYT:[id number] DOB: 12-Jun-1927 DOA: 11/21/2019 PCP: Kirk Ruths, MD  HPI/Subjective: Patient came in with chest pain going on 2 days in a row.  Second time happened while she was playing Scrabble.  Patient states that she gets upset if she cannot beat her prior score.  Objective: Vitals:   11/29/19 1101 11/29/19 1121  BP:  137/64  Pulse:  70  Resp: 20 17  Temp:  (!) 97.5 F (36.4 C)  SpO2:  100%    Intake/Output Summary (Last 24 hours) at 11/29/2019 1350 Last data filed at 11/29/2019 1008 Gross per 24 hour  Intake 236 ml  Output 500 ml  Net -264 ml   Filed Weights   12/16/2019 1613 11/29/19 0111 11/29/19 0448  Weight: 56 kg 56.4 kg 56 kg    ROS: Review of Systems  Respiratory: Negative for shortness of breath.   Cardiovascular: Negative for chest pain.  Gastrointestinal: Negative for abdominal pain, nausea and vomiting.   Exam: Physical Exam HENT:     Nose: No mucosal edema.     Mouth/Throat:     Pharynx: No oropharyngeal exudate.  Eyes:     General: Lids are normal.     Conjunctiva/sclera: Conjunctivae normal.     Pupils: Pupils are equal, round, and reactive to light.  Cardiovascular:     Rate and Rhythm: Normal rate and regular rhythm.     Heart sounds: S1 normal and S2 normal. Murmur heard.  Systolic murmur is present with a grade of 2/6.   Pulmonary:     Effort: No respiratory distress.     Breath sounds: No wheezing, rhonchi or rales.  Abdominal:     Palpations: Abdomen is soft.     Tenderness: There is no abdominal tenderness.  Musculoskeletal:     Right ankle: No swelling.     Left ankle: No swelling.  Skin:    General: Skin is warm.     Findings: No rash.  Neurological:     Mental Status: She is alert and oriented to person, place, and time.       Data Reviewed: Basic Metabolic  Panel: Recent Labs  Lab 12/05/2019 1628 11/29/19 0253  NA 138 141  K 4.8 4.3  CL 103 105  CO2 26 30  GLUCOSE 101* 90  BUN 26* 20  CREATININE 1.05* 0.89  CALCIUM 8.9 8.7*   Liver Function Tests: Recent Labs  Lab 12/06/2019 1628  AST 24  ALT 17  ALKPHOS 59  BILITOT 0.8  PROT 7.3  ALBUMIN 4.3   CBC: Recent Labs  Lab 12/08/2019 1628 11/29/19 0253  WBC 8.1 7.5  NEUTROABS 4.8  --   HGB 11.8* 11.9*  HCT 35.7* 34.9*  MCV 94.4 92.1  PLT 224 226   BNP (last 3 results) Recent Labs    12/03/2019 2026  BNP 462.7*      Recent Results (from the past 240 hour(s))  SARS Coronavirus 2 by RT PCR (hospital order, performed in [facility] hospital lab) Nasopharyngeal Nasopharyngeal Swab     Status: None   Collection Time: 11/27/2019  6:03 PM   Specimen: Nasopharyngeal Swab  Result Value Ref Range Status   SARS Coronavirus 2 NEGATIVE NEGATIVE Final    Comment: (NOTE) SARS-CoV-2 target nucleic acids are NOT DETECTED.  The SARS-CoV-2 RNA is generally detectable in upper and lower respiratory specimens during the  acute phase of infection. The lowest concentration of SARS-CoV-2 viral copies this assay can detect is 250 copies / mL. A negative result does not preclude SARS-CoV-2 infection and should not be used as the sole basis for treatment or other patient management decisions.  A negative result may occur with improper specimen collection / handling, submission of specimen other than nasopharyngeal swab, presence of viral mutation(s) within the areas targeted by this assay, and inadequate number of viral copies (<250 copies / mL). A negative result must be combined with clinical observations, patient history, and epidemiological information.  Fact Sheet for Patients:   StrictlyIdeas.no  Fact Sheet for Healthcare Providers: BankingDealers.co.za  This test is not yet approved or  cleared by the [place] FDA and has been  authorized for detection and/or diagnosis of SARS-CoV-2 by FDA under an Emergency Use Authorization (EUA).  This EUA will remain in effect (meaning this test can be used) for the duration of the COVID-19 declaration under Section 564(b)(1) of the Act, 21 U.S.C. section 360bbb-3(b)(1), unless the authorization is terminated or revoked sooner.  Performed at [facility], [address], [place], [place] [address]      Studies: DG Chest Portable 1 View  Result Date: 12/18/2019 CLINICAL DATA:  Chest pain EXAM: PORTABLE CHEST 1 VIEW COMPARISON:  October 25, 2019 FINDINGS: No edema or airspace opacity. Heart is borderline enlarged with pulmonary vascularity normal. No adenopathy. There is aortic atherosclerosis. No bone lesions. IMPRESSION: No edema or airspace opacity. Heart borderline enlarged. Aortic Atherosclerosis (ICD10-I70.0). Electronically Signed   By: Lowella Grip III M.D.   On: 12/09/2019 16:47    Scheduled Meds: . atorvastatin  40 mg Oral QPM  . clopidogrel  75 mg Oral Daily  . ezetimibe  10 mg Oral Daily  . levothyroxine  100 mcg Oral Daily  . metoprolol tartrate  25 mg Oral BID  . pantoprazole  40 mg Oral Daily  . potassium chloride  10 mEq Oral Daily  . ranolazine  500 mg Oral BID  . sodium chloride flush  3 mL Intravenous Q12H   Continuous Infusions: . sodium chloride    . [START ON 12/11/2019] sodium chloride     Followed by  . [START ON 12/05/2019] sodium chloride    . heparin 650 Units/hr (11/29/19 1101)    Assessment/Plan:  1. NSTEMI with history of CAD.  Cardiology saw and they would like to do a cardiac catheterization tomorrow.  Patient switched over to heparin drip.  Patient already on atorvastatin and metoprolol. 2. Essential hypertension on metoprolol 3. Hyperlipidemia unspecified on Lipitor.  LDL 62.  Also on Zetia. 4. GERD on PPI 5. Hypothyroidism unspecified on levothyroxine   Code Status:     Code Status Orders  (From admission,  onward)         Start     Ordered   11/26/2019 2334  Do not attempt resuscitation (DNR)  Continuous       Question Answer Comment  In the event of cardiac or respiratory ARREST Do not call a "code blue"   In the event of cardiac or respiratory ARREST Do not perform Intubation, CPR, defibrillation or ACLS   In the event of cardiac or respiratory ARREST Use medication by any route, position, wound care, and other measures to relive pain and suffering. May use oxygen, suction and manual treatment of airway obstruction as needed for comfort.      12/14/2019 2333        Code Status  History    Date Active Date Inactive Code Status Order ID Comments User Context   12/12/2019 2026 11/21/2019 2333 Full Code [id number]  Harvie Bridge, DO ED   10/25/2019 2231 10/28/2019 2236 DNR [id number]  Raiford Noble Solana, DO Inpatient   09/10/2016 1945 09/12/2016 0007 Full Code [id number]  Dustin Flock, MD Inpatient   02/05/2013 1737 02/09/2013 1620 Full Code [id number]  Newman Pies, MD Inpatient   Advance Care Planning Activity    Advance Directive Documentation     Most Recent Value  Type of Advance Directive Out of facility DNR (pink MOST or yellow form)  Pre-existing out of facility DNR order (yellow form or pink MOST form) Yellow form placed in chart (order not valid for inpatient use)  "MOST" Form in Place? --     Family Communication: Patient deferred me calling family at this time Disposition Plan: Status is: Inpatient  Dispo: The patient is from: Patient states she is from skilled nursing area with her husband              Anticipated d/c date is: Depends on cardiac cath results.  If cardiac cath clean and no intervention can potentially go home on 11/23/2019.  If a stent is placed may end up going home on 10-Dec-2019              Anticipated d/c location back to her skilled level care              Patient currently being treated for NSTEMI  Consultants:  Cardiology  Time spent: 28  minutes  [place]

## 2019-11-29 NOTE — Consult Note (Signed)
[place] for a heparin infusion Indication: chest pain/ACS  Patient Measurements: Height: 5\' 3"  (160 cm) Weight: 56 kg (123 lb 7.3 oz) IBW/kg (Calculated) : 52.4 Heparin Dosing Weight: 56 kg  Vital Signs: Temp: 97.8 F (36.6 C) (07/11 0753) Temp Source: Oral (07/11 0753) BP: 127/64 (07/11 0753) Pulse Rate: 79 (07/11 0753)  Labs: Recent Labs    11/30/2019 1628 11/19/2019 1628 11/30/2019 1803 11/25/2019 2025 11/29/19 0253  HGB 11.8*  --   --   --  11.9*  HCT 35.7*  --   --   --  34.9*  PLT 224  --   --   --  226  APTT  --   --   --  25  --   LABPROT  --   --   --  12.5 13.0  INR  --   --   --  1.0 1.0  CREATININE 1.05*  --   --   --  0.89  TROPONINIHS 314*   < > 263* 258* 329*   < > = values in this interval not displayed.    Estimated Creatinine Clearance: 34.8 mL/min (by C-G formula based on SCr of 0.89 mg/dL).   Medical History: Past Medical History:  Diagnosis Date  . Anemia    yrs ago  . Arthritis   . Back pain   . Carotid bruit    right side more so than left;but per pt not enough to clean out  . Constipation    metamucil bid  . Diverticulosis    right  . GERD (gastroesophageal reflux disease)    related to some meds;but doesn't take any medications  . History of migraine    stopped with menopause  . Hyperlipidemia    takes crestor daily  . Hypertension    takes Lisinopril daily  . Hypothyroidism    takes Synthroid daily  . Internal hemorrhoids   . Joint pain   . Joint swelling   . Macular degeneration    dry  . Nocturia   . NSTEMI (non-ST elevated myocardial infarction) ([place]) 07/25/2019  . Numbness    left foot  . Osteopenia   . Phlebitis    hx of-49yrs ago right leg;takes Plavix daily but on hold for surgery  . PONV (postoperative nausea and vomiting)   . Urinary frequency   . Urinary urgency     Medications:  Medications Prior to Admission  Medication Sig Dispense Refill Last Dose  . acetaminophen  (TYLENOL) 500 MG tablet Take 500 mg by mouth every 4 (four) hours as needed for mild pain or fever.   11/20/2019 at PRN  . alum & mag hydroxide-simeth (MAALOX PLUS) 400-400-40 MG/5ML suspension Take 30 mLs by mouth every 4 (four) hours as needed for indigestion.   Unknown at PRN  . atorvastatin (LIPITOR) 40 MG tablet Take 1 tablet (40 mg total) by mouth every evening. 30 tablet 0 11/27/2019 at [place]  . bisacodyl (DULCOLAX) 10 MG suppository Place 10 mg rectally as needed for moderate constipation.   6+ months at PRN  . calcium carbonate (TUMS - DOSED IN MG ELEMENTAL CALCIUM) 500 MG chewable tablet Chew 2 tablets by mouth every 2 (two) hours as needed for indigestion or heartburn.    Unknown at PRN  . clopidogrel (PLAVIX) 75 MG tablet Take 1 tablet (75 mg total) by mouth daily. 30 tablet 0 11/22/2019 at 0730  . ezetimibe (ZETIA) 10 MG tablet Take 1 tablet (10 mg total)  by mouth daily. 90 tablet 3 11/30/2019 at Unknown time  . famotidine (PEPCID) 20 MG tablet Take 20 mg by mouth 2 (two) times daily as needed for heartburn.    11/23/2019 at PRN  . KLOR-CON M10 10 MEQ tablet Take 10 mEq by mouth daily.   11/30/2019 at 0730  . Krill Oil 350 MG CAPS Take 350 mg by mouth daily.    11/26/2019 at 0730  . Lidocaine 4 % PTCH Apply 1 patch topically daily as needed (hip pain).    Unknown at PRN  . MegaRed Omega-3 Krill Oil 350 MG CAPS Take 350 mg by mouth daily.      . Methylcellulose, Laxative, (CITRUCEL) 500 MG TABS Take 500 mg by mouth daily as needed (constipation).    Unknown at PRN  . metoprolol tartrate (LOPRESSOR) 25 MG tablet Take 1 tablet (25 mg total) by mouth 2 (two) times daily. 60 tablet 0 11/27/2019 at 0745  . Multiple Vitamins-Minerals (PRESERVISION AREDS 2 PO) Take 1 capsule by mouth 2 (two) times daily.     . ondansetron (ZOFRAN) 4 MG tablet Take 4 mg by mouth every 6 (six) hours as needed for nausea or vomiting.   Unknown at PRN  . pantoprazole (PROTONIX) 40 MG tablet Take 40 mg by mouth daily.    11/19/2019 at 0615  . ranolazine (RANEXA) 500 MG 12 hr tablet Take 1 tablet (500 mg total) by mouth 2 (two) times daily. 60 tablet 0 12/15/2019 at 0730  . SYNTHROID 100 MCG tablet Take 100 mcg by mouth daily.   12/10/2019 at 0615  . nitroGLYCERIN (NITROSTAT) 0.4 MG SL tablet Place 0.4 mg under the tongue every 5 (five) minutes as needed for chest pain.   Unknown at PRN   Scheduled:  . atorvastatin  40 mg Oral QPM  . clopidogrel  75 mg Oral Daily  . ezetimibe  10 mg Oral Daily  . levothyroxine  100 mcg Oral Daily  . metoprolol tartrate  25 mg Oral BID  . pantoprazole  40 mg Oral Daily  . potassium chloride  10 mEq Oral Daily  . ranolazine  500 mg Oral BID   Infusions:   PRN: acetaminophen, alum & mag hydroxide-simeth, bisacodyl, calcium carbonate, famotidine, Methylcellulose (Laxative), nitroGLYCERIN, ondansetron (ZOFRAN) IV Anti-infectives (From admission, onward)   None      Assessment: 86 y.o. female with a history of carotid artery disease, recent NSTEMI, HLD, HTN, mild MR/MS who was seen for the evaluation of chest pain and troponin elevation. Pharmacy was consulted for starting a heparin infusion. She was recently admitted(last month) for NSTEMI but opted for medical management rather than catheterization.  Patient has no prior history of anticoagulant use. She was originally started with enoxaparin on admission and is now being transitioned to heparin. Her last dose of enoxaparin was 12/16/2019 at 2049 (55 mg). She was on heparin during her previous admission in June and that information will be used to guide the initial infusion rate.  Goal of Therapy:  Anti-Xa 0.3 - 0.7 IU/mL Monitor platelets by anticoagulation protocol: Yes   Plan:   Begin heparin infusion at 650 units/hr  Anti-Xa in 8 hours after infusion begins   CBC in am  Dallie Piles 11/29/2019,8:49 AM

## 2019-11-30 ENCOUNTER — Encounter: Payer: Self-pay | Admitting: Family Medicine

## 2019-11-30 ENCOUNTER — Encounter: Admission: EM | Disposition: E | Payer: Self-pay | Source: Home / Self Care | Attending: Internal Medicine

## 2019-11-30 ENCOUNTER — Inpatient Hospital Stay: Admit: 2019-11-30 | Payer: Medicare HMO

## 2019-11-30 DIAGNOSIS — E785 Hyperlipidemia, unspecified: Secondary | ICD-10-CM

## 2019-11-30 DIAGNOSIS — I10 Essential (primary) hypertension: Secondary | ICD-10-CM

## 2019-11-30 DIAGNOSIS — R634 Abnormal weight loss: Secondary | ICD-10-CM

## 2019-11-30 DIAGNOSIS — I2510 Atherosclerotic heart disease of native coronary artery without angina pectoris: Secondary | ICD-10-CM

## 2019-11-30 HISTORY — PX: LEFT HEART CATH AND CORONARY ANGIOGRAPHY: CATH118249

## 2019-11-30 LAB — CBC
HCT: 33.4 % — ABNORMAL LOW (ref 36.0–46.0)
Hemoglobin: 10.8 g/dL — ABNORMAL LOW (ref 12.0–15.0)
MCH: 30.4 pg (ref 26.0–34.0)
MCHC: 32.3 g/dL (ref 30.0–36.0)
MCV: 94.1 fL (ref 80.0–100.0)
Platelets: 206 10*3/uL (ref 150–400)
RBC: 3.55 MIL/uL — ABNORMAL LOW (ref 3.87–5.11)
RDW: 13.1 % (ref 11.5–15.5)
WBC: 6.4 10*3/uL (ref 4.0–10.5)
nRBC: 0 % (ref 0.0–0.2)

## 2019-11-30 LAB — CARDIAC CATHETERIZATION: Cath EF Quantitative: 30 %

## 2019-11-30 LAB — HEPARIN LEVEL (UNFRACTIONATED): Heparin Unfractionated: 0.51 IU/mL (ref 0.30–0.70)

## 2019-11-30 SURGERY — LEFT HEART CATH AND CORONARY ANGIOGRAPHY
Anesthesia: Moderate Sedation

## 2019-11-30 MED ORDER — ONDANSETRON HCL 4 MG/2ML IJ SOLN
INTRAMUSCULAR | Status: AC
  Filled 2019-11-30: qty 2

## 2019-11-30 MED ORDER — SODIUM CHLORIDE 0.9% FLUSH
3.0000 mL | Freq: Two times a day (BID) | INTRAVENOUS | Status: DC
  Administered 2019-11-30 – 2019-12-01 (×4): 3 mL via INTRAVENOUS

## 2019-11-30 MED ORDER — HEPARIN SODIUM (PORCINE) 1000 UNIT/ML IJ SOLN
INTRAMUSCULAR | Status: AC
  Filled 2019-11-30: qty 1

## 2019-11-30 MED ORDER — NITROGLYCERIN IN D5W 200-5 MCG/ML-% IV SOLN
INTRAVENOUS | Status: AC | PRN
  Administered 2019-11-30: 10 ug/min via INTRAVENOUS

## 2019-11-30 MED ORDER — VERAPAMIL HCL 2.5 MG/ML IV SOLN
INTRAVENOUS | Status: DC | PRN
  Administered 2019-11-30: 2.5 mg via INTRA_ARTERIAL

## 2019-11-30 MED ORDER — HEPARIN SODIUM (PORCINE) 1000 UNIT/ML IJ SOLN
INTRAMUSCULAR | Status: DC | PRN
  Administered 2019-11-30: 3000 [IU] via INTRAVENOUS

## 2019-11-30 MED ORDER — ONDANSETRON HCL 4 MG/2ML IJ SOLN
INTRAMUSCULAR | Status: DC | PRN
  Administered 2019-11-30: 4 mg via INTRAVENOUS

## 2019-11-30 MED ORDER — HEPARIN (PORCINE) IN NACL 1000-0.9 UT/500ML-% IV SOLN
INTRAVENOUS | Status: DC | PRN
  Administered 2019-11-30: 500 mL

## 2019-11-30 MED ORDER — VERAPAMIL HCL 2.5 MG/ML IV SOLN
INTRAVENOUS | Status: AC
  Filled 2019-11-30: qty 2

## 2019-11-30 MED ORDER — NITROGLYCERIN IN D5W 200-5 MCG/ML-% IV SOLN
0.0000 ug/min | INTRAVENOUS | Status: DC
  Administered 2019-11-30: 10 ug/min via INTRAVENOUS

## 2019-11-30 MED ORDER — IOHEXOL 300 MG/ML  SOLN
INTRAMUSCULAR | Status: DC | PRN
  Administered 2019-11-30: 85 mL

## 2019-11-30 MED ORDER — LIDOCAINE HCL (PF) 1 % IJ SOLN
INTRAMUSCULAR | Status: AC
  Filled 2019-11-30: qty 30

## 2019-11-30 MED ORDER — SODIUM CHLORIDE 0.9 % IV SOLN: 250.0000 mL | INTRAVENOUS | Status: DC | PRN

## 2019-11-30 MED ORDER — HEPARIN (PORCINE) 25000 UT/250ML-% IV SOLN
650.0000 [IU]/h | INTRAVENOUS | Status: DC
  Administered 2019-11-30: 650 [IU]/h via INTRAVENOUS
  Filled 2019-11-30: qty 250

## 2019-11-30 MED ORDER — BISACODYL 5 MG PO TBEC: 10.0000 mg | DELAYED_RELEASE_TABLET | Freq: Every day | ORAL | Status: DC | PRN

## 2019-11-30 MED ORDER — SODIUM CHLORIDE 0.9% FLUSH: 3.0000 mL | INTRAVENOUS | Status: DC | PRN

## 2019-11-30 MED ORDER — LIDOCAINE HCL (PF) 1 % IJ SOLN
INTRAMUSCULAR | Status: DC | PRN
  Administered 2019-11-30: 2 mL via SUBCUTANEOUS

## 2019-11-30 SURGICAL SUPPLY — 9 items
CATH INFINITI 5 FR JL3.5 (CATHETERS) ×2 IMPLANT
CATH INFINITI 5FR JK (CATHETERS) ×2 IMPLANT
DEVICE RAD TR BAND REGULAR (VASCULAR PRODUCTS) ×2 IMPLANT
GLIDESHEATH SLEND SS 6F .021 (SHEATH) ×2 IMPLANT
GUIDEWIRE INQWIRE 1.5J.035X260 (WIRE) ×1 IMPLANT
INQWIRE 1.5J .035X260CM (WIRE) ×2
KIT MANI 3VAL PERCEP (MISCELLANEOUS) ×2 IMPLANT
PACK CARDIAC CATH (CUSTOM PROCEDURE TRAY) ×2 IMPLANT
WIRE HITORQ VERSACORE ST 145CM (WIRE) ×2 IMPLANT

## 2019-11-30 NOTE — Consult Note (Signed)
[place] for a heparin infusion Indication: chest pain/ACS  Patient Measurements: Height: 5\' 3"  (160 cm) Weight: 55 kg (121 lb 3.2 oz) IBW/kg (Calculated) : 52.4 Heparin Dosing Weight: 56 kg  Vital Signs: Temp: 97.9 F (36.6 C) (07/12 1310) Temp Source: Oral (07/12 1310) BP: 132/60 (07/12 1310) Pulse Rate: 78 (07/12 1310)  Labs: Recent Labs    11/24/2019 1628 11/23/2019 1628 11/27/2019 1803 12/13/2019 2025 11/29/19 0253 11/29/19 0942 11/29/19 1840 11/28/2019 0247 12/07/2019 0423  HGB 11.8*   < >  --   --  11.9*  --   --  10.8*  --   HCT 35.7*  --   --   --  34.9*  --   --  33.4*  --   PLT 224  --   --   --  226  --   --  206  --   APTT  --   --   --  25  --   --   --   --   --   LABPROT  --   --   --  12.5 13.0  --   --   --   --   INR  --   --   --  1.0 1.0  --   --   --   --   HEPARINUNFRC  --   --   --   --   --   --  0.38  --  0.51  CREATININE 1.05*  --   --   --  0.89  --   --   --   --   TROPONINIHS 314*  --    < > 258* 329* 259*  --   --   --    < > = values in this interval not displayed.    Estimated Creatinine Clearance: 34.8 mL/min (by C-G formula based on SCr of 0.89 mg/dL).   Medical History: Past Medical History:  Diagnosis Date  . Anemia    yrs ago  . Arthritis   . Back pain   . Carotid bruit    right side more so than left;but per pt not enough to clean out  . Constipation    metamucil bid  . Diverticulosis    right  . GERD (gastroesophageal reflux disease)    related to some meds;but doesn't take any medications  . History of migraine    stopped with menopause  . Hyperlipidemia    takes crestor daily  . Hypertension    takes Lisinopril daily  . Hypothyroidism    takes Synthroid daily  . Internal hemorrhoids   . Joint pain   . Joint swelling   . Macular degeneration    dry  . Nocturia   . NSTEMI (non-ST elevated myocardial infarction) ([place]) 07/25/2019  . Numbness    left foot  . Osteopenia   .  Phlebitis    hx of-15yrs ago right leg;takes Plavix daily but on hold for surgery  . PONV (postoperative nausea and vomiting)   . Urinary frequency   . Urinary urgency      Assessment: 86 y.o. female with a history of carotid artery disease, recent NSTEMI, HLD, HTN, mild MR/MS who was seen for the evaluation of chest pain and troponin elevation. Pharmacy was consulted for starting a heparin infusion. She was recently admitted(last month) for NSTEMI but opted for medical management rather than catheterization.  Patient has no prior history of anticoagulant use.  She was originally started with enoxaparin on admission and is now being transitioned to heparin. Her last dose of enoxaparin was 12/03/2019 at 2049 (55 mg).   7/11 1840 HL 0.38: no change 7/12 0423 HL 0.51  Goal of Therapy:  Anti-Xa 0.3 - 0.7 IU/mL Monitor platelets by anticoagulation protocol: Yes   Plan:  Patient s/p cath. Per Cardiologist, resume heparin drip 8 hours post sheath removal. Sheath removed at 1400 per procedure log. Will plan to resume heparin at 2200 at previous rate of 650 units/hr. HL 7/13 ~ 0600 to confirm.   Tawnya Crook, PharmD 12/17/2019,2:10 PM

## 2019-11-30 NOTE — Plan of Care (Signed)
°  Problem: Education: Goal: Understanding of cardiac disease, CV risk reduction, and recovery process will improve 11/19/2019 1207 by Netta Cedars, RN Outcome: Progressing 12/14/2019 1207 by Netta Cedars, RN Outcome: Progressing   Problem: Activity: Goal: Ability to tolerate increased activity will improve 12/09/2019 1207 by Netta Cedars, RN Outcome: Progressing 11/24/2019 1207 by Netta Cedars, RN Outcome: Progressing   Problem: Cardiac: Goal: Ability to achieve and maintain adequate cardiovascular perfusion will improve 12/15/2019 1207 by Netta Cedars, RN Outcome: Progressing 12/12/2019 1207 by Netta Cedars, RN Outcome: Progressing

## 2019-11-30 NOTE — Consult Note (Signed)
[facility]iche[name] for a heparin infusion Indication: chest pain/ACS  Patient Measurements: Height: 5\' 3"  (160 cm) Weight: 55 kg (121 lb 3.2 oz) IBW/kg (Calculated) : 52.4 Heparin Dosing Weight: 56 kg  Vital Signs: Temp: 97.9 F (36.6 C) (07/12 0332) BP: 125/57 (07/12 0332) Pulse Rate: 78 (07/12 0332)  Labs: Recent Labs    12/18/2019 1628 12/17/2019 1628 12/07/2019 1803 11/23/2019 2025 11/29/19 0253 11/29/19 0942 11/29/19 1840 12/10/2019 0247 11/30/19 0423  HGB 11.8*   < >  --   --  11.9*  --   --  10.8*  --   HCT 35.7*  --   --   --  34.9*  --   --  33.4*  --   PLT 224  --   --   --  226  --   --  206  --   APTT  --   --   --  25  --   --   --   --   --   LABPROT  --   --   --  12.5 13.0  --   --   --   --   INR  --   --   --  1.0 1.0  --   --   --   --   HEPARINUNFRC  --   --   --   --   --   --  0.38  --  0.51  CREATININE 1.05*  --   --   --  0.89  --   --   --   --   TROPONINIHS 314*  --    < > 258* 329* 259*  --   --   --    < > = values in this interval not displayed.    Estimated Creatinine Clearance: 34.8 mL/min (by C-G formula based on SCr of 0.89 mg/dL).   Medical History: Past Medical History:  Diagnosis Date  . Anemia    yrs ago  . Arthritis   . Back pain   . Carotid bruit    right side more so than left;but per pt not enough to clean out  . Constipation    metamucil bid  . Diverticulosis    right  . GERD (gastroesophageal reflux disease)    related to some meds;but doesn't take any medications  . History of migraine    stopped with menopause  . Hyperlipidemia    takes crestor daily  . Hypertension    takes Lisinopril daily  . Hypothyroidism    takes Synthroid daily  . Internal hemorrhoids   . Joint pain   . Joint swelling   . Macular degeneration    dry  . Nocturia   . NSTEMI (non-ST elevated myocardial infarction) ([place]) 07/25/2019  . Numbness    left foot  . Osteopenia   . Phlebitis    hx of-73yrs ago right  leg;takes Plavix daily but on hold for surgery  . PONV (postoperative nausea and vomiting)   . Urinary frequency   . Urinary urgency     Medications:  Medications Prior to Admission  Medication Sig Dispense Refill Last Dose  . acetaminophen (TYLENOL) 500 MG tablet Take 500 mg by mouth every 4 (four) hours as needed for mild pain or fever.   12/11/2019 at PRN  . alum & mag hydroxide-simeth (MAALOX PLUS) 400-400-40 MG/5ML suspension Take 30 mLs by mouth every 4 (four) hours as needed for indigestion.  Unknown at PRN  . atorvastatin (LIPITOR) 40 MG tablet Take 1 tablet (40 mg total) by mouth every evening. 30 tablet 0 11/27/2019 at [place]  . bisacodyl (DULCOLAX) 10 MG suppository Place 10 mg rectally as needed for moderate constipation.   6+ months at PRN  . calcium carbonate (TUMS - DOSED IN MG ELEMENTAL CALCIUM) 500 MG chewable tablet Chew 2 tablets by mouth every 2 (two) hours as needed for indigestion or heartburn.    Unknown at PRN  . clopidogrel (PLAVIX) 75 MG tablet Take 1 tablet (75 mg total) by mouth daily. 30 tablet 0 11/20/2019 at 0730  . ezetimibe (ZETIA) 10 MG tablet Take 1 tablet (10 mg total) by mouth daily. 90 tablet 3 11/25/2019 at Unknown time  . famotidine (PEPCID) 20 MG tablet Take 20 mg by mouth 2 (two) times daily as needed for heartburn.    11/23/2019 at PRN  . KLOR-CON M10 10 MEQ tablet Take 10 mEq by mouth daily.   12/07/2019 at 0730  . Krill Oil 350 MG CAPS Take 350 mg by mouth daily.    11/26/2019 at 0730  . Lidocaine 4 % PTCH Apply 1 patch topically daily as needed (hip pain).    Unknown at PRN  . MegaRed Omega-3 Krill Oil 350 MG CAPS Take 350 mg by mouth daily.      . Methylcellulose, Laxative, (CITRUCEL) 500 MG TABS Take 500 mg by mouth daily as needed (constipation).    Unknown at PRN  . metoprolol tartrate (LOPRESSOR) 25 MG tablet Take 1 tablet (25 mg total) by mouth 2 (two) times daily. 60 tablet 0 11/27/2019 at 0745  . Multiple Vitamins-Minerals (PRESERVISION AREDS 2 PO)  Take 1 capsule by mouth 2 (two) times daily.     . ondansetron (ZOFRAN) 4 MG tablet Take 4 mg by mouth every 6 (six) hours as needed for nausea or vomiting.   Unknown at PRN  . pantoprazole (PROTONIX) 40 MG tablet Take 40 mg by mouth daily.   12/17/2019 at 0615  . ranolazine (RANEXA) 500 MG 12 hr tablet Take 1 tablet (500 mg total) by mouth 2 (two) times daily. 60 tablet 0 12/11/2019 at 0730  . SYNTHROID 100 MCG tablet Take 100 mcg by mouth daily.   11/22/2019 at 0615  . nitroGLYCERIN (NITROSTAT) 0.4 MG SL tablet Place 0.4 mg under the tongue every 5 (five) minutes as needed for chest pain.   Unknown at PRN   Scheduled:  . atorvastatin  40 mg Oral QPM  . clopidogrel  75 mg Oral Daily  . ezetimibe  10 mg Oral Daily  . levothyroxine  100 mcg Oral Daily  . metoprolol tartrate  25 mg Oral BID  . pantoprazole  40 mg Oral Daily  . potassium chloride  10 mEq Oral Daily  . ranolazine  500 mg Oral BID  . sodium chloride flush  3 mL Intravenous Q12H   Infusions:  . sodium chloride    . sodium chloride 1 mL/kg/hr (12/05/2019 0540)  . heparin 650 Units/hr (11/29/19 1101)   PRN: sodium chloride, acetaminophen, alum & mag hydroxide-simeth, bisacodyl, calcium carbonate, famotidine, nitroGLYCERIN, ondansetron (ZOFRAN) IV, psyllium, sodium chloride flush Anti-infectives (From admission, onward)   None      Assessment: 86 y.o. female with a history of carotid artery disease, recent NSTEMI, HLD, HTN, mild MR/MS who was seen for the evaluation of chest pain and troponin elevation. Pharmacy was consulted for starting a heparin infusion. She was recently admitted(last month) for NSTEMI  but opted for medical management rather than catheterization.  Patient has no prior history of anticoagulant use. She was originally started with enoxaparin on admission and is now being transitioned to heparin. Her last dose of enoxaparin was 12/07/2019 at 2049 (55 mg). She was on heparin during her previous admission in June and  that information will be used to guide the initial infusion rate.  7/11 1840 HL 0.38: no change 7/12 0423 HL 0.51  Goal of Therapy:  Anti-Xa 0.3 - 0.7 IU/mL Monitor platelets by anticoagulation protocol: Yes   Plan:  Heparin level is therapeutic x 2, will continue current rate at 650 units/hr. Will recheck anti-xa and CBC with AM labs.   Oswald Hillock, PharmD, BCPS 12/07/2019,7:14 AM

## 2019-11-30 NOTE — Progress Notes (Signed)
I had a prolonged discussion with the patient about the cardiac cath findings.  The patient was holding for something straightforward to be treated with PCI but obviously that is not the case here.  I discussed with her the option of left main PCI with the understanding that it will definitely be a very high risk procedure especially with her decreased ejection fraction, age and frail status.  The patient does not want to go through a risky procedure like that and prefers to be treated medically. She has responded to intravenous nitroglycerin as she had chest pain after cardiac catheterization.  We will continue the nitroglycerin overnight and consider starting her on small dose Imdur tomorrow if blood pressure tolerates.  Heparin drip can also be discontinued tomorrow. I discussed this plan with her daughter Mickel Baas) over the phone who is in agreement.  I explained to her that if chest pain becomes uncontrollable, we might need to consider focusing on comfort care.  From a coronary standpoint, the patient will likely do poorly.

## 2019-11-30 NOTE — Progress Notes (Signed)
Consent for cardiac cath obtained & placed in chart.

## 2019-11-30 NOTE — Progress Notes (Signed)
nitroGLYCERIN 50 mg in dextrose 5 % 250 mL (0.2 mg/mL) infusion    Per handoff from cath lab Dr wants pt to remain on 30mcg/min continuous. This nurse reached out to DR. Arida to verify the order. Response through message in procedure. Will leave nitro at 3mcg/min until further notice.

## 2019-11-30 NOTE — TOC Initial Note (Signed)
Transition of Care [facility]) - Initial/Assessment Note    Patient Details  Name: Michele [name] MRN: [id number] Date of Birth: 04-25-1928  Transition of Care [facility]) CM/SW Contact:    Shelbie Ammons, RN Phone Number: 12/13/2019, 10:53 AM  Clinical Narrative:   RNCM assessed patient at bedside. Patient found lying in bed with eyes closed but was easily arousable.  Patient reports to feeling ok today and is anxious to get catheterization done so she can get back home. Patient reports that she lives in the skilled portion of [place] and is right across the hall from her husband.  RNCM reached out to [place] with [facility]. She reports that patient can return there at discharge and if therapy is indicated insurance auth can be arranged.  RNCM will follow for any needs.              Expected Discharge Plan: Skilled Nursing Facility Barriers to Discharge: Continued Medical Work up   Patient Goals and CMS Choice        Expected Discharge Plan and Services Expected Discharge Plan: [facility] [place]   Discharge Planning Services: CM Consult   Living arrangements for the past 2 months: [place]                                      Prior Living Arrangements/Services Living arrangements for the past 2 months: [place] Lives with:: Facility Resident Patient language and need for interpreter reviewed:: Yes        Need for Family Participation in Patient Care: Yes (Comment) Care giver support system in place?: Yes (comment)   Criminal Activity/Legal Involvement Pertinent to Current Situation/Hospitalization: No - Comment as needed  Activities of Daily Living Home Assistive Devices/Equipment: Eyeglasses, Environmental consultant (specify type), Wheelchair, Built-in shower seat ADL Screening (condition at time of admission) Patient's cognitive ability adequate to safely complete daily activities?: Yes Is the patient deaf or have difficulty  hearing?: No Does the patient have difficulty seeing, even when wearing glasses/contacts?: No Does the patient have difficulty concentrating, remembering, or making decisions?: No Patient able to express need for assistance with ADLs?: Yes Does the patient have difficulty dressing or bathing?: Yes Independently performs ADLs?: No Communication: Independent Dressing (OT): Needs assistance Is this a change from baseline?: Pre-admission baseline Grooming: Needs assistance Is this a change from baseline?: Pre-admission baseline Feeding: Independent Bathing: Needs assistance Is this a change from baseline?: Pre-admission baseline Toileting: Needs assistance Is this a change from baseline?: Pre-admission baseline In/Out Bed: Needs assistance Is this a change from baseline?: Pre-admission baseline Walks in Home: Independent with device (comment) Is this a change from baseline?: Pre-admission baseline Does the patient have difficulty walking or climbing stairs?: Yes Weakness of Legs: None Weakness of Arms/Hands: None  Permission Sought/Granted                  Emotional Assessment Appearance:: Appears stated age Attitude/Demeanor/Rapport: Engaged Affect (typically observed): Appropriate, Accepting Orientation: : Oriented to Self, Oriented to Place, Oriented to  Time, Oriented to Situation Alcohol / Substance Use: Not Applicable Psych Involvement: No (comment)  Admission diagnosis:  Unstable angina ([place]) [I20.0] Chest pain, rule out acute myocardial infarction [R07.9] Patient Active Problem List   Diagnosis Date Noted  . Gastroesophageal reflux disease without esophagitis   . Chest pain, rule out acute myocardial infarction 12/05/2019  . Macular degeneration   . NSTEMI (  non-ST elevated myocardial infarction) ([place]) 10/25/2019  . Essential hypertension 10/25/2019  . Hyperlipidemia 10/25/2019  . Hypothyroidism 10/25/2019  . Left leg swelling 10/25/2019  . Chest pain 09/10/2016    PCP:  Kirk Ruths, MD Pharmacy:   [place], [address] [place] [address] Phone: [phone] Fax: [phone]  [place], [place] [address] [place] [address] Phone: [phone] Fax: [phone]     Social Determinants of Health (SDOH) Interventions    Readmission Risk Interventions No flowsheet data found.

## 2019-11-30 NOTE — Progress Notes (Signed)
Patient ID: Michele [name], female   DOB: 11/08/1927, 86 y.o.   MRN: [id number] [facility] PROGRESS NOTE  Michele [name] TIW:[id number] DOB: 09/27/1927 DOA: 12/14/2019 PCP: Michele Ruths, MD  HPI/Subjective: Patient feeling well this morning.  Did not have any chest pain or shortness of breath this morning.  She states that she has had some weight loss going over a few months and sometimes gets nauseous.  Last night had another episode of chest discomfort.  And also got nauseous.  Admitted with NSTEMI.  Objective: Vitals:   12/13/2019 1530 12/06/2019 1600  BP: 107/62 100/87  Pulse: 94 90  Resp: 17 16  Temp:    SpO2: 94% 97%    Intake/Output Summary (Last 24 hours) at 12/19/2019 1621 Last data filed at 12/10/2019 0750 Gross per 24 hour  Intake 402.23 ml  Output 1025 ml  Net -622.77 ml   Filed Weights   11/29/19 0111 11/29/19 0448 12/12/2019 0332  Weight: 56.4 kg 56 kg 55 kg    ROS: Review of Systems  Constitutional: Positive for weight loss.  Respiratory: Positive for shortness of breath.   Cardiovascular: Positive for chest pain.  Gastrointestinal: Positive for nausea. Negative for abdominal pain and vomiting.   Exam: Physical Exam HENT:     Nose: No mucosal edema.     Mouth/Throat:     Pharynx: No oropharyngeal exudate.  Eyes:     General: Lids are normal.     Conjunctiva/sclera: Conjunctivae normal.     Pupils: Pupils are equal, round, and reactive to light.  Cardiovascular:     Rate and Rhythm: Normal rate and regular rhythm.     Heart sounds: Normal heart sounds, S1 normal and S2 normal. No murmur heard.   Pulmonary:     Breath sounds: No decreased breath sounds, wheezing, rhonchi or rales.  Abdominal:     Palpations: Abdomen is soft.     Tenderness: There is no abdominal tenderness.  Musculoskeletal:     Right ankle: No swelling.     Left ankle: No swelling.  Skin:    General: Skin is warm.     Findings: No rash.  Neurological:      Mental Status: She is alert and oriented to person, place, and time.       Data Reviewed: Basic Metabolic Panel: Recent Labs  Lab 11/26/2019 1628 11/29/19 0253  NA 138 141  K 4.8 4.3  CL 103 105  CO2 26 30  GLUCOSE 101* 90  BUN 26* 20  CREATININE 1.05* 0.89  CALCIUM 8.9 8.7*   Liver Function Tests: Recent Labs  Lab 12/13/2019 1628  AST 24  ALT 17  ALKPHOS 59  BILITOT 0.8  PROT 7.3  ALBUMIN 4.3   CBC: Recent Labs  Lab 11/23/2019 1628 11/29/19 0253 12/09/2019 0247  WBC 8.1 7.5 6.4  NEUTROABS 4.8  --   --   HGB 11.8* 11.9* 10.8*  HCT 35.7* 34.9* 33.4*  MCV 94.4 92.1 94.1  PLT 224 226 206   BNP (last 3 results) Recent Labs    12/06/2019 2026  BNP 462.7*      Recent Results (from the past 240 hour(s))  SARS Coronavirus 2 by RT PCR (hospital order, performed in [facility] hospital lab) Nasopharyngeal Nasopharyngeal Swab     Status: None   Collection Time: 11/27/2019  6:03 PM   Specimen: Nasopharyngeal Swab  Result Value Ref Range Status   SARS Coronavirus 2 NEGATIVE NEGATIVE Final    Comment: (NOTE)  SARS-CoV-2 target nucleic acids are NOT DETECTED.  The SARS-CoV-2 RNA is generally detectable in upper and lower respiratory specimens during the acute phase of infection. The lowest concentration of SARS-CoV-2 viral copies this assay can detect is 250 copies / mL. A negative result does not preclude SARS-CoV-2 infection and should not be used as the sole basis for treatment or other patient management decisions.  A negative result may occur with improper specimen collection / handling, submission of specimen other than nasopharyngeal swab, presence of viral mutation(s) within the areas targeted by this assay, and inadequate number of viral copies (<250 copies / mL). A negative result must be combined with clinical observations, patient history, and epidemiological information.  Fact Sheet for Patients:   StrictlyIdeas.no  Fact  Sheet for Healthcare Providers: BankingDealers.co.za  This test is not yet approved or  cleared by the [place] FDA and has been authorized for detection and/or diagnosis of SARS-CoV-2 by FDA under an Emergency Use Authorization (EUA).  This EUA will remain in effect (meaning this test can be used) for the duration of the COVID-19 declaration under Section 564(b)(1) of the Act, 21 U.S.C. section 360bbb-3(b)(1), unless the authorization is terminated or revoked sooner.  Performed at [facility], [address], [place], [place] [address]      Studies: CARDIAC CATHETERIZATION  Result Date: 12/05/2019  There is moderate left ventricular systolic dysfunction.  LV end diastolic pressure is severely elevated.  Prox RCA lesion is 80% stenosed.  Ost LM to Mid LM lesion is 95% stenosed.  Ost LAD to Prox LAD lesion is 40% stenosed.  1.  Severe calcified left main disease and proximal RCA disease.  Right to left collaterals from the RCA to the septal branches of the LAD. 2.  Moderately reduced LV systolic function with an EF of 30 to 35% with severe anterior and apical hypokinesis. 3.  Severe aortic calcification and mitral annular calcifications. 4. Severely elevated LVEDP. Recommendations: Difficult revascularization options.  The patient is not a good candidate for CABG given age, frailty and significant aortic valve calcifications. PCI of the left main is not straightforward given significant calcifications and the need for arthrectomy and possible mechanical support.  Will obtain an echocardiogram and discuss with the patient and family.   DG Chest Portable 1 View  Result Date: 12/08/2019 CLINICAL DATA:  Chest pain EXAM: PORTABLE CHEST 1 VIEW COMPARISON:  October 25, 2019 FINDINGS: No edema or airspace opacity. Heart is borderline enlarged with pulmonary vascularity normal. No adenopathy. There is aortic atherosclerosis. No bone lesions. IMPRESSION: No edema or  airspace opacity. Heart borderline enlarged. Aortic Atherosclerosis (ICD10-I70.0). Electronically Signed   By: Lowella Grip III M.D.   On: 12/05/2019 16:47    Scheduled Meds: . [MAR Hold] atorvastatin  40 mg Oral QPM  . [MAR Hold] clopidogrel  75 mg Oral Daily  . [MAR Hold] ezetimibe  10 mg Oral Daily  . [MAR Hold] levothyroxine  100 mcg Oral Daily  . [MAR Hold] metoprolol tartrate  25 mg Oral BID  . [MAR Hold] pantoprazole  40 mg Oral Daily  . [MAR Hold] potassium chloride  10 mEq Oral Daily  . [MAR Hold] ranolazine  500 mg Oral BID  . sodium chloride flush  3 mL Intravenous Q12H   Continuous Infusions: . sodium chloride 10 mL/hr at 11/30/19 1421  . heparin    . nitroGLYCERIN      Assessment/Plan:  1. NSTEMI.  Cardiac cath today showing severe calcified left main  disease and proximal RCA disease.  EF on cath on the lower side.  Cardiology would like to get an echo first and then discuss treatment options.  Heparin will be restarted 8 hours after sheath removal.  Patient on atorvastatin, Plavix, metoprolol and ranolazine 2. Essential hypertension on metoprolol 3. Hyperlipidemia unspecified on Lipitor and Zetia.  LDL 62. 4. GERD on PPI 5. Hypothyroidism unspecified on levothyroxine 6. Weight loss.  May need further work-up as outpatient.   Code Status:     Code Status Orders  (From admission, onward)         Start     Ordered   12/10/2019 2334  Do not attempt resuscitation (DNR)  Continuous       Question Answer Comment  In the event of cardiac or respiratory ARREST Do not call a "code blue"   In the event of cardiac or respiratory ARREST Do not perform Intubation, CPR, defibrillation or ACLS   In the event of cardiac or respiratory ARREST Use medication by any route, position, wound care, and other measures to relive pain and suffering. May use oxygen, suction and manual treatment of airway obstruction as needed for comfort.      12/17/2019 2333        Code Status  History    Date Active Date Inactive Code Status Order ID Comments User Context   12/14/2019 2026 11/26/2019 2333 Full Code [id number]  Harvie Bridge, DO ED   10/25/2019 2231 10/28/2019 2236 DNR [id number]  Raiford Noble Dixon, DO Inpatient   09/10/2016 1945 09/12/2016 0007 Full Code [id number]  Dustin Flock, MD Inpatient   02/05/2013 1737 02/09/2013 1620 Full Code [id number]  Newman Pies, MD Inpatient   Advance Care Planning Activity    Advance Directive Documentation     Most Recent Value  Type of Advance Directive Out of facility DNR (pink MOST or yellow form)  Pre-existing out of facility DNR order (yellow form or pink MOST form) Yellow form placed in chart (order not valid for inpatient use)  "MOST" Form in Place? --     Family Communication: Patient deferred me calling family at this time Disposition Plan: Status is: Inpatient  Dispo: The patient is from: Patient states she is from skilled nursing area with her husband              Anticipated d/c date is: Now will be dependent on what cardiology wants to do next.              Anticipated d/c location back to her skilled level care              Patient currently being treated for NSTEMI.  Cardiology restarted heparin drip after cardiac catheterization.  Difficult treatment options with high risk stent placement versus CABG versus medical management.  Consultants:  Cardiology  Time spent: 27 minutes  [place]

## 2019-11-30 NOTE — Progress Notes (Signed)
Progress Note  Patient Name: Michele [name] Date of Encounter: 12/13/2019  [facility] Cardiologist: Fletcher Anon  Subjective   She reports recurrent chest pain last night.  She had 2 episodes with nausea but no vomiting.  Inpatient Medications    Scheduled Meds: . [MAR Hold] atorvastatin  40 mg Oral QPM  . [MAR Hold] clopidogrel  75 mg Oral Daily  . [MAR Hold] ezetimibe  10 mg Oral Daily  . [MAR Hold] levothyroxine  100 mcg Oral Daily  . [MAR Hold] metoprolol tartrate  25 mg Oral BID  . [MAR Hold] pantoprazole  40 mg Oral Daily  . [MAR Hold] potassium chloride  10 mEq Oral Daily  . [MAR Hold] ranolazine  500 mg Oral BID  . sodium chloride flush  3 mL Intravenous Q12H   Continuous Infusions: . sodium chloride    . sodium chloride 1 mL/kg/hr (11/29/2019 0540)  . heparin 650 Units/hr (11/29/19 1101)   PRN Meds: sodium chloride, [MAR Hold] acetaminophen, [MAR Hold] alum & mag hydroxide-simeth, [MAR Hold] bisacodyl, [MAR Hold] calcium carbonate, [MAR Hold] famotidine, [MAR Hold] nitroGLYCERIN, [MAR Hold] ondansetron (ZOFRAN) IV, [MAR Hold] psyllium, sodium chloride flush   Vital Signs    Vitals:   11/23/2019 0101 11/29/2019 0332 12/11/2019 0748 11/29/2019 1207  BP: (!) 103/57 (!) 125/57 108/66 113/66  Pulse: 83 78 75 79  Resp: 16 17 18 17   Temp: 98.1 F (36.7 C) 97.9 F (36.6 C) 98.1 F (36.7 C) 98 F (36.7 C)  TempSrc:   Oral   SpO2: 97% 100% 97% 99%  Weight:  55 kg    Height:        Intake/Output Summary (Last 24 hours) at 11/29/2019 1310 Last data filed at 12/05/2019 0750 Gross per 24 hour  Intake 402.23 ml  Output 1325 ml  Net -922.77 ml   Last 3 Weights 12/15/2019 11/29/2019 11/29/2019  Weight (lbs) 121 lb 3.2 oz 123 lb 7.3 oz 124 lb 6.4 oz  Weight (kg) 54.976 kg 56 kg 56.427 kg      Telemetry     normal sinus rhythm- Personally Reviewed  ECG    No one today - Personally Reviewed  Physical Exam   GEN: No acute distress.   Neck: No JVD Cardiac: RRR,  no murmurs, rubs, or gallops.  Respiratory: Clear to auscultation bilaterally. GI: Soft, nontender, non-distended  MS: No edema; No deformity. Neuro:  Nonfocal  Psych: Normal affect   Labs    High Sensitivity Troponin:   Recent Labs  Lab 12/15/2019 1628 12/17/2019 1803 12/09/2019 2025 11/29/19 0253 11/29/19 0942  TROPONINIHS 314* 263* 258* 329* 259*      Chemistry Recent Labs  Lab 11/26/2019 1628 11/29/19 0253  NA 138 141  K 4.8 4.3  CL 103 105  CO2 26 30  GLUCOSE 101* 90  BUN 26* 20  CREATININE 1.05* 0.89  CALCIUM 8.9 8.7*  PROT 7.3  --   ALBUMIN 4.3  --   AST 24  --   ALT 17  --   ALKPHOS 59  --   BILITOT 0.8  --   GFRNONAA 47* 57*  GFRAA 54* >60  ANIONGAP 9 6     Hematology Recent Labs  Lab 12/04/2019 1628 11/29/19 0253 11/30/2019 0247  WBC 8.1 7.5 6.4  RBC 3.78* 3.79* 3.55*  HGB 11.8* 11.9* 10.8*  HCT 35.7* 34.9* 33.4*  MCV 94.4 92.1 94.1  MCH 31.2 31.4 30.4  MCHC 33.1 34.1 32.3  RDW 13.3 13.2 13.1  PLT 224 226 206    BNP Recent Labs  Lab 12/06/2019 2026  BNP 462.7*     DDimer No results for input(s): DDIMER in the last 168 hours.   Radiology    DG Chest Portable 1 View  Result Date: 11/27/2019 CLINICAL DATA:  Chest pain EXAM: PORTABLE CHEST 1 VIEW COMPARISON:  October 25, 2019 FINDINGS: No edema or airspace opacity. Heart is borderline enlarged with pulmonary vascularity normal. No adenopathy. There is aortic atherosclerosis. No bone lesions. IMPRESSION: No edema or airspace opacity. Heart borderline enlarged. Aortic Atherosclerosis (ICD10-I70.0). Electronically Signed   By: Lowella Grip III M.D.   On: 12/18/2019 16:47    Cardiac Studies     Patient Profile     86 y.o. female  with a history of carotid artery disease, recent NSTEMI, HLD, HTN, mild MR/MS who presented with recurrent chest pain and mildly elevated troponin.     Assessment & Plan    1.  Non-ST elevation myocardial infarction: The patient had this presentation last month and  opted for conservative management.  However, she returns with recurrent chest pain and mildly elevated troponin and now is agreeable to proceed with left heart catheterization possible PCI.  I discussed the procedure in details as well as risk and benefits.  Continue heparin drip.  She is allergic to aspirin and thus she is on clopidogrel monotherapy.  Continue treatment with statin, beta-blocker and Ranexa.  Further recommendations to follow after cardiac catheterization which is scheduled for today.  2.  Essential hypertension: Blood pressure is reasonably controlled.  3.  Hyperlipidemia: LDL was 197 last month.  Continue treatment with atorvastatin 40 mg daily and Zetia 10 mg daily.    For questions or updates, please contact Vincent Please consult [email or web address] for contact info under        Signed, Kathlyn Sacramento, MD  11/30/2019, 1:10 PM

## 2019-12-01 ENCOUNTER — Encounter
Admission: RE | Admit: 2019-12-01 | Discharge: 2019-12-01 | Disposition: A | Discharge status: Home / Self Care | Payer: Medicare HMO | Source: Ambulatory Visit | Attending: Internal Medicine | Admitting: Internal Medicine

## 2019-12-01 ENCOUNTER — Inpatient Hospital Stay (HOSPITAL_COMMUNITY)
Admit: 2019-12-01 | Discharge: 2019-12-01 | Disposition: A | Discharge status: Home / Self Care | Payer: Medicare HMO | Attending: Cardiovascular Disease | Admitting: Cardiovascular Disease

## 2019-12-01 ENCOUNTER — Encounter: Payer: Self-pay | Admitting: Cardiovascular Disease

## 2019-12-01 DIAGNOSIS — I340 Nonrheumatic mitral (valve) insufficiency: Secondary | ICD-10-CM

## 2019-12-01 DIAGNOSIS — I959 Hypotension, unspecified: Secondary | ICD-10-CM

## 2019-12-01 LAB — CBC
HCT: 31.7 % — ABNORMAL LOW (ref 36.0–46.0)
Hemoglobin: 10.6 g/dL — ABNORMAL LOW (ref 12.0–15.0)
MCH: 31.1 pg (ref 26.0–34.0)
MCHC: 33.4 g/dL (ref 30.0–36.0)
MCV: 93 fL (ref 80.0–100.0)
Platelets: 194 10*3/uL (ref 150–400)
RBC: 3.41 MIL/uL — ABNORMAL LOW (ref 3.87–5.11)
RDW: 13.4 % (ref 11.5–15.5)
WBC: 6.1 10*3/uL (ref 4.0–10.5)
nRBC: 0 % (ref 0.0–0.2)

## 2019-12-01 LAB — BASIC METABOLIC PANEL
Anion gap: 11 (ref 5–15)
BUN: 20 mg/dL (ref 8–23)
CO2: 24 mmol/L (ref 22–32)
Calcium: 8.4 mg/dL — ABNORMAL LOW (ref 8.9–10.3)
Chloride: 104 mmol/L (ref 98–111)
Creatinine, Ser: 1.2 mg/dL — ABNORMAL HIGH (ref 0.44–1.00)
GFR calc Af Amer: 46 mL/min — ABNORMAL LOW (ref >60)
GFR calc non Af Amer: 40 mL/min — ABNORMAL LOW (ref >60)
Glucose, Bld: 98 mg/dL (ref 70–99)
Potassium: 4.1 mmol/L (ref 3.5–5.1)
Sodium: 139 mmol/L (ref 135–145)

## 2019-12-01 LAB — GLUCOSE, CAPILLARY: Glucose-Capillary: 164 mg/dL — ABNORMAL HIGH (ref 70–99)

## 2019-12-01 LAB — ECHOCARDIOGRAM COMPLETE
Height: 63 in
Weight: 1907.2 oz

## 2019-12-01 LAB — HEPARIN LEVEL (UNFRACTIONATED): Heparin Unfractionated: 0.36 IU/mL (ref 0.30–0.70)

## 2019-12-01 LAB — MRSA PCR SCREENING: MRSA by PCR: NEGATIVE

## 2019-12-01 MED ORDER — MORPHINE SULFATE (PF) 2 MG/ML IV SOLN: 2.0000 mg | Freq: Once | INTRAVENOUS | Status: DC

## 2019-12-01 MED ORDER — LORAZEPAM 1 MG PO TABS: 1.0000 mg | ORAL_TABLET | ORAL | Status: DC | PRN

## 2019-12-01 MED ORDER — MORPHINE 100MG IN NS 100ML (1MG/ML) PREMIX INFUSION
2.0000 mg/h | INTRAVENOUS | Status: DC
  Filled 2019-12-01: qty 100

## 2019-12-01 MED ORDER — LORAZEPAM 2 MG/ML PO CONC: 1.0000 mg | ORAL | Status: DC | PRN

## 2019-12-01 MED ORDER — METOPROLOL TARTRATE 25 MG PO TABS
12.5000 mg | ORAL_TABLET | Freq: Two times a day (BID) | ORAL | Status: DC
  Administered 2019-12-01 (×2): 12.5 mg via ORAL
  Filled 2019-12-01 (×3): qty 1

## 2019-12-01 MED ORDER — GLYCOPYRROLATE 1 MG PO TABS
1.0000 mg | ORAL_TABLET | ORAL | Status: DC | PRN
  Filled 2019-12-01: qty 1

## 2019-12-01 MED ORDER — GLYCOPYRROLATE 0.2 MG/ML IJ SOLN
0.2000 mg | INTRAMUSCULAR | Status: DC | PRN
  Filled 2019-12-01: qty 1

## 2019-12-01 MED ORDER — FUROSEMIDE 10 MG/ML IJ SOLN
20.0000 mg | Freq: Once | INTRAMUSCULAR | Status: AC
  Administered 2019-12-01: 20 mg via INTRAVENOUS
  Filled 2019-12-01: qty 2

## 2019-12-01 MED ORDER — HEPARIN SODIUM (PORCINE) 5000 UNIT/ML IJ SOLN
5000.0000 [IU] | Freq: Three times a day (TID) | INTRAMUSCULAR | Status: DC
  Administered 2019-12-01 (×2): 5000 [IU] via SUBCUTANEOUS
  Filled 2019-12-01 (×2): qty 1

## 2019-12-01 MED ORDER — MORPHINE SULFATE (PF) 2 MG/ML IV SOLN
INTRAVENOUS | Status: AC
  Filled 2019-12-01: qty 1

## 2019-12-01 MED ORDER — LORAZEPAM 2 MG/ML IJ SOLN
1.0000 mg | INTRAMUSCULAR | Status: DC | PRN
  Filled 2019-12-01: qty 1

## 2019-12-01 MED ORDER — DICLOFENAC SODIUM 1 % EX GEL
2.0000 g | Freq: Four times a day (QID) | CUTANEOUS | Status: DC
  Administered 2019-12-01 (×2): 2 g via TOPICAL
  Filled 2019-12-01: qty 100

## 2019-12-01 MED ORDER — ISOSORBIDE MONONITRATE ER 30 MG PO TB24
15.0000 mg | ORAL_TABLET | Freq: Every day | ORAL | Status: DC
  Administered 2019-12-01: 15 mg via ORAL
  Filled 2019-12-01: qty 1

## 2019-12-18 ENCOUNTER — Ambulatory Visit: Payer: Medicare HMO | Admitting: Physician Assistant

## 2019-12-20 NOTE — Plan of Care (Signed)
  Problem: Education: Goal: Understanding of cardiac disease, CV risk reduction, and recovery process will improve Outcome: Progressing   Problem: Education: Goal: Individualized Educational Video(s) Outcome: Progressing   Problem: Activity: Goal: Ability to tolerate increased activity will improve Outcome: Progressing

## 2019-12-20 NOTE — Care Management Important Message (Signed)
Important Message  Patient Details  Name: Michele [name] MRN: [id number] Date of Birth: February 19, 1928   Medicare Important Message Given:  Yes     Dannette Barbara 2019-12-28, 12:16 PM

## 2019-12-20 NOTE — Progress Notes (Signed)
Progress Note  Patient Name: Michele [name] Date of Encounter: 12-27-2019  Primary Cardiologist: Fletcher Anon  Subjective   Status post [place] on 12/7 which showed severe calcified left main and proximal RCA disease without good revascularization options surgically or percutaneously.  In this setting after discussion with the patient and her daughter medical management has been recommended.  She denies any chest pain or dizziness.  She does report some stable dyspnea.  Slight bump in her serum creatinine this morning.  Vital signs stable.  Inpatient Medications    Scheduled Meds: . atorvastatin  40 mg Oral QPM  . clopidogrel  75 mg Oral Daily  . ezetimibe  10 mg Oral Daily  . heparin injection (subcutaneous)  5,000 Units Subcutaneous Q8H  . isosorbide mononitrate  15 mg Oral Daily  . levothyroxine  100 mcg Oral Daily  . metoprolol tartrate  12.5 mg Oral BID  . pantoprazole  40 mg Oral Daily  . potassium chloride  10 mEq Oral Daily  . ranolazine  500 mg Oral BID  . sodium chloride flush  3 mL Intravenous Q12H   Continuous Infusions: . sodium chloride 10 mL/hr at 12/13/2019 1421   PRN Meds: sodium chloride, acetaminophen, alum & mag hydroxide-simeth, bisacodyl, calcium carbonate, famotidine, nitroGLYCERIN, ondansetron (ZOFRAN) IV, psyllium, sodium chloride flush   Vital Signs    Vitals:   12/11/2019 1704 11/24/2019 1947 12-27-19 0532 12-27-2019 0741  BP: (!) 103/59 103/62 (!) 95/45 (!) 106/50  Pulse: 97 100  79  Resp: 18   18  Temp: 98.3 F (36.8 C) 98.7 F (37.1 C) 98.5 F (36.9 C) 98.3 F (36.8 C)  TempSrc:  Oral Oral   SpO2: 97% 100% 93% 95%  Weight:   54.1 kg   Height:        Intake/Output Summary (Last 24 hours) at 12-27-2019 0844 Last data filed at 2019-12-27 0535 Gross per 24 hour  Intake --  Output 1400 ml  Net -1400 ml   Filed Weights   11/29/19 0448 12/04/2019 0332 12/27/19 0532  Weight: 56 kg 55 kg 54.1 kg    Telemetry    SR- Personally  Reviewed  ECG    No new tracings - Personally Reviewed  Physical Exam   GEN:  Elderly and frail-appearing.  No acute distress.   Neck: No JVD. Cardiac: RRR, no murmurs, rubs, or gallops.  Right radial cath site is bandaged without active bleeding, bruising, swelling, warmth, erythema, or tenderness to palpation.  Radial pulse 2+. Respiratory:  Faint crackles along the bilateral bases.  GI: Soft, nontender, non-distended.   MS: No edema; No deformity. Neuro:  Alert and oriented x 3; Nonfocal.  Psych: Normal affect.  Labs    Chemistry Recent Labs  Lab 12/13/2019 1628 11/29/19 0253 December 27, 2019 0650  NA 138 141 139  K 4.8 4.3 4.1  CL 103 105 104  CO2 26 30 24   GLUCOSE 101* 90 98  BUN 26* 20 20  CREATININE 1.05* 0.89 1.20*  CALCIUM 8.9 8.7* 8.4*  PROT 7.3  --   --   ALBUMIN 4.3  --   --   AST 24  --   --   ALT 17  --   --   ALKPHOS 59  --   --   BILITOT 0.8  --   --   GFRNONAA 47* 57* 40*  GFRAA 54* >60 46*  ANIONGAP 9 6 11      Hematology Recent Labs  Lab 11/29/19 0253 12/18/2019 0247  12-17-2019 0650  WBC 7.5 6.4 6.1  RBC 3.79* 3.55* 3.41*  HGB 11.9* 10.8* 10.6*  HCT 34.9* 33.4* 31.7*  MCV 92.1 94.1 93.0  MCH 31.4 30.4 31.1  MCHC 34.1 32.3 33.4  RDW 13.2 13.1 13.4  PLT 226 206 194    Cardiac EnzymesNo results for input(s): TROPONINI in the last 168 hours. No results for input(s): TROPIPOC in the last 168 hours.   BNP Recent Labs  Lab 11/24/2019 2026  BNP 462.7*     DDimer No results for input(s): DDIMER in the last 168 hours.   Radiology     Cardiac Studies   LHC 12/18/2019:  There is moderate left ventricular systolic dysfunction.  LV end diastolic pressure is severely elevated.  Prox RCA lesion is 80% stenosed.  Ost LM to Mid LM lesion is 95% stenosed.  Ost LAD to Prox LAD lesion is 40% stenosed.   1.  Severe calcified left main disease and proximal RCA disease.  Right to left collaterals from the RCA to the septal branches of the LAD. 2.   Moderately reduced LV systolic function with an EF of 30 to 35% with severe anterior and apical hypokinesis. 3.  Severe aortic calcification and mitral annular calcifications. 4. Severely elevated LVEDP.   Recommendations: Difficult revascularization options.  The patient is not a good candidate for CABG given age, frailty and significant aortic valve calcifications. PCI of the left main is not straightforward given significant calcifications and the need for arthrectomy and possible mechanical support.  Will obtain an echocardiogram and discuss with the patient and family. __________  2D echo pending  Patient Profile     86 y.o. female with history of CAD with recent NSTEMI medically managed in 10/2019, carotid artery disease, HTN, HLD, and mild mitral regurgitation and mitral stenosis who was admitted with recurrent NSTEMI.  Assessment & Plan    1.  CAD involving the native coronary arteries with NSTEMI and dyspnea: -Currently chest pain-free -Status post LHC on 12/10/2019 which showed severe calcified disease involving the left main and proximal RCA without good revascularization options surgically or percutaneously with recommendation for medical management after discussion with the patient and daughter -Discontinue heparin drip as she has completed 48 hours -Transition nitro drip to Imdur 15 mg daily -Severely elevated LVEDP on LHC indicating degree of volume overload -Give a one-time dose of IV Lasix 20 mg this morning -Continue clopidogrel monotherapy given aspirin allergy -Metoprolol -Lipitor -Ranexa -Post-cath instructions -Patient will benefit from palliative care referral and outpatient follow-up -Should she have recurrent angina given the above underlying coronary disease comfort measures may need to be considered -Would keep admitted 1 more day to assess symptom control on oral medications  2.  HTN: -Blood pressure mildly soft on nitro drip which has subsequently been  discontinued -Continue metoprolol and Imdur as above  3.  HLD: -LDL 197 last month -Continue atorvastatin and Zetia  4.  AKI: -Slight bump in renal function following LHC -Give one-time dose of Lasix as outlined above -Monitor   For questions or updates, please contact [place] Please consult [email or web address] for contact info under Cardiology/STEMI.    Signed, Christell Faith, PA-C [place] Pager: [phone] 2019/12/17, 8:44 AM

## 2019-12-20 NOTE — Evaluation (Signed)
Physical Therapy Evaluation Patient Details Name: Michele [name] MRN: [id number] DOB: 12-02-1927 Today's Date: 12-Dec-2019   History of Present Illness  86 y.o. female presenting to hospital with chest pain, was admitted similar presensation in June (diagnosed NSTEMI)  PMH includes anemia, htn, macular degeneration, bunions, T12 compression fx, and L1 wedge deformity.  Clinical Impression  Pt reports she has been very weak for the last 2 weeks and was not sure how much she would be able to do with PT today, but was very willing to try and do what she could.  Ultimately she did well with mobility and was able to get to EOB and then to standing w/o phyiscal assist.  She was slow but steady with ambulation, but reported fatigue and need to sit after ~40 ft with walker.  This is much less than her baseline and though she should be able to return to SNF she will need continued PT services in that setting.      Follow Up Recommendations  (in house PT (officially outpatient) at [facility])    Equipment Recommendations  None recommended by PT    Recommendations for Other Services       Precautions / Restrictions Precautions Precautions: Fall Restrictions Weight Bearing Restrictions: No      Mobility  Bed Mobility Overal bed mobility: Modified Independent             General bed mobility comments: Pt with use of rails, but no phyiscal assist to get to sitting EOB  Transfers Overall transfer level: Modified independent Equipment used: Rolling walker (2 wheeled)             General transfer comment: Pt did not need assist with rising to standing, slow to rise with heavy UE use but no unsteadiness or safety issues.   Ambulation/Gait Ambulation/Gait assistance: Supervision Gait Distance (Feet): 40 Feet Assistive device: Rolling walker (2 wheeled)       General Gait Details: Pt with slow, cautious but consistent cadence.  No LOBs or safety issues, quickly to fatigue (O2  remained in the mid 90s on room air, HR in the 90's post ambulation).  Pt reports normally being able to walk much farther but the last few weeks have been more similar to today's performance.   Stairs            Wheelchair Mobility    Modified Rankin (Stroke Patients Only)       Balance Overall balance assessment: Modified Independent                                           Pertinent Vitals/Pain      Home Living Family/patient expects to be discharged to:: Skilled nursing facility Living Arrangements: Spouse/significant other                    Prior Function Level of Independence: Needs assistance   Gait / Transfers Assistance Needed: Ambulates with RW in room and has staff assist when walking with walker in hallway; uses manual w/c in bathroom (finds it easier and safer) and outside of room on own.  Pt reports she calls for help getting out of bed to w/c but has recently been able to progress to transferring w/c to bed on own.  ADL's / Homemaking Assistance Needed: Per OT eval "Pt reports taking sponge baths at sink, mod indep with dressing  using AE for socks, and only getting assist for meds, meals, and cleaning"        Hand Dominance   Dominant Hand: Right    Extremity/Trunk Assessment   Upper Extremity Assessment Upper Extremity Assessment: Generalized weakness    Lower Extremity Assessment Lower Extremity Assessment: Generalized weakness       Communication   Communication: HOH  Cognition Arousal/Alertness: Awake/alert Behavior During Therapy: WFL for tasks assessed/performed Overall Cognitive Status: Within Functional Limits for tasks assessed                                        General Comments      Exercises     Assessment/Plan    PT Assessment Patient needs continued PT services  PT Problem List Decreased strength;Decreased activity tolerance;Decreased balance;Decreased knowledge of use of  DME;Decreased safety awareness;Decreased mobility;Cardiopulmonary status limiting activity       PT Treatment Interventions DME instruction;Gait training;Stair training;Functional mobility training;Therapeutic activities;Therapeutic exercise;Balance training;Neuromuscular re-education;Patient/family education    PT Goals (Current goals can be found in the Care Plan section)  Acute Rehab PT Goals Patient Stated Goal: go home tomorrow PT Goal Formulation: With patient Time For Goal Achievement: 12/15/19 Potential to Achieve Goals: Fair    Frequency Min 2X/week   Barriers to discharge        Co-evaluation               AM-PAC PT "6 Clicks" Mobility  Outcome Measure Help needed turning from your back to your side while in a flat bed without using bedrails?: None Help needed moving from lying on your back to sitting on the side of a flat bed without using bedrails?: A Little Help needed moving to and from a bed to a chair (including a wheelchair)?: A Little Help needed standing up from a chair using your arms (e.g., wheelchair or bedside chair)?: None Help needed to walk in hospital room?: A Little Help needed climbing 3-5 steps with a railing? : A Little 6 Click Score: 20    End of Session Equipment Utilized During Treatment: Gait belt Activity Tolerance: Patient tolerated treatment well;Patient limited by fatigue Patient left: with chair alarm set;with call bell/phone within reach Nurse Communication: Mobility status PT Visit Diagnosis: Muscle weakness (generalized) (M62.81);Difficulty in walking, not elsewhere classified (R26.2)    Time: 5093-2671 PT Time Calculation (min) (ACUTE ONLY): 29 min   Charges:   PT Evaluation $PT Eval Low Complexity: 1 Low PT Treatments $Gait Training: 8-22 mins        Kreg Shropshire, DPT 19-Dec-2019, 1:46 PM

## 2019-12-20 NOTE — Progress Notes (Signed)
Patient ID: Michele [name], female   DOB: 1928-03-23, 86 y.o.   MRN: [id number] [facility] PROGRESS NOTE  Michele [name] NAT:[id number] DOB: 11-Jul-1927 DOA: 12/06/2019 PCP: Kirk Ruths, MD  HPI/Subjective: Patient was admitted with NSTEMI.  No further chest pain or shortness of breath.  Does not feel well today feels tired.  Objective: Vitals:   2019/12/20 0532 20-Dec-2019 0741  BP: (!) 95/45 (!) 106/50  Pulse:  79  Resp:  18  Temp: 98.5 F (36.9 C) 98.3 F (36.8 C)  SpO2: 93% 95%    Intake/Output Summary (Last 24 hours) at December 20, 2019 1223 Last data filed at Dec 20, 2019 0915 Gross per 24 hour  Intake 240 ml  Output 1600 ml  Net -1360 ml   Filed Weights   11/29/19 0448 12/04/2019 0332 12/20/19 0532  Weight: 56 kg 55 kg 54.1 kg    ROS: Review of Systems  Respiratory: Negative for shortness of breath.   Cardiovascular: Negative for chest pain.  Gastrointestinal: Negative for abdominal pain.  Musculoskeletal: Positive for joint pain.   Exam: Physical Exam HENT:     Nose: No mucosal edema.     Mouth/Throat:     Pharynx: No oropharyngeal exudate.  Eyes:     General: Lids are normal.     Conjunctiva/sclera: Conjunctivae normal.     Pupils: Pupils are equal, round, and reactive to light.  Cardiovascular:     Rate and Rhythm: Normal rate and regular rhythm.     Heart sounds: S1 normal and S2 normal.  Pulmonary:     Breath sounds: No decreased breath sounds, wheezing, rhonchi or rales.  Abdominal:     Palpations: Abdomen is soft.     Tenderness: There is no abdominal tenderness.  Musculoskeletal:     Right ankle: No swelling.     Left ankle: No swelling.  Skin:    General: Skin is warm.     Findings: No rash.  Neurological:     Mental Status: She is alert and oriented to person, place, and time.       Data Reviewed: Basic Metabolic Panel: Recent Labs  Lab 12/10/2019 1628 11/29/19 0253 2019-12-20 0650  NA 138 141 139  K 4.8 4.3 4.1   CL 103 105 104  CO2 26 30 24   GLUCOSE 101* 90 98  BUN 26* 20 20  CREATININE 1.05* 0.89 1.20*  CALCIUM 8.9 8.7* 8.4*   Liver Function Tests: Recent Labs  Lab 11/19/2019 1628  AST 24  ALT 17  ALKPHOS 59  BILITOT 0.8  PROT 7.3  ALBUMIN 4.3   CBC: Recent Labs  Lab 12/09/2019 1628 11/29/19 0253 12/16/2019 0247 2019-12-20 0650  WBC 8.1 7.5 6.4 6.1  NEUTROABS 4.8  --   --   --   HGB 11.8* 11.9* 10.8* 10.6*  HCT 35.7* 34.9* 33.4* 31.7*  MCV 94.4 92.1 94.1 93.0  PLT 224 226 206 194   BNP (last 3 results) Recent Labs    11/30/2019 2026  BNP 462.7*     Recent Results (from the past 240 hour(s))  SARS Coronavirus 2 by RT PCR (hospital order, performed in [facility] hospital lab) Nasopharyngeal Nasopharyngeal Swab     Status: None   Collection Time: 12/08/2019  6:03 PM   Specimen: Nasopharyngeal Swab  Result Value Ref Range Status   SARS Coronavirus 2 NEGATIVE NEGATIVE Final    Comment: (NOTE) SARS-CoV-2 target nucleic acids are NOT DETECTED.  The SARS-CoV-2 RNA is generally detectable in upper and lower  respiratory specimens during the acute phase of infection. The lowest concentration of SARS-CoV-2 viral copies this assay can detect is 250 copies / mL. A negative result does not preclude SARS-CoV-2 infection and should not be used as the sole basis for treatment or other patient management decisions.  A negative result may occur with improper specimen collection / handling, submission of specimen other than nasopharyngeal swab, presence of viral mutation(s) within the areas targeted by this assay, and inadequate number of viral copies (<250 copies / mL). A negative result must be combined with clinical observations, patient history, and epidemiological information.  Fact Sheet for Patients:   StrictlyIdeas.no  Fact Sheet for Healthcare Providers: BankingDealers.co.za  This test is not yet approved or  cleared by the [place] FDA and has been authorized for detection and/or diagnosis of SARS-CoV-2 by FDA under an Emergency Use Authorization (EUA).  This EUA will remain in effect (meaning this test can be used) for the duration of the COVID-19 declaration under Section 564(b)(1) of the Act, 21 U.S.C. section 360bbb-3(b)(1), unless the authorization is terminated or revoked sooner.  Performed at [facility], [address] [place], [place] [address]   MRSA PCR Screening     Status: None   Collection Time: 12-21-19  5:59 AM   Specimen: Nasopharyngeal  Result Value Ref Range Status   MRSA by PCR NEGATIVE NEGATIVE Final    Comment:        The GeneXpert MRSA Assay (FDA approved for NASAL specimens only), is one component of a comprehensive MRSA colonization surveillance program. It is not intended to diagnose MRSA infection nor to guide or monitor treatment for MRSA infections. Performed at [facility], [address] [place], [place] [address]      Studies: CARDIAC CATHETERIZATION  Result Date: 11/23/2019  There is moderate left ventricular systolic dysfunction.  LV end diastolic pressure is severely elevated.  Prox RCA lesion is 80% stenosed.  Ost LM to Mid LM lesion is 95% stenosed.  Ost LAD to Prox LAD lesion is 40% stenosed.  1.  Severe calcified left main disease and proximal RCA disease.  Right to left collaterals from the RCA to the septal branches of the LAD. 2.  Moderately reduced LV systolic function with an EF of 30 to 35% with severe anterior and apical hypokinesis. 3.  Severe aortic calcification and mitral annular calcifications. 4. Severely elevated LVEDP. Recommendations: Difficult revascularization options.  The patient is not a good candidate for CABG given age, frailty and significant aortic valve calcifications. PCI of the left main is not straightforward given significant calcifications and the need for arthrectomy and possible mechanical support.  Will  obtain an echocardiogram and discuss with the patient and family.    Scheduled Meds: . atorvastatin  40 mg Oral QPM  . clopidogrel  75 mg Oral Daily  . diclofenac Sodium  2 g Topical QID  . ezetimibe  10 mg Oral Daily  . heparin injection (subcutaneous)  5,000 Units Subcutaneous Q8H  . isosorbide mononitrate  15 mg Oral Daily  . levothyroxine  100 mcg Oral Daily  . metoprolol tartrate  12.5 mg Oral BID  . pantoprazole  40 mg Oral Daily  . potassium chloride  10 mEq Oral Daily  . ranolazine  500 mg Oral BID  . sodium chloride flush  3 mL Intravenous Q12H   Continuous Infusions: . sodium chloride 10 mL/hr at 12/07/2019 1421    Assessment/Plan:  1. NSTEMI.  Cardiac catheterization yesterday  showed severe calcified left main disease and proximal RCA disease.  EF with catheterization on the lower side.  Echocardiogram done today and still pending.  Cardiology wants to do medical management at this time and continue Plavix, atorvastatin, low-dose metoprolol, ranolazine and low-dose indoor also added.  Cardiology recommended palliative care consultation for symptom management. 2. Relative hypotension.  I Cut back on dose of metoprolol this morning. 3. Hyperlipidemia unspecified on Lipitor and Zetia.  LDL 62 4. GERD on PPI 5. Hypothyroidism unspecified on levothyroxine 6. Weight loss.  May need further work-up as outpatient depending on clinical course. 7. Weakness.  Physical therapy evaluation 8. Right hand arthritis restarted diclofenac gel and Tylenol as needed      Code Status:     Code Status Orders  (From admission, onward)         Start     Ordered   11/23/2019 2334  Do not attempt resuscitation (DNR)  Continuous       Question Answer Comment  In the event of cardiac or respiratory ARREST Do not call a "code blue"   In the event of cardiac or respiratory ARREST Do not perform Intubation, CPR, defibrillation or ACLS   In the event of cardiac or respiratory ARREST Use  medication by any route, position, wound care, and other measures to relive pain and suffering. May use oxygen, suction and manual treatment of airway obstruction as needed for comfort.      11/29/2019 2333        Code Status History    Date Active Date Inactive Code Status Order ID Comments User Context   12/18/2019 2026 11/30/2019 2333 Full Code [id number]  Harvie Bridge, DO ED   10/25/2019 2231 10/28/2019 2236 DNR [id number]  Raiford Noble Puhi, DO Inpatient   09/10/2016 1945 09/12/2016 0007 Full Code [id number]  Dustin Flock, MD Inpatient   02/05/2013 1737 02/09/2013 1620 Full Code [id number]  Newman Pies, MD Inpatient   Advance Care Planning Activity    Advance Directive Documentation     Most Recent Value  Type of Advance Directive Out of facility DNR (pink MOST or yellow form)  Pre-existing out of facility DNR order (yellow form or pink MOST form) Yellow form placed in chart (order not valid for inpatient use)  "MOST" Form in Place? --     Family Communication: Allowed me to call the patient's daughter on the phone Disposition Plan: Status is: Inpatient  Dispo: The patient is from: Rehab              Anticipated d/c is to: Rehab              Anticipated d/c date is: Potentially 12/04/2019 depending on whether cardiology clears her to get out of the hospital and how she is feeling.              Patient currently being treated for NSTEMI with medical management.  Today's blood pressure is on the lower side.  Consultants:  Cardiology  Palliative care  Time spent: 28 minutes  [place]

## 2019-12-20 NOTE — Progress Notes (Signed)
  At 2148-09-06, Chaplain On-Call responded to Rapid Response notification. Medical Team was attempting to stabilize patient, as a prelude to potential comfort care status.  At Sep 06, 2213, Chaplain was recalled to Unit with report of patient actively dying. Chaplain provided prayer for patient at bedside, and observed that patient's breathing was slowing. Chaplain notified Nursing Staff members, who came in and monitored patient until her Time of Death at 09/07/50.  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  Fowler Allyanna Appleman M.Div., [facility]

## 2019-12-20 NOTE — Plan of Care (Signed)
PMT note:  Consult noted. Patient is sleeping in bedside chair with even and unlabored respirations. No family at bedside. Will reattempt GOC at another time.

## 2019-12-20 NOTE — Progress Notes (Signed)
*  PRELIMINARY RESULTS* Echocardiogram 2D Echocardiogram has been performed.  Sherrie Sport 12/12/19, 9:13 AM

## 2019-12-20 NOTE — Death Summary Note (Addendum)
DEATH SUMMARY   Patient Details  Name: Michele [name] MRN: [id number] DOB: 1928-01-14  Admission/Discharge Information   Admit Date:  12/17/19  Date of Death: Date of Death: 12-20-2019  Time of Death: Time of Death: 08-31-50  Length of Stay: 4  Referring Physician: Kirk Ruths, MD   Reason(s) for Hospitalization   Non ST elevation myocardial infarction  Diagnoses  Preliminary cause of death:  Secondary Diagnoses (including complications and co-morbidities):  Active Problems:   Non-ST elevation (NSTEMI) myocardial infarction [facility])   Chest pain, rule out acute myocardial infarction   Gastroesophageal reflux disease without esophagitis   Weight loss   Hypotension   Brief Hospital Course (including significant findings, care, treatment, and services provided and events leading to death)  Michele [name] is a 86 y.o. year old female with PMH, recent NSTEMI, HLD, HTN and mild MR/MS admitted with NSTEMI and dyspnea and found to have mild HS-TnI elevation and AKI.  She had previously declined catheterization but ultimately agreed to proceed with catheterization on 7/12 due to continued angina despite aggressive medical management.  She was found to have severe LMCA and RCA disease and significantly elevated LVEDP.  Patient was deemed a poor candidate for surgery as well as high risk PCI.  SIGNIFICANT EVENTS/STUDIES 7/10: Presented to the ED with chief complaints of mid retrosternal chest pain Dec 17, 2022: Admitted to progressive care unit and started on heparin drip 7/11: Cardiology consulted who recommended proceeding with cardiac cath.  Patient was continued on Plavix, statin, beta-blocker and Ranexa 7/12: Underwent left heart catheterization which showed severe calcified left main and proximal RCA disease without good revascularization options surgically or percutaneously with recommendation for medical management after discussion with the patient and daughter. 20-Dec-2022:  Palliative care consulted 7/14: Rapid response initiated for acute onset chest pain associated with diaphoresis nausea and vomiting.  Nitroglycerin not administered due to significant hypotension with blood pressure 50/37 mmHg.  Stat EKG obtained and IV fluid bolus administered.  EKG showed no acute changes. Patient was rapidly decompensating, given her DNR status discussed with patient's POA regarding aggressive management possible transfer to the ICU for hemodynamic stabilization and chest pain management.  Patient's daughter stated that patient would not like aggressive medical management at this time and would wish not to pursue any further interventions. Patient was transitioned to comfort care per daughter who is the POA. Patient passed away peacefully shortly prior to start of morphine drip.  Patient was pronounced at 2252 and patient's POA was contacted at time of death.  Pertinent Labs and Studies  Significant Diagnostic Studies CARDIAC CATHETERIZATION  Result Date: 11/23/2019  There is moderate left ventricular systolic dysfunction.  LV end diastolic pressure is severely elevated.  Prox RCA lesion is 80% stenosed.  Ost LM to Mid LM lesion is 95% stenosed.  Ost LAD to Prox LAD lesion is 40% stenosed.  1.  Severe calcified left main disease and proximal RCA disease.  Right to left collaterals from the RCA to the septal branches of the LAD. 2.  Moderately reduced LV systolic function with an EF of 30 to 35% with severe anterior and apical hypokinesis. 3.  Severe aortic calcification and mitral annular calcifications. 4. Severely elevated LVEDP. Recommendations: Difficult revascularization options.  The patient is not a good candidate for CABG given age, frailty and significant aortic valve calcifications. PCI of the left main is not straightforward given significant calcifications and the need for arthrectomy and possible mechanical support.  Will obtain an echocardiogram and  discuss with the  patient and family.   DG Chest Portable 1 View  Result Date: 12/18/2019 CLINICAL DATA:  Chest pain EXAM: PORTABLE CHEST 1 VIEW COMPARISON:  October 25, 2019 FINDINGS: No edema or airspace opacity. Heart is borderline enlarged with pulmonary vascularity normal. No adenopathy. There is aortic atherosclerosis. No bone lesions. IMPRESSION: No edema or airspace opacity. Heart borderline enlarged. Aortic Atherosclerosis (ICD10-I70.0). Electronically Signed   By: Lowella Grip III M.D.   On: 12/16/2019 16:47   ECHOCARDIOGRAM COMPLETE  Result Date: 13-Dec-2019    ECHOCARDIOGRAM REPORT   Patient Name:   Michele [name] [facility] Date of Exam: 12/13/2019 Medical Rec #:  [id number]                Height:       63.0 in Accession #:    [id number]               Weight:       119.2 lb Date of Birth:  1927/06/09                BSA:          1.552 m Patient Age:    32 years                 BP:           106/50 mmHg Patient Gender: F                        HR:           79 bpm. Exam Location:  [facility] Procedure: 2D Echo, Cardiac Doppler and Color Doppler Indications:     Acute myocardial infarction 410  History:         Patient has prior history of Echocardiogram examinations, most                  recent 10/26/2019. Risk Factors:Hypertension. NSTEMI.  Sonographer:     Sherrie Sport RDCS (AE) Referring Phys:  [id number] [facility] A ARIDA Diagnosing Phys: Nelva Bush MD IMPRESSIONS  1. Left ventricular ejection fraction, by estimation, is 55 to 60%. The left ventricle has normal function. The left ventricle has no regional wall motion abnormalities. There is mild left ventricular hypertrophy. Left ventricular diastolic parameters are consistent with Grade I diastolic dysfunction (impaired relaxation). Elevated left atrial pressure.  2. Right ventricular systolic function is normal. The right ventricular size is normal.  3. Left atrial size was borderline dilated.  4. The mitral valve is degenerative. Mild mitral valve regurgitation. Mild to  moderate mitral stenosis.  5. The aortic valve was not well visualized. Aortic valve regurgitation is not visualized. No aortic stenosis is present. FINDINGS  Left Ventricle: Left ventricular ejection fraction, by estimation, is 55 to 60%. The left ventricle has normal function. The left ventricle has no regional wall motion abnormalities. The left ventricular internal cavity size was normal in size. There is  mild left ventricular hypertrophy. Left ventricular diastolic parameters are consistent with Grade I diastolic dysfunction (impaired relaxation). Elevated left atrial pressure. Right Ventricle: The right ventricular size is normal. No increase in right ventricular wall thickness. Right ventricular systolic function is normal. Left Atrium: Left atrial size was borderline dilated. Right Atrium: Right atrial size was normal in size. Pericardium: There is no evidence of pericardial effusion. Mitral Valve: The mitral valve is degenerative in appearance. There is mild thickening of the mitral valve leaflet(s). Severe mitral annular calcification. Mild mitral  valve regurgitation. Mild to moderate mitral valve stenosis. The mean mitral valve gradient is 7.3 mmHg. Tricuspid Valve: The tricuspid valve is not well visualized. Tricuspid valve regurgitation is trivial. Aortic Valve: The aortic valve was not well visualized. Aortic valve regurgitation is not visualized. No aortic stenosis is present. Aortic valve mean gradient measures 3.5 mmHg. Aortic valve peak gradient measures 5.8 mmHg. Aortic valve area, by VTI measures 3.44 cm. Pulmonic Valve: The pulmonic valve was not well visualized. Pulmonic valve regurgitation is trivial. No evidence of pulmonic stenosis. Aorta: The aortic root is normal in size and structure. Pulmonary Artery: The pulmonary artery is not well seen. Venous: The inferior vena cava was not well visualized. IAS/Shunts: The interatrial septum was not well visualized.  LEFT VENTRICLE PLAX 2D LVIDd:          3.50 cm     Diastology LVIDs:         2.51 cm     LV e' lateral:   4.24 cm/s LV PW:         1.12 cm     LV E/e' lateral: 29.0 LV IVS:        1.19 cm     LV e' medial:    4.57 cm/s LVOT diam:     2.00 cm     LV E/e' medial:  26.9 LV SV:         99 LV SV Index:   64 LVOT Area:     3.14 cm  LV Volumes (MOD) LV vol d, MOD A2C: 47.7 ml LV vol d, MOD A4C: 65.6 ml LV vol s, MOD A2C: 20.1 ml LV vol s, MOD A4C: 26.0 ml LV SV MOD A2C:     27.6 ml LV SV MOD A4C:     65.6 ml LV SV MOD BP:      32.4 ml RIGHT VENTRICLE RV Basal diam:  2.06 cm RV S prime:     13.60 cm/s TAPSE (M-mode): 3.1 cm LEFT ATRIUM             Index       RIGHT ATRIUM           Index LA diam:        4.00 cm 2.58 cm/m  RA Area:     13.80 cm LA Vol (A2C):   37.6 ml 24.23 ml/m RA Volume:   33.00 ml  21.26 ml/m LA Vol (A4C):   59.6 ml 38.41 ml/m LA Biplane Vol: 50.7 ml 32.67 ml/m  AORTIC VALVE                   PULMONIC VALVE AV Area (Vmax):    2.89 cm    PV Vmax:        0.80 m/s AV Area (Vmean):   3.33 cm    PV Peak grad:   2.5 mmHg AV Area (VTI):     3.44 cm    RVOT Peak grad: 6 mmHg AV Vmax:           120.50 cm/s AV Vmean:          90.000 cm/s AV VTI:            0.288 m AV Peak Grad:      5.8 mmHg AV Mean Grad:      3.5 mmHg LVOT Vmax:         111.00 cm/s LVOT Vmean:        95.500 cm/s LVOT VTI:  0.315 m LVOT/AV VTI ratio: 1.10  AORTA Ao Root diam: 2.80 cm MITRAL VALVE                TRICUSPID VALVE MV Area (PHT): 2.52 cm     TR Peak grad:   28.5 mmHg MV Mean grad:  7.3 mmHg     TR Vmax:        267.00 cm/s MV Decel Time: 301 msec MV E velocity: 123.00 cm/s  SHUNTS MV A velocity: 168.00 cm/s  Systemic VTI:  0.32 m MV E/A ratio:  0.73         Systemic Diam: 2.00 cm Nelva Bush MD Electronically signed by Nelva Bush MD Signature Date/Time: 2019-12-26/1:26:20 PM    Final     Microbiology Recent Results (from the past 240 hour(s))  SARS Coronavirus 2 by RT PCR (hospital order, performed in [facility] hospital lab)  Nasopharyngeal Nasopharyngeal Swab     Status: None   Collection Time: 11/26/2019  6:03 PM   Specimen: Nasopharyngeal Swab  Result Value Ref Range Status   SARS Coronavirus 2 NEGATIVE NEGATIVE Final    Comment: (NOTE) SARS-CoV-2 target nucleic acids are NOT DETECTED.  The SARS-CoV-2 RNA is generally detectable in upper and lower respiratory specimens during the acute phase of infection. The lowest concentration of SARS-CoV-2 viral copies this assay can detect is 250 copies / mL. A negative result does not preclude SARS-CoV-2 infection and should not be used as the sole basis for treatment or other patient management decisions.  A negative result may occur with improper specimen collection / handling, submission of specimen other than nasopharyngeal swab, presence of viral mutation(s) within the areas targeted by this assay, and inadequate number of viral copies (<250 copies / mL). A negative result must be combined with clinical observations, patient history, and epidemiological information.  Fact Sheet for Patients:   StrictlyIdeas.no  Fact Sheet for Healthcare Providers: BankingDealers.co.za  This test is not yet approved or  cleared by the [place] FDA and has been authorized for detection and/or diagnosis of SARS-CoV-2 by FDA under an Emergency Use Authorization (EUA).  This EUA will remain in effect (meaning this test can be used) for the duration of the COVID-19 declaration under Section 564(b)(1) of the Act, 21 U.S.C. section 360bbb-3(b)(1), unless the authorization is terminated or revoked sooner.  Performed at [facility], [address] [place], [place] [address]   MRSA PCR Screening     Status: None   Collection Time: 12/26/2019  5:59 AM   Specimen: Nasopharyngeal  Result Value Ref Range Status   MRSA by PCR NEGATIVE NEGATIVE Final    Comment:        The GeneXpert MRSA Assay (FDA approved for NASAL  specimens only), is one component of a comprehensive MRSA colonization surveillance program. It is not intended to diagnose MRSA infection nor to guide or monitor treatment for MRSA infections. Performed at [facility], [address] [place], [place] [address]     Lab Basic Metabolic Panel: Recent Labs  Lab 12/10/2019 1628 11/29/19 0253 12-26-19 0650  NA 138 141 139  K 4.8 4.3 4.1  CL 103 105 104  CO2 26 30 24   GLUCOSE 101* 90 98  BUN 26* 20 20  CREATININE 1.05* 0.89 1.20*  CALCIUM 8.9 8.7* 8.4*   Liver Function Tests: Recent Labs  Lab 11/21/2019 1628  AST 24  ALT 17  ALKPHOS 59  BILITOT 0.8  PROT 7.3  ALBUMIN 4.3  No results for input(s): LIPASE, AMYLASE in the last 168 hours. No results for input(s): AMMONIA in the last 168 hours. CBC: Recent Labs  Lab 12/09/2019 1628 11/29/19 0253 12/14/2019 0247 12/21/19 0650  WBC 8.1 7.5 6.4 6.1  NEUTROABS 4.8  --   --   --   HGB 11.8* 11.9* 10.8* 10.6*  HCT 35.7* 34.9* 33.4* 31.7*  MCV 94.4 92.1 94.1 93.0  PLT 224 226 206 194   Cardiac Enzymes: No results for input(s): CKTOTAL, CKMB, CKMBINDEX, TROPONINI in the last 168 hours. Sepsis Labs: Recent Labs  Lab 12/11/2019 1628 11/29/19 0253 12/10/2019 0247 21-Dec-2019 0650  WBC 8.1 7.5 6.4 6.1    Procedures/Operations    Left heart cardiac cath 7/12     Rufina Falco, BSN, MSN, DNP, CCRN,FNP-C  [facility] Nurse Practitioner  [facility]   Addendum: Patient seen and examined in the morning on 12/07/2019.  Agree with Discharge summery by Rufina Falco. Dr Loletha Grayer

## 2019-12-20 NOTE — Consult Note (Signed)
ANTICOAGULATION CONSULT NOTE  Pharmacy Consult for Heparin  Indication: chest pain/ACS  Patient Measurements: Height: 5\' 3"  (160 cm) Weight: 54.1 kg (119 lb 3.2 oz) IBW/kg (Calculated) : 52.4 Heparin Dosing Weight: 56 kg  Vital Signs: Temp: 98.5 F (36.9 C) (07/13 0532) Temp Source: Oral (07/13 0532) BP: 95/45 (07/13 0532) Pulse Rate: 100 (07/12 1947)  Labs: Recent Labs    12/06/2019 1628 12/17/2019 1628 11/20/2019 1803 12/09/2019 2025 11/29/19 0253 11/29/19 0253 11/29/19 0942 11/29/19 1840 12/11/2019 0247 11/29/2019 0423 12-08-2019 0650  HGB 11.8*   < >  --   --  11.9*   < >  --   --  10.8*  --  10.6*  HCT 35.7*   < >  --   --  34.9*  --   --   --  33.4*  --  31.7*  PLT 224   < >  --   --  226  --   --   --  206  --  194  APTT  --   --   --  25  --   --   --   --   --   --   --   LABPROT  --   --   --  12.5 13.0  --   --   --   --   --   --   INR  --   --   --  1.0 1.0  --   --   --   --   --   --   HEPARINUNFRC  --   --   --   --   --   --   --  0.38  --  0.51 0.36  CREATININE 1.05*  --   --   --  0.89  --   --   --   --   --  1.20*  TROPONINIHS 314*  --    < > 258* 329*  --  259*  --   --   --   --    < > = values in this interval not displayed.    Estimated Creatinine Clearance: 25.8 mL/min (A) (by C-G formula based on SCr of 1.2 mg/dL (H)).   Medical History: Past Medical History:  Diagnosis Date  . Anemia    yrs ago  . Arthritis   . Back pain   . Carotid bruit    right side more so than left;but per pt not enough to clean out  . Constipation    metamucil bid  . Diverticulosis    right  . GERD (gastroesophageal reflux disease)    related to some meds;but doesn't take any medications  . History of migraine    stopped with menopause  . Hyperlipidemia    takes crestor daily  . Hypertension    takes Lisinopril daily  . Hypothyroidism    takes Synthroid daily  . Internal hemorrhoids   . Joint pain   . Joint swelling   . Macular degeneration    dry  .  Nocturia   . NSTEMI (non-ST elevated myocardial infarction) ([place]) 07/25/2019  . Numbness    left foot  . Osteopenia   . Phlebitis    hx of-49yrs ago right leg;takes Plavix daily but on hold for surgery  . PONV (postoperative nausea and vomiting)   . Urinary frequency   . Urinary urgency     Medications:  Medications Prior to Admission  Medication Sig Dispense  Refill Last Dose  . acetaminophen (TYLENOL) 500 MG tablet Take 500 mg by mouth every 4 (four) hours as needed for mild pain or fever.   12/07/2019 at [facility]  . alum & mag hydroxide-simeth (MAALOX PLUS) 400-400-40 MG/5ML suspension Take 30 mLs by mouth every 4 (four) hours as needed for indigestion.   Unknown at PRN  . atorvastatin (LIPITOR) 40 MG tablet Take 1 tablet (40 mg total) by mouth every evening. 30 tablet 0 11/27/2019 at [place]  . bisacodyl (DULCOLAX) 10 MG suppository Place 10 mg rectally as needed for moderate constipation.   6+ months at PRN  . calcium carbonate (TUMS - DOSED IN MG ELEMENTAL CALCIUM) 500 MG chewable tablet Chew 2 tablets by mouth every 2 (two) hours as needed for indigestion or heartburn.    Unknown at PRN  . clopidogrel (PLAVIX) 75 MG tablet Take 1 tablet (75 mg total) by mouth daily. 30 tablet 0 11/29/2019 at 0730  . ezetimibe (ZETIA) 10 MG tablet Take 1 tablet (10 mg total) by mouth daily. 90 tablet 3 11/22/2019 at Unknown time  . famotidine (PEPCID) 20 MG tablet Take 20 mg by mouth 2 (two) times daily as needed for heartburn.    11/23/2019 at PRN  . KLOR-CON M10 10 MEQ tablet Take 10 mEq by mouth daily.   12/06/2019 at 0730  . Krill Oil 350 MG CAPS Take 350 mg by mouth daily.    12/05/2019 at 0730  . Lidocaine 4 % PTCH Apply 1 patch topically daily as needed (hip pain).    Unknown at PRN  . MegaRed Omega-3 Krill Oil 350 MG CAPS Take 350 mg by mouth daily.      . Methylcellulose, Laxative, (CITRUCEL) 500 MG TABS Take 500 mg by mouth daily as needed (constipation).    Unknown at PRN  . metoprolol tartrate (LOPRESSOR)  25 MG tablet Take 1 tablet (25 mg total) by mouth 2 (two) times daily. 60 tablet 0 11/27/2019 at 0745  . Multiple Vitamins-Minerals (PRESERVISION AREDS 2 PO) Take 1 capsule by mouth 2 (two) times daily.     . ondansetron (ZOFRAN) 4 MG tablet Take 4 mg by mouth every 6 (six) hours as needed for nausea or vomiting.   Unknown at PRN  . pantoprazole (PROTONIX) 40 MG tablet Take 40 mg by mouth daily.   12/08/2019 at 0615  . ranolazine (RANEXA) 500 MG 12 hr tablet Take 1 tablet (500 mg total) by mouth 2 (two) times daily. 60 tablet 0 11/30/2019 at 0730  . SYNTHROID 100 MCG tablet Take 100 mcg by mouth daily.   12/13/2019 at 0615  . nitroGLYCERIN (NITROSTAT) 0.4 MG SL tablet Place 0.4 mg under the tongue every 5 (five) minutes as needed for chest pain.   Unknown at PRN   Scheduled:  . atorvastatin  40 mg Oral QPM  . clopidogrel  75 mg Oral Daily  . ezetimibe  10 mg Oral Daily  . levothyroxine  100 mcg Oral Daily  . metoprolol tartrate  12.5 mg Oral BID  . pantoprazole  40 mg Oral Daily  . potassium chloride  10 mEq Oral Daily  . ranolazine  500 mg Oral BID  . sodium chloride flush  3 mL Intravenous Q12H   Infusions:  . sodium chloride 10 mL/hr at 11/27/2019 1421  . heparin 650 Units/hr (11/22/2019 2215)  . nitroGLYCERIN 10 mcg/min (11/30/19 1810)   PRN: sodium chloride, acetaminophen, alum & mag hydroxide-simeth, bisacodyl, calcium carbonate, famotidine, nitroGLYCERIN, ondansetron (ZOFRAN) IV, psyllium,  sodium chloride flush Anti-infectives (From admission, onward)   None      Assessment: 86 y.o. female with a history of carotid artery disease, recent NSTEMI, HLD, HTN, mild MR/MS who was seen for the evaluation of chest pain and troponin elevation. Pharmacy was consulted for starting a heparin infusion. She was recently admitted(last month) for NSTEMI but opted for medical management rather than catheterization.  Patient has no prior history of anticoagulant use.   7/11 1840 HL 0.38 7/12 0423 HL  0.51 7/13 0650 HL 0.36   Goal of Therapy:  Anti-Xa 0.3 - 0.7 IU/mL Monitor platelets by anticoagulation protocol: Yes   Plan:  Heparin level is therapeutic. Will continue current rate at 650 units/hr. Will recheck anti-xa and CBC with AM labs.   Oswald Hillock, PharmD, BCPS 12/30/2019,7:40 AM

## 2019-12-20 NOTE — Progress Notes (Signed)
Pt complained of CP 2200. Nurse came in with Nitro in hand. Went to ask the patient about the level of chest pain found the patient to be slow to response and diaphoretic. Called the Charge Nurse and called a rapid. During the rapid Pt. BP was 50/37. Fluids were given. NP called cardiology and family. Daughter  Was okay with making her comfort care.  Asked if she wanted to come up here to be with her mother. Daughter stated " no. Just tell her that we love her." RN pronounced at August 15, 2250. Nurse called daughter again to let her know the turn of events. Daughter stated nonchalantly "I know. Im not coming up there. Im not going to wake my father up for this I will call tomorrow to tell him." Nurse let her know that her mothers belongings will be at the front desk for her to pick. Daughter agreed she would come pick them up in the morning. [place] [facility] called.

## 2019-12-20 DEATH — deceased
